# Patient Record
Sex: Female | Born: 1955 | Race: White | Hispanic: No | Smoking: Former smoker
Health system: Southern US, Community
[De-identification: ages and names within clinical notes are randomized; demographics above are authoritative.]

## PROBLEM LIST (undated history)

## (undated) DIAGNOSIS — D696 Thrombocytopenia, unspecified: Secondary | ICD-10-CM

## (undated) DIAGNOSIS — G4730 Sleep apnea, unspecified: Secondary | ICD-10-CM

## (undated) DIAGNOSIS — F32A Depression, unspecified: Secondary | ICD-10-CM

## (undated) DIAGNOSIS — M1990 Unspecified osteoarthritis, unspecified site: Secondary | ICD-10-CM

## (undated) DIAGNOSIS — N179 Acute kidney failure, unspecified: Secondary | ICD-10-CM

## (undated) DIAGNOSIS — D649 Anemia, unspecified: Secondary | ICD-10-CM

## (undated) DIAGNOSIS — F419 Anxiety disorder, unspecified: Secondary | ICD-10-CM

## (undated) DIAGNOSIS — Z87442 Personal history of urinary calculi: Secondary | ICD-10-CM

## (undated) DIAGNOSIS — I10 Essential (primary) hypertension: Secondary | ICD-10-CM

## (undated) DIAGNOSIS — J45909 Unspecified asthma, uncomplicated: Secondary | ICD-10-CM

## (undated) DIAGNOSIS — E871 Hypo-osmolality and hyponatremia: Secondary | ICD-10-CM

## (undated) DIAGNOSIS — E669 Obesity, unspecified: Secondary | ICD-10-CM

## (undated) DIAGNOSIS — N183 Chronic kidney disease, stage 3 unspecified: Secondary | ICD-10-CM

## (undated) DIAGNOSIS — F329 Major depressive disorder, single episode, unspecified: Secondary | ICD-10-CM

## (undated) DIAGNOSIS — J449 Chronic obstructive pulmonary disease, unspecified: Secondary | ICD-10-CM

## (undated) DIAGNOSIS — C801 Malignant (primary) neoplasm, unspecified: Secondary | ICD-10-CM

## (undated) DIAGNOSIS — R0600 Dyspnea, unspecified: Secondary | ICD-10-CM

## (undated) HISTORY — PX: ACHILLES TENDON REPAIR: SUR1153

## (undated) HISTORY — PX: JOINT REPLACEMENT: SHX530

## (undated) HISTORY — DX: Unspecified asthma, uncomplicated: J45.909

## (undated) HISTORY — PX: ABDOMINAL HYSTERECTOMY: SHX81

## (undated) HISTORY — PX: GASTRIC BYPASS: SHX52

---
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
## 1986-05-01 HISTORY — PX: ABDOMINAL HYSTERECTOMY: SHX81

## 2003-05-02 HISTORY — PX: FRACTURE SURGERY: SHX138

## 2005-05-01 HISTORY — PX: CARPAL TUNNEL RELEASE: SHX101

## 2009-10-19 ENCOUNTER — Emergency Department: Payer: Self-pay | Admitting: Emergency Medicine

## 2009-12-13 ENCOUNTER — Ambulatory Visit: Payer: Self-pay

## 2010-01-04 ENCOUNTER — Ambulatory Visit: Payer: Self-pay | Admitting: Pain Medicine

## 2010-01-17 ENCOUNTER — Ambulatory Visit: Payer: Self-pay | Admitting: Pain Medicine

## 2010-01-25 ENCOUNTER — Ambulatory Visit: Payer: Self-pay | Admitting: Pain Medicine

## 2010-02-08 ENCOUNTER — Ambulatory Visit: Payer: Self-pay | Admitting: Pain Medicine

## 2010-04-05 ENCOUNTER — Emergency Department (HOSPITAL_COMMUNITY)
Admission: EM | Admit: 2010-04-05 | Discharge: 2010-04-05 | Payer: Self-pay | Source: Home / Self Care | Admitting: Emergency Medicine

## 2010-04-19 ENCOUNTER — Ambulatory Visit: Payer: Self-pay

## 2010-06-14 ENCOUNTER — Ambulatory Visit: Payer: Self-pay | Admitting: Pain Medicine

## 2010-08-01 ENCOUNTER — Ambulatory Visit: Payer: Self-pay | Admitting: Pain Medicine

## 2010-08-05 ENCOUNTER — Inpatient Hospital Stay: Payer: Self-pay | Admitting: Psychiatry

## 2011-05-09 DIAGNOSIS — Z79899 Other long term (current) drug therapy: Secondary | ICD-10-CM | POA: Diagnosis not present

## 2011-05-09 DIAGNOSIS — Z6841 Body Mass Index (BMI) 40.0 and over, adult: Secondary | ICD-10-CM | POA: Diagnosis not present

## 2011-05-09 DIAGNOSIS — S8000XA Contusion of unspecified knee, initial encounter: Secondary | ICD-10-CM | POA: Diagnosis not present

## 2011-05-09 DIAGNOSIS — R5381 Other malaise: Secondary | ICD-10-CM | POA: Diagnosis not present

## 2011-05-24 DIAGNOSIS — R6884 Jaw pain: Secondary | ICD-10-CM | POA: Diagnosis not present

## 2011-05-24 DIAGNOSIS — K089 Disorder of teeth and supporting structures, unspecified: Secondary | ICD-10-CM | POA: Diagnosis not present

## 2011-05-25 DIAGNOSIS — M25569 Pain in unspecified knee: Secondary | ICD-10-CM | POA: Diagnosis not present

## 2011-05-25 DIAGNOSIS — M1710 Unilateral primary osteoarthritis, unspecified knee: Secondary | ICD-10-CM | POA: Diagnosis not present

## 2011-05-25 DIAGNOSIS — Z6841 Body Mass Index (BMI) 40.0 and over, adult: Secondary | ICD-10-CM | POA: Diagnosis not present

## 2011-05-30 DIAGNOSIS — L280 Lichen simplex chronicus: Secondary | ICD-10-CM | POA: Diagnosis not present

## 2011-05-30 DIAGNOSIS — L219 Seborrheic dermatitis, unspecified: Secondary | ICD-10-CM | POA: Diagnosis not present

## 2011-06-08 DIAGNOSIS — F331 Major depressive disorder, recurrent, moderate: Secondary | ICD-10-CM | POA: Diagnosis not present

## 2013-02-06 DIAGNOSIS — F312 Bipolar disorder, current episode manic severe with psychotic features: Secondary | ICD-10-CM | POA: Diagnosis not present

## 2013-02-17 DIAGNOSIS — F312 Bipolar disorder, current episode manic severe with psychotic features: Secondary | ICD-10-CM | POA: Diagnosis not present

## 2013-02-20 DIAGNOSIS — Z23 Encounter for immunization: Secondary | ICD-10-CM | POA: Diagnosis not present

## 2013-02-28 DIAGNOSIS — F319 Bipolar disorder, unspecified: Secondary | ICD-10-CM | POA: Diagnosis not present

## 2013-02-28 DIAGNOSIS — Z79899 Other long term (current) drug therapy: Secondary | ICD-10-CM | POA: Diagnosis not present

## 2013-03-17 DIAGNOSIS — F3132 Bipolar disorder, current episode depressed, moderate: Secondary | ICD-10-CM | POA: Diagnosis not present

## 2013-03-17 DIAGNOSIS — F312 Bipolar disorder, current episode manic severe with psychotic features: Secondary | ICD-10-CM | POA: Diagnosis not present

## 2013-05-12 DIAGNOSIS — F3132 Bipolar disorder, current episode depressed, moderate: Secondary | ICD-10-CM | POA: Diagnosis not present

## 2013-05-12 DIAGNOSIS — F312 Bipolar disorder, current episode manic severe with psychotic features: Secondary | ICD-10-CM | POA: Diagnosis not present

## 2013-05-14 DIAGNOSIS — H40019 Open angle with borderline findings, low risk, unspecified eye: Secondary | ICD-10-CM | POA: Diagnosis not present

## 2013-05-20 DIAGNOSIS — J111 Influenza due to unidentified influenza virus with other respiratory manifestations: Secondary | ICD-10-CM | POA: Diagnosis not present

## 2013-05-20 DIAGNOSIS — M13869 Other specified arthritis, unspecified knee: Secondary | ICD-10-CM | POA: Diagnosis not present

## 2013-06-05 DIAGNOSIS — E119 Type 2 diabetes mellitus without complications: Secondary | ICD-10-CM | POA: Diagnosis not present

## 2013-06-05 DIAGNOSIS — IMO0002 Reserved for concepts with insufficient information to code with codable children: Secondary | ICD-10-CM | POA: Diagnosis not present

## 2013-06-05 DIAGNOSIS — F3130 Bipolar disorder, current episode depressed, mild or moderate severity, unspecified: Secondary | ICD-10-CM | POA: Diagnosis not present

## 2013-06-05 DIAGNOSIS — J45909 Unspecified asthma, uncomplicated: Secondary | ICD-10-CM | POA: Diagnosis not present

## 2013-06-05 DIAGNOSIS — E559 Vitamin D deficiency, unspecified: Secondary | ICD-10-CM | POA: Diagnosis not present

## 2013-06-30 DIAGNOSIS — F3132 Bipolar disorder, current episode depressed, moderate: Secondary | ICD-10-CM | POA: Diagnosis not present

## 2013-06-30 DIAGNOSIS — F312 Bipolar disorder, current episode manic severe with psychotic features: Secondary | ICD-10-CM | POA: Diagnosis not present

## 2013-07-03 DIAGNOSIS — F3130 Bipolar disorder, current episode depressed, mild or moderate severity, unspecified: Secondary | ICD-10-CM | POA: Diagnosis not present

## 2013-07-03 DIAGNOSIS — M549 Dorsalgia, unspecified: Secondary | ICD-10-CM | POA: Diagnosis not present

## 2013-07-03 DIAGNOSIS — E119 Type 2 diabetes mellitus without complications: Secondary | ICD-10-CM | POA: Diagnosis not present

## 2013-07-03 DIAGNOSIS — J45909 Unspecified asthma, uncomplicated: Secondary | ICD-10-CM | POA: Diagnosis not present

## 2013-07-15 DIAGNOSIS — F3132 Bipolar disorder, current episode depressed, moderate: Secondary | ICD-10-CM | POA: Diagnosis not present

## 2013-07-15 DIAGNOSIS — F312 Bipolar disorder, current episode manic severe with psychotic features: Secondary | ICD-10-CM | POA: Diagnosis not present

## 2013-07-22 DIAGNOSIS — F3132 Bipolar disorder, current episode depressed, moderate: Secondary | ICD-10-CM | POA: Diagnosis not present

## 2013-07-22 DIAGNOSIS — F312 Bipolar disorder, current episode manic severe with psychotic features: Secondary | ICD-10-CM | POA: Diagnosis not present

## 2013-08-06 DIAGNOSIS — H40019 Open angle with borderline findings, low risk, unspecified eye: Secondary | ICD-10-CM | POA: Diagnosis not present

## 2013-08-15 DIAGNOSIS — F312 Bipolar disorder, current episode manic severe with psychotic features: Secondary | ICD-10-CM | POA: Diagnosis not present

## 2013-09-01 DIAGNOSIS — IMO0002 Reserved for concepts with insufficient information to code with codable children: Secondary | ICD-10-CM | POA: Diagnosis not present

## 2013-09-01 DIAGNOSIS — F3130 Bipolar disorder, current episode depressed, mild or moderate severity, unspecified: Secondary | ICD-10-CM | POA: Diagnosis not present

## 2013-09-01 DIAGNOSIS — M549 Dorsalgia, unspecified: Secondary | ICD-10-CM | POA: Diagnosis not present

## 2013-09-01 DIAGNOSIS — Z79899 Other long term (current) drug therapy: Secondary | ICD-10-CM | POA: Diagnosis not present

## 2013-09-02 DIAGNOSIS — F312 Bipolar disorder, current episode manic severe with psychotic features: Secondary | ICD-10-CM | POA: Diagnosis not present

## 2013-09-03 DIAGNOSIS — F319 Bipolar disorder, unspecified: Secondary | ICD-10-CM | POA: Diagnosis not present

## 2013-09-17 DIAGNOSIS — F312 Bipolar disorder, current episode manic severe with psychotic features: Secondary | ICD-10-CM | POA: Diagnosis not present

## 2013-10-22 DIAGNOSIS — F3130 Bipolar disorder, current episode depressed, mild or moderate severity, unspecified: Secondary | ICD-10-CM | POA: Diagnosis not present

## 2013-10-29 DIAGNOSIS — F3130 Bipolar disorder, current episode depressed, mild or moderate severity, unspecified: Secondary | ICD-10-CM | POA: Diagnosis not present

## 2013-11-04 DIAGNOSIS — M129 Arthropathy, unspecified: Secondary | ICD-10-CM | POA: Diagnosis not present

## 2013-11-04 DIAGNOSIS — F319 Bipolar disorder, unspecified: Secondary | ICD-10-CM | POA: Diagnosis not present

## 2013-11-04 DIAGNOSIS — R5381 Other malaise: Secondary | ICD-10-CM | POA: Diagnosis not present

## 2013-11-04 DIAGNOSIS — J45909 Unspecified asthma, uncomplicated: Secondary | ICD-10-CM | POA: Diagnosis not present

## 2013-11-04 DIAGNOSIS — G8929 Other chronic pain: Secondary | ICD-10-CM | POA: Diagnosis not present

## 2013-11-04 DIAGNOSIS — R5383 Other fatigue: Secondary | ICD-10-CM | POA: Diagnosis not present

## 2013-11-05 DIAGNOSIS — F3130 Bipolar disorder, current episode depressed, mild or moderate severity, unspecified: Secondary | ICD-10-CM | POA: Diagnosis not present

## 2013-11-07 DIAGNOSIS — R609 Edema, unspecified: Secondary | ICD-10-CM | POA: Diagnosis not present

## 2013-11-07 DIAGNOSIS — M7989 Other specified soft tissue disorders: Secondary | ICD-10-CM | POA: Diagnosis not present

## 2013-11-12 DIAGNOSIS — F3130 Bipolar disorder, current episode depressed, mild or moderate severity, unspecified: Secondary | ICD-10-CM | POA: Diagnosis not present

## 2013-11-19 DIAGNOSIS — F3130 Bipolar disorder, current episode depressed, mild or moderate severity, unspecified: Secondary | ICD-10-CM | POA: Diagnosis not present

## 2013-11-25 DIAGNOSIS — R0602 Shortness of breath: Secondary | ICD-10-CM | POA: Diagnosis not present

## 2013-11-25 DIAGNOSIS — R5383 Other fatigue: Secondary | ICD-10-CM | POA: Diagnosis not present

## 2013-11-25 DIAGNOSIS — Z79899 Other long term (current) drug therapy: Secondary | ICD-10-CM | POA: Diagnosis not present

## 2013-11-25 DIAGNOSIS — R5381 Other malaise: Secondary | ICD-10-CM | POA: Diagnosis not present

## 2013-11-25 DIAGNOSIS — G8929 Other chronic pain: Secondary | ICD-10-CM | POA: Diagnosis not present

## 2013-11-26 DIAGNOSIS — F3130 Bipolar disorder, current episode depressed, mild or moderate severity, unspecified: Secondary | ICD-10-CM | POA: Diagnosis not present

## 2013-12-05 DIAGNOSIS — M259 Joint disorder, unspecified: Secondary | ICD-10-CM | POA: Diagnosis not present

## 2013-12-12 DIAGNOSIS — S79929A Unspecified injury of unspecified thigh, initial encounter: Secondary | ICD-10-CM | POA: Diagnosis not present

## 2013-12-12 DIAGNOSIS — M7989 Other specified soft tissue disorders: Secondary | ICD-10-CM | POA: Diagnosis not present

## 2013-12-12 DIAGNOSIS — W010XXA Fall on same level from slipping, tripping and stumbling without subsequent striking against object, initial encounter: Secondary | ICD-10-CM | POA: Diagnosis not present

## 2013-12-12 DIAGNOSIS — M259 Joint disorder, unspecified: Secondary | ICD-10-CM | POA: Diagnosis not present

## 2013-12-12 DIAGNOSIS — S8000XA Contusion of unspecified knee, initial encounter: Secondary | ICD-10-CM | POA: Diagnosis not present

## 2013-12-12 DIAGNOSIS — Z96659 Presence of unspecified artificial knee joint: Secondary | ICD-10-CM | POA: Diagnosis not present

## 2013-12-12 DIAGNOSIS — M25559 Pain in unspecified hip: Secondary | ICD-10-CM | POA: Diagnosis not present

## 2013-12-12 DIAGNOSIS — S79919A Unspecified injury of unspecified hip, initial encounter: Secondary | ICD-10-CM | POA: Diagnosis not present

## 2013-12-12 DIAGNOSIS — M25579 Pain in unspecified ankle and joints of unspecified foot: Secondary | ICD-10-CM | POA: Diagnosis not present

## 2013-12-12 DIAGNOSIS — M25569 Pain in unspecified knee: Secondary | ICD-10-CM | POA: Diagnosis not present

## 2013-12-12 DIAGNOSIS — S8990XA Unspecified injury of unspecified lower leg, initial encounter: Secondary | ICD-10-CM | POA: Diagnosis not present

## 2013-12-12 DIAGNOSIS — S99919A Unspecified injury of unspecified ankle, initial encounter: Secondary | ICD-10-CM | POA: Diagnosis not present

## 2013-12-15 DIAGNOSIS — M7660 Achilles tendinitis, unspecified leg: Secondary | ICD-10-CM | POA: Diagnosis not present

## 2013-12-15 DIAGNOSIS — M259 Joint disorder, unspecified: Secondary | ICD-10-CM | POA: Diagnosis not present

## 2013-12-18 DIAGNOSIS — F3130 Bipolar disorder, current episode depressed, mild or moderate severity, unspecified: Secondary | ICD-10-CM | POA: Diagnosis not present

## 2013-12-23 DIAGNOSIS — G4710 Hypersomnia, unspecified: Secondary | ICD-10-CM | POA: Diagnosis not present

## 2013-12-23 DIAGNOSIS — M7660 Achilles tendinitis, unspecified leg: Secondary | ICD-10-CM | POA: Diagnosis not present

## 2013-12-23 DIAGNOSIS — G8929 Other chronic pain: Secondary | ICD-10-CM | POA: Diagnosis not present

## 2013-12-23 DIAGNOSIS — R5383 Other fatigue: Secondary | ICD-10-CM | POA: Diagnosis not present

## 2013-12-23 DIAGNOSIS — R5381 Other malaise: Secondary | ICD-10-CM | POA: Diagnosis not present

## 2013-12-23 DIAGNOSIS — N289 Disorder of kidney and ureter, unspecified: Secondary | ICD-10-CM | POA: Diagnosis not present

## 2013-12-26 DIAGNOSIS — M949 Disorder of cartilage, unspecified: Secondary | ICD-10-CM | POA: Diagnosis not present

## 2013-12-26 DIAGNOSIS — Z1231 Encounter for screening mammogram for malignant neoplasm of breast: Secondary | ICD-10-CM | POA: Diagnosis not present

## 2013-12-26 DIAGNOSIS — N951 Menopausal and female climacteric states: Secondary | ICD-10-CM | POA: Diagnosis not present

## 2013-12-26 DIAGNOSIS — M899 Disorder of bone, unspecified: Secondary | ICD-10-CM | POA: Diagnosis not present

## 2013-12-27 DIAGNOSIS — G4730 Sleep apnea, unspecified: Secondary | ICD-10-CM | POA: Diagnosis not present

## 2013-12-27 DIAGNOSIS — G4710 Hypersomnia, unspecified: Secondary | ICD-10-CM | POA: Diagnosis not present

## 2013-12-30 DIAGNOSIS — M7660 Achilles tendinitis, unspecified leg: Secondary | ICD-10-CM | POA: Diagnosis not present

## 2014-01-01 DIAGNOSIS — M7660 Achilles tendinitis, unspecified leg: Secondary | ICD-10-CM | POA: Diagnosis not present

## 2014-01-02 DIAGNOSIS — J069 Acute upper respiratory infection, unspecified: Secondary | ICD-10-CM | POA: Diagnosis not present

## 2014-01-02 DIAGNOSIS — R05 Cough: Secondary | ICD-10-CM | POA: Diagnosis not present

## 2014-01-02 DIAGNOSIS — G4730 Sleep apnea, unspecified: Secondary | ICD-10-CM | POA: Diagnosis not present

## 2014-01-02 DIAGNOSIS — R059 Cough, unspecified: Secondary | ICD-10-CM | POA: Diagnosis not present

## 2014-01-08 DIAGNOSIS — M7660 Achilles tendinitis, unspecified leg: Secondary | ICD-10-CM | POA: Diagnosis not present

## 2014-01-13 DIAGNOSIS — M7660 Achilles tendinitis, unspecified leg: Secondary | ICD-10-CM | POA: Diagnosis not present

## 2014-01-15 DIAGNOSIS — M7660 Achilles tendinitis, unspecified leg: Secondary | ICD-10-CM | POA: Diagnosis not present

## 2014-01-20 DIAGNOSIS — M7660 Achilles tendinitis, unspecified leg: Secondary | ICD-10-CM | POA: Diagnosis not present

## 2014-01-22 DIAGNOSIS — M7660 Achilles tendinitis, unspecified leg: Secondary | ICD-10-CM | POA: Diagnosis not present

## 2014-01-22 DIAGNOSIS — M25579 Pain in unspecified ankle and joints of unspecified foot: Secondary | ICD-10-CM | POA: Diagnosis not present

## 2014-01-23 DIAGNOSIS — Z79899 Other long term (current) drug therapy: Secondary | ICD-10-CM | POA: Diagnosis not present

## 2014-01-23 DIAGNOSIS — G8929 Other chronic pain: Secondary | ICD-10-CM | POA: Diagnosis not present

## 2014-01-23 DIAGNOSIS — Z6841 Body Mass Index (BMI) 40.0 and over, adult: Secondary | ICD-10-CM | POA: Diagnosis not present

## 2014-01-23 DIAGNOSIS — G4730 Sleep apnea, unspecified: Secondary | ICD-10-CM | POA: Diagnosis not present

## 2014-01-27 DIAGNOSIS — M7660 Achilles tendinitis, unspecified leg: Secondary | ICD-10-CM | POA: Diagnosis not present

## 2014-01-27 DIAGNOSIS — M25579 Pain in unspecified ankle and joints of unspecified foot: Secondary | ICD-10-CM | POA: Diagnosis not present

## 2014-01-29 DIAGNOSIS — R2241 Localized swelling, mass and lump, right lower limb: Secondary | ICD-10-CM | POA: Diagnosis not present

## 2014-01-29 DIAGNOSIS — M7661 Achilles tendinitis, right leg: Secondary | ICD-10-CM | POA: Diagnosis not present

## 2014-01-29 DIAGNOSIS — M25571 Pain in right ankle and joints of right foot: Secondary | ICD-10-CM | POA: Diagnosis not present

## 2014-02-03 DIAGNOSIS — M25571 Pain in right ankle and joints of right foot: Secondary | ICD-10-CM | POA: Diagnosis not present

## 2014-02-03 DIAGNOSIS — M7661 Achilles tendinitis, right leg: Secondary | ICD-10-CM | POA: Diagnosis not present

## 2014-02-05 DIAGNOSIS — M7661 Achilles tendinitis, right leg: Secondary | ICD-10-CM | POA: Diagnosis not present

## 2014-02-05 DIAGNOSIS — M25571 Pain in right ankle and joints of right foot: Secondary | ICD-10-CM | POA: Diagnosis not present

## 2014-02-10 DIAGNOSIS — H2513 Age-related nuclear cataract, bilateral: Secondary | ICD-10-CM | POA: Diagnosis not present

## 2014-02-11 DIAGNOSIS — S8011XA Contusion of right lower leg, initial encounter: Secondary | ICD-10-CM | POA: Diagnosis not present

## 2014-02-11 DIAGNOSIS — M79604 Pain in right leg: Secondary | ICD-10-CM | POA: Diagnosis not present

## 2014-02-11 DIAGNOSIS — W010XXA Fall on same level from slipping, tripping and stumbling without subsequent striking against object, initial encounter: Secondary | ICD-10-CM | POA: Diagnosis not present

## 2014-02-11 DIAGNOSIS — S8991XA Unspecified injury of right lower leg, initial encounter: Secondary | ICD-10-CM | POA: Diagnosis not present

## 2014-02-11 DIAGNOSIS — R262 Difficulty in walking, not elsewhere classified: Secondary | ICD-10-CM | POA: Diagnosis not present

## 2014-02-11 DIAGNOSIS — M7660 Achilles tendinitis, unspecified leg: Secondary | ICD-10-CM | POA: Diagnosis not present

## 2014-02-12 DIAGNOSIS — M7661 Achilles tendinitis, right leg: Secondary | ICD-10-CM | POA: Diagnosis not present

## 2014-02-12 DIAGNOSIS — M25571 Pain in right ankle and joints of right foot: Secondary | ICD-10-CM | POA: Diagnosis not present

## 2014-02-16 DIAGNOSIS — M25571 Pain in right ankle and joints of right foot: Secondary | ICD-10-CM | POA: Diagnosis not present

## 2014-02-16 DIAGNOSIS — M7661 Achilles tendinitis, right leg: Secondary | ICD-10-CM | POA: Diagnosis not present

## 2014-02-18 DIAGNOSIS — M7661 Achilles tendinitis, right leg: Secondary | ICD-10-CM | POA: Diagnosis not present

## 2014-02-18 DIAGNOSIS — M25571 Pain in right ankle and joints of right foot: Secondary | ICD-10-CM | POA: Diagnosis not present

## 2014-02-23 DIAGNOSIS — F319 Bipolar disorder, unspecified: Secondary | ICD-10-CM | POA: Diagnosis not present

## 2014-02-24 DIAGNOSIS — M25571 Pain in right ankle and joints of right foot: Secondary | ICD-10-CM | POA: Diagnosis not present

## 2014-02-24 DIAGNOSIS — M7661 Achilles tendinitis, right leg: Secondary | ICD-10-CM | POA: Diagnosis not present

## 2014-02-25 DIAGNOSIS — Z6841 Body Mass Index (BMI) 40.0 and over, adult: Secondary | ICD-10-CM | POA: Diagnosis not present

## 2014-02-25 DIAGNOSIS — Z23 Encounter for immunization: Secondary | ICD-10-CM | POA: Diagnosis not present

## 2014-02-25 DIAGNOSIS — Z79899 Other long term (current) drug therapy: Secondary | ICD-10-CM | POA: Diagnosis not present

## 2014-02-25 DIAGNOSIS — G4730 Sleep apnea, unspecified: Secondary | ICD-10-CM | POA: Diagnosis not present

## 2014-02-25 DIAGNOSIS — I10 Essential (primary) hypertension: Secondary | ICD-10-CM | POA: Diagnosis not present

## 2014-02-25 DIAGNOSIS — M1990 Unspecified osteoarthritis, unspecified site: Secondary | ICD-10-CM | POA: Diagnosis not present

## 2014-02-26 DIAGNOSIS — M25571 Pain in right ankle and joints of right foot: Secondary | ICD-10-CM | POA: Diagnosis not present

## 2014-02-26 DIAGNOSIS — M7661 Achilles tendinitis, right leg: Secondary | ICD-10-CM | POA: Diagnosis not present

## 2014-03-02 DIAGNOSIS — M7661 Achilles tendinitis, right leg: Secondary | ICD-10-CM | POA: Diagnosis not present

## 2014-03-02 DIAGNOSIS — M25571 Pain in right ankle and joints of right foot: Secondary | ICD-10-CM | POA: Diagnosis not present

## 2014-03-05 DIAGNOSIS — M25571 Pain in right ankle and joints of right foot: Secondary | ICD-10-CM | POA: Diagnosis not present

## 2014-03-05 DIAGNOSIS — M7661 Achilles tendinitis, right leg: Secondary | ICD-10-CM | POA: Diagnosis not present

## 2014-03-25 DIAGNOSIS — M1990 Unspecified osteoarthritis, unspecified site: Secondary | ICD-10-CM | POA: Diagnosis not present

## 2014-03-25 DIAGNOSIS — Z6841 Body Mass Index (BMI) 40.0 and over, adult: Secondary | ICD-10-CM | POA: Diagnosis not present

## 2014-03-25 DIAGNOSIS — Z79899 Other long term (current) drug therapy: Secondary | ICD-10-CM | POA: Diagnosis not present

## 2014-03-25 DIAGNOSIS — L989 Disorder of the skin and subcutaneous tissue, unspecified: Secondary | ICD-10-CM | POA: Diagnosis not present

## 2014-03-28 DIAGNOSIS — J449 Chronic obstructive pulmonary disease, unspecified: Secondary | ICD-10-CM | POA: Diagnosis not present

## 2014-03-28 DIAGNOSIS — L723 Sebaceous cyst: Secondary | ICD-10-CM | POA: Diagnosis not present

## 2014-03-30 DIAGNOSIS — F319 Bipolar disorder, unspecified: Secondary | ICD-10-CM | POA: Diagnosis not present

## 2014-03-30 DIAGNOSIS — L723 Sebaceous cyst: Secondary | ICD-10-CM | POA: Diagnosis not present

## 2014-03-30 DIAGNOSIS — Z6841 Body Mass Index (BMI) 40.0 and over, adult: Secondary | ICD-10-CM | POA: Diagnosis not present

## 2014-03-30 DIAGNOSIS — B373 Candidiasis of vulva and vagina: Secondary | ICD-10-CM | POA: Diagnosis not present

## 2014-03-30 DIAGNOSIS — R300 Dysuria: Secondary | ICD-10-CM | POA: Diagnosis not present

## 2014-04-01 DIAGNOSIS — L723 Sebaceous cyst: Secondary | ICD-10-CM | POA: Diagnosis not present

## 2014-04-01 DIAGNOSIS — G4730 Sleep apnea, unspecified: Secondary | ICD-10-CM | POA: Diagnosis not present

## 2014-04-06 DIAGNOSIS — J209 Acute bronchitis, unspecified: Secondary | ICD-10-CM | POA: Diagnosis not present

## 2014-04-14 DIAGNOSIS — J4520 Mild intermittent asthma, uncomplicated: Secondary | ICD-10-CM | POA: Diagnosis not present

## 2014-04-14 DIAGNOSIS — M1990 Unspecified osteoarthritis, unspecified site: Secondary | ICD-10-CM | POA: Diagnosis not present

## 2014-04-14 DIAGNOSIS — Z6841 Body Mass Index (BMI) 40.0 and over, adult: Secondary | ICD-10-CM | POA: Diagnosis not present

## 2014-04-14 DIAGNOSIS — Z79899 Other long term (current) drug therapy: Secondary | ICD-10-CM | POA: Diagnosis not present

## 2014-04-14 DIAGNOSIS — F319 Bipolar disorder, unspecified: Secondary | ICD-10-CM | POA: Diagnosis not present

## 2014-04-14 DIAGNOSIS — J3089 Other allergic rhinitis: Secondary | ICD-10-CM | POA: Diagnosis not present

## 2014-05-18 DIAGNOSIS — M1990 Unspecified osteoarthritis, unspecified site: Secondary | ICD-10-CM | POA: Diagnosis not present

## 2014-05-18 DIAGNOSIS — J3089 Other allergic rhinitis: Secondary | ICD-10-CM | POA: Diagnosis not present

## 2014-05-18 DIAGNOSIS — Z6841 Body Mass Index (BMI) 40.0 and over, adult: Secondary | ICD-10-CM | POA: Diagnosis not present

## 2014-05-18 DIAGNOSIS — Z79899 Other long term (current) drug therapy: Secondary | ICD-10-CM | POA: Diagnosis not present

## 2014-05-27 DIAGNOSIS — R05 Cough: Secondary | ICD-10-CM | POA: Diagnosis not present

## 2014-05-27 DIAGNOSIS — Z6841 Body Mass Index (BMI) 40.0 and over, adult: Secondary | ICD-10-CM | POA: Diagnosis not present

## 2014-05-27 DIAGNOSIS — J069 Acute upper respiratory infection, unspecified: Secondary | ICD-10-CM | POA: Diagnosis not present

## 2014-06-02 DIAGNOSIS — J309 Allergic rhinitis, unspecified: Secondary | ICD-10-CM | POA: Diagnosis not present

## 2014-06-02 DIAGNOSIS — J342 Deviated nasal septum: Secondary | ICD-10-CM | POA: Diagnosis not present

## 2014-06-02 DIAGNOSIS — Z8709 Personal history of other diseases of the respiratory system: Secondary | ICD-10-CM | POA: Diagnosis not present

## 2014-06-04 DIAGNOSIS — F319 Bipolar disorder, unspecified: Secondary | ICD-10-CM | POA: Diagnosis not present

## 2014-06-08 DIAGNOSIS — Z79899 Other long term (current) drug therapy: Secondary | ICD-10-CM | POA: Diagnosis not present

## 2014-06-26 DIAGNOSIS — M8580 Other specified disorders of bone density and structure, unspecified site: Secondary | ICD-10-CM | POA: Diagnosis not present

## 2014-06-26 DIAGNOSIS — M1990 Unspecified osteoarthritis, unspecified site: Secondary | ICD-10-CM | POA: Diagnosis not present

## 2014-06-26 DIAGNOSIS — N289 Disorder of kidney and ureter, unspecified: Secondary | ICD-10-CM | POA: Diagnosis not present

## 2014-06-26 DIAGNOSIS — J4520 Mild intermittent asthma, uncomplicated: Secondary | ICD-10-CM | POA: Diagnosis not present

## 2014-06-26 DIAGNOSIS — Z6839 Body mass index (BMI) 39.0-39.9, adult: Secondary | ICD-10-CM | POA: Diagnosis not present

## 2014-06-26 DIAGNOSIS — J3089 Other allergic rhinitis: Secondary | ICD-10-CM | POA: Diagnosis not present

## 2014-06-26 DIAGNOSIS — Z79899 Other long term (current) drug therapy: Secondary | ICD-10-CM | POA: Diagnosis not present

## 2014-07-11 DIAGNOSIS — A088 Other specified intestinal infections: Secondary | ICD-10-CM | POA: Diagnosis not present

## 2014-07-27 DIAGNOSIS — M1990 Unspecified osteoarthritis, unspecified site: Secondary | ICD-10-CM | POA: Diagnosis not present

## 2014-07-27 DIAGNOSIS — M8580 Other specified disorders of bone density and structure, unspecified site: Secondary | ICD-10-CM | POA: Diagnosis not present

## 2014-07-27 DIAGNOSIS — Z6839 Body mass index (BMI) 39.0-39.9, adult: Secondary | ICD-10-CM | POA: Diagnosis not present

## 2014-07-27 DIAGNOSIS — Z79899 Other long term (current) drug therapy: Secondary | ICD-10-CM | POA: Diagnosis not present

## 2014-07-27 DIAGNOSIS — J4520 Mild intermittent asthma, uncomplicated: Secondary | ICD-10-CM | POA: Diagnosis not present

## 2014-08-03 DIAGNOSIS — Z79899 Other long term (current) drug therapy: Secondary | ICD-10-CM | POA: Diagnosis not present

## 2014-08-03 DIAGNOSIS — J449 Chronic obstructive pulmonary disease, unspecified: Secondary | ICD-10-CM | POA: Diagnosis not present

## 2014-08-03 DIAGNOSIS — N390 Urinary tract infection, site not specified: Secondary | ICD-10-CM | POA: Diagnosis not present

## 2014-08-03 DIAGNOSIS — R45851 Suicidal ideations: Secondary | ICD-10-CM | POA: Diagnosis not present

## 2014-08-03 DIAGNOSIS — F329 Major depressive disorder, single episode, unspecified: Secondary | ICD-10-CM | POA: Diagnosis not present

## 2014-08-03 DIAGNOSIS — F3110 Bipolar disorder, current episode manic without psychotic features, unspecified: Secondary | ICD-10-CM | POA: Diagnosis not present

## 2014-08-03 DIAGNOSIS — F321 Major depressive disorder, single episode, moderate: Secondary | ICD-10-CM | POA: Diagnosis not present

## 2014-08-05 DIAGNOSIS — F329 Major depressive disorder, single episode, unspecified: Secondary | ICD-10-CM | POA: Diagnosis not present

## 2014-08-06 DIAGNOSIS — F329 Major depressive disorder, single episode, unspecified: Secondary | ICD-10-CM | POA: Diagnosis not present

## 2014-08-07 DIAGNOSIS — F329 Major depressive disorder, single episode, unspecified: Secondary | ICD-10-CM | POA: Diagnosis not present

## 2014-08-08 DIAGNOSIS — F329 Major depressive disorder, single episode, unspecified: Secondary | ICD-10-CM | POA: Diagnosis not present

## 2014-08-09 DIAGNOSIS — F329 Major depressive disorder, single episode, unspecified: Secondary | ICD-10-CM | POA: Diagnosis not present

## 2014-08-10 DIAGNOSIS — F329 Major depressive disorder, single episode, unspecified: Secondary | ICD-10-CM | POA: Diagnosis not present

## 2014-08-10 DIAGNOSIS — F1490 Cocaine use, unspecified, uncomplicated: Secondary | ICD-10-CM | POA: Insufficient documentation

## 2014-08-10 DIAGNOSIS — F1411 Cocaine abuse, in remission: Secondary | ICD-10-CM

## 2014-08-10 DIAGNOSIS — F3130 Bipolar disorder, current episode depressed, mild or moderate severity, unspecified: Secondary | ICD-10-CM

## 2014-08-10 HISTORY — DX: Bipolar disorder, current episode depressed, mild or moderate severity, unspecified: F31.30

## 2014-08-10 HISTORY — DX: Cocaine abuse, in remission: F14.11

## 2014-08-18 DIAGNOSIS — F3181 Bipolar II disorder: Secondary | ICD-10-CM | POA: Diagnosis not present

## 2014-08-25 DIAGNOSIS — H40013 Open angle with borderline findings, low risk, bilateral: Secondary | ICD-10-CM | POA: Diagnosis not present

## 2014-08-25 DIAGNOSIS — H2513 Age-related nuclear cataract, bilateral: Secondary | ICD-10-CM | POA: Diagnosis not present

## 2014-08-31 DIAGNOSIS — G894 Chronic pain syndrome: Secondary | ICD-10-CM | POA: Diagnosis not present

## 2014-08-31 DIAGNOSIS — F3130 Bipolar disorder, current episode depressed, mild or moderate severity, unspecified: Secondary | ICD-10-CM | POA: Diagnosis not present

## 2014-08-31 DIAGNOSIS — F4310 Post-traumatic stress disorder, unspecified: Secondary | ICD-10-CM | POA: Diagnosis not present

## 2014-08-31 DIAGNOSIS — F209 Schizophrenia, unspecified: Secondary | ICD-10-CM | POA: Diagnosis not present

## 2014-09-18 DIAGNOSIS — F312 Bipolar disorder, current episode manic severe with psychotic features: Secondary | ICD-10-CM | POA: Diagnosis not present

## 2014-09-18 DIAGNOSIS — F3181 Bipolar II disorder: Secondary | ICD-10-CM | POA: Diagnosis not present

## 2014-10-05 DIAGNOSIS — M169 Osteoarthritis of hip, unspecified: Secondary | ICD-10-CM | POA: Diagnosis not present

## 2014-10-05 DIAGNOSIS — F209 Schizophrenia, unspecified: Secondary | ICD-10-CM | POA: Diagnosis not present

## 2014-10-05 DIAGNOSIS — D509 Iron deficiency anemia, unspecified: Secondary | ICD-10-CM | POA: Diagnosis not present

## 2014-10-05 DIAGNOSIS — M549 Dorsalgia, unspecified: Secondary | ICD-10-CM | POA: Diagnosis not present

## 2014-10-16 DIAGNOSIS — F3181 Bipolar II disorder: Secondary | ICD-10-CM | POA: Diagnosis not present

## 2014-10-16 DIAGNOSIS — F312 Bipolar disorder, current episode manic severe with psychotic features: Secondary | ICD-10-CM | POA: Diagnosis not present

## 2014-10-29 DIAGNOSIS — M810 Age-related osteoporosis without current pathological fracture: Secondary | ICD-10-CM | POA: Diagnosis not present

## 2014-10-29 DIAGNOSIS — M5489 Other dorsalgia: Secondary | ICD-10-CM | POA: Diagnosis not present

## 2014-10-29 DIAGNOSIS — J309 Allergic rhinitis, unspecified: Secondary | ICD-10-CM | POA: Diagnosis not present

## 2014-11-06 DIAGNOSIS — F312 Bipolar disorder, current episode manic severe with psychotic features: Secondary | ICD-10-CM | POA: Diagnosis not present

## 2014-11-06 DIAGNOSIS — F3181 Bipolar II disorder: Secondary | ICD-10-CM | POA: Diagnosis not present

## 2014-12-14 DIAGNOSIS — Z79899 Other long term (current) drug therapy: Secondary | ICD-10-CM | POA: Diagnosis not present

## 2014-12-14 DIAGNOSIS — J4520 Mild intermittent asthma, uncomplicated: Secondary | ICD-10-CM | POA: Diagnosis not present

## 2014-12-14 DIAGNOSIS — Z6837 Body mass index (BMI) 37.0-37.9, adult: Secondary | ICD-10-CM | POA: Diagnosis not present

## 2014-12-14 DIAGNOSIS — M1990 Unspecified osteoarthritis, unspecified site: Secondary | ICD-10-CM | POA: Diagnosis not present

## 2015-01-29 DIAGNOSIS — Z23 Encounter for immunization: Secondary | ICD-10-CM | POA: Diagnosis not present

## 2015-01-29 DIAGNOSIS — M8580 Other specified disorders of bone density and structure, unspecified site: Secondary | ICD-10-CM | POA: Diagnosis not present

## 2015-01-29 DIAGNOSIS — J4520 Mild intermittent asthma, uncomplicated: Secondary | ICD-10-CM | POA: Diagnosis not present

## 2015-01-29 DIAGNOSIS — J3089 Other allergic rhinitis: Secondary | ICD-10-CM | POA: Diagnosis not present

## 2015-01-29 DIAGNOSIS — Z79899 Other long term (current) drug therapy: Secondary | ICD-10-CM | POA: Diagnosis not present

## 2015-01-29 DIAGNOSIS — Z1231 Encounter for screening mammogram for malignant neoplasm of breast: Secondary | ICD-10-CM | POA: Diagnosis not present

## 2015-01-29 DIAGNOSIS — Z6837 Body mass index (BMI) 37.0-37.9, adult: Secondary | ICD-10-CM | POA: Diagnosis not present

## 2015-01-29 DIAGNOSIS — M1990 Unspecified osteoarthritis, unspecified site: Secondary | ICD-10-CM | POA: Diagnosis not present

## 2015-02-12 DIAGNOSIS — Z1231 Encounter for screening mammogram for malignant neoplasm of breast: Secondary | ICD-10-CM | POA: Diagnosis not present

## 2015-02-24 DIAGNOSIS — F319 Bipolar disorder, unspecified: Secondary | ICD-10-CM | POA: Diagnosis not present

## 2015-03-09 DIAGNOSIS — R0602 Shortness of breath: Secondary | ICD-10-CM | POA: Diagnosis not present

## 2015-03-09 DIAGNOSIS — F319 Bipolar disorder, unspecified: Secondary | ICD-10-CM | POA: Diagnosis not present

## 2015-03-09 DIAGNOSIS — Z79899 Other long term (current) drug therapy: Secondary | ICD-10-CM | POA: Diagnosis not present

## 2015-03-09 DIAGNOSIS — M1990 Unspecified osteoarthritis, unspecified site: Secondary | ICD-10-CM | POA: Diagnosis not present

## 2015-03-09 DIAGNOSIS — E663 Overweight: Secondary | ICD-10-CM | POA: Diagnosis not present

## 2015-03-09 DIAGNOSIS — Z6839 Body mass index (BMI) 39.0-39.9, adult: Secondary | ICD-10-CM | POA: Diagnosis not present

## 2015-03-09 DIAGNOSIS — J4520 Mild intermittent asthma, uncomplicated: Secondary | ICD-10-CM | POA: Diagnosis not present

## 2015-03-24 DIAGNOSIS — Z96651 Presence of right artificial knee joint: Secondary | ICD-10-CM | POA: Diagnosis not present

## 2015-03-24 DIAGNOSIS — J449 Chronic obstructive pulmonary disease, unspecified: Secondary | ICD-10-CM | POA: Diagnosis not present

## 2015-03-24 DIAGNOSIS — F17200 Nicotine dependence, unspecified, uncomplicated: Secondary | ICD-10-CM | POA: Diagnosis not present

## 2015-03-24 DIAGNOSIS — M25561 Pain in right knee: Secondary | ICD-10-CM | POA: Diagnosis not present

## 2015-04-12 DIAGNOSIS — J4520 Mild intermittent asthma, uncomplicated: Secondary | ICD-10-CM | POA: Diagnosis not present

## 2015-04-12 DIAGNOSIS — Z79899 Other long term (current) drug therapy: Secondary | ICD-10-CM | POA: Diagnosis not present

## 2015-04-12 DIAGNOSIS — M1990 Unspecified osteoarthritis, unspecified site: Secondary | ICD-10-CM | POA: Diagnosis not present

## 2015-04-12 DIAGNOSIS — J069 Acute upper respiratory infection, unspecified: Secondary | ICD-10-CM | POA: Diagnosis not present

## 2015-04-12 DIAGNOSIS — Z6841 Body Mass Index (BMI) 40.0 and over, adult: Secondary | ICD-10-CM | POA: Diagnosis not present

## 2015-05-05 DIAGNOSIS — M4306 Spondylolysis, lumbar region: Secondary | ICD-10-CM | POA: Diagnosis not present

## 2015-05-05 DIAGNOSIS — G894 Chronic pain syndrome: Secondary | ICD-10-CM | POA: Diagnosis not present

## 2015-05-05 DIAGNOSIS — M159 Polyosteoarthritis, unspecified: Secondary | ICD-10-CM | POA: Diagnosis not present

## 2015-05-05 DIAGNOSIS — F419 Anxiety disorder, unspecified: Secondary | ICD-10-CM | POA: Diagnosis not present

## 2015-05-05 DIAGNOSIS — Z720 Tobacco use: Secondary | ICD-10-CM | POA: Diagnosis not present

## 2015-05-05 DIAGNOSIS — M47816 Spondylosis without myelopathy or radiculopathy, lumbar region: Secondary | ICD-10-CM | POA: Diagnosis not present

## 2015-05-05 DIAGNOSIS — M47812 Spondylosis without myelopathy or radiculopathy, cervical region: Secondary | ICD-10-CM | POA: Diagnosis not present

## 2015-05-05 DIAGNOSIS — M545 Low back pain: Secondary | ICD-10-CM | POA: Diagnosis not present

## 2015-05-16 DIAGNOSIS — J019 Acute sinusitis, unspecified: Secondary | ICD-10-CM | POA: Diagnosis not present

## 2015-05-16 DIAGNOSIS — J209 Acute bronchitis, unspecified: Secondary | ICD-10-CM | POA: Diagnosis not present

## 2015-05-25 DIAGNOSIS — F319 Bipolar disorder, unspecified: Secondary | ICD-10-CM | POA: Diagnosis not present

## 2015-06-04 DIAGNOSIS — Z720 Tobacco use: Secondary | ICD-10-CM | POA: Diagnosis not present

## 2015-06-04 DIAGNOSIS — M545 Low back pain: Secondary | ICD-10-CM | POA: Diagnosis not present

## 2015-06-04 DIAGNOSIS — M47816 Spondylosis without myelopathy or radiculopathy, lumbar region: Secondary | ICD-10-CM | POA: Diagnosis not present

## 2015-06-04 DIAGNOSIS — M47812 Spondylosis without myelopathy or radiculopathy, cervical region: Secondary | ICD-10-CM | POA: Diagnosis not present

## 2015-06-04 DIAGNOSIS — M159 Polyosteoarthritis, unspecified: Secondary | ICD-10-CM | POA: Diagnosis not present

## 2015-06-04 DIAGNOSIS — G894 Chronic pain syndrome: Secondary | ICD-10-CM | POA: Diagnosis not present

## 2015-06-04 DIAGNOSIS — F419 Anxiety disorder, unspecified: Secondary | ICD-10-CM | POA: Diagnosis not present

## 2015-06-14 DIAGNOSIS — M159 Polyosteoarthritis, unspecified: Secondary | ICD-10-CM | POA: Diagnosis not present

## 2015-06-14 DIAGNOSIS — M545 Low back pain: Secondary | ICD-10-CM | POA: Diagnosis not present

## 2015-06-14 DIAGNOSIS — M47816 Spondylosis without myelopathy or radiculopathy, lumbar region: Secondary | ICD-10-CM | POA: Diagnosis not present

## 2015-06-14 DIAGNOSIS — Z720 Tobacco use: Secondary | ICD-10-CM | POA: Diagnosis not present

## 2015-06-14 DIAGNOSIS — M47812 Spondylosis without myelopathy or radiculopathy, cervical region: Secondary | ICD-10-CM | POA: Diagnosis not present

## 2015-06-14 DIAGNOSIS — G894 Chronic pain syndrome: Secondary | ICD-10-CM | POA: Diagnosis not present

## 2015-06-14 DIAGNOSIS — F419 Anxiety disorder, unspecified: Secondary | ICD-10-CM | POA: Diagnosis not present

## 2015-06-17 DIAGNOSIS — M25561 Pain in right knee: Secondary | ICD-10-CM | POA: Diagnosis not present

## 2015-06-17 DIAGNOSIS — Z6841 Body Mass Index (BMI) 40.0 and over, adult: Secondary | ICD-10-CM | POA: Diagnosis not present

## 2015-06-17 DIAGNOSIS — R030 Elevated blood-pressure reading, without diagnosis of hypertension: Secondary | ICD-10-CM | POA: Diagnosis not present

## 2015-06-17 DIAGNOSIS — N289 Disorder of kidney and ureter, unspecified: Secondary | ICD-10-CM | POA: Diagnosis not present

## 2015-07-02 DIAGNOSIS — F419 Anxiety disorder, unspecified: Secondary | ICD-10-CM | POA: Diagnosis not present

## 2015-07-02 DIAGNOSIS — M159 Polyosteoarthritis, unspecified: Secondary | ICD-10-CM | POA: Diagnosis not present

## 2015-07-02 DIAGNOSIS — Z720 Tobacco use: Secondary | ICD-10-CM | POA: Diagnosis not present

## 2015-07-02 DIAGNOSIS — M47812 Spondylosis without myelopathy or radiculopathy, cervical region: Secondary | ICD-10-CM | POA: Diagnosis not present

## 2015-07-02 DIAGNOSIS — M545 Low back pain: Secondary | ICD-10-CM | POA: Diagnosis not present

## 2015-07-02 DIAGNOSIS — M47816 Spondylosis without myelopathy or radiculopathy, lumbar region: Secondary | ICD-10-CM | POA: Diagnosis not present

## 2015-07-02 DIAGNOSIS — G894 Chronic pain syndrome: Secondary | ICD-10-CM | POA: Diagnosis not present

## 2015-07-08 DIAGNOSIS — M1712 Unilateral primary osteoarthritis, left knee: Secondary | ICD-10-CM | POA: Diagnosis not present

## 2015-07-09 DIAGNOSIS — E785 Hyperlipidemia, unspecified: Secondary | ICD-10-CM | POA: Diagnosis not present

## 2015-07-09 DIAGNOSIS — J3089 Other allergic rhinitis: Secondary | ICD-10-CM | POA: Diagnosis not present

## 2015-07-09 DIAGNOSIS — M8580 Other specified disorders of bone density and structure, unspecified site: Secondary | ICD-10-CM | POA: Diagnosis not present

## 2015-07-09 DIAGNOSIS — Z1211 Encounter for screening for malignant neoplasm of colon: Secondary | ICD-10-CM | POA: Diagnosis not present

## 2015-07-09 DIAGNOSIS — Z6841 Body Mass Index (BMI) 40.0 and over, adult: Secondary | ICD-10-CM | POA: Diagnosis not present

## 2015-07-09 DIAGNOSIS — Z79899 Other long term (current) drug therapy: Secondary | ICD-10-CM | POA: Diagnosis not present

## 2015-07-09 DIAGNOSIS — R5383 Other fatigue: Secondary | ICD-10-CM | POA: Diagnosis not present

## 2015-07-09 DIAGNOSIS — C4490 Unspecified malignant neoplasm of skin, unspecified: Secondary | ICD-10-CM | POA: Diagnosis not present

## 2015-07-14 DIAGNOSIS — M47812 Spondylosis without myelopathy or radiculopathy, cervical region: Secondary | ICD-10-CM | POA: Diagnosis not present

## 2015-07-14 DIAGNOSIS — M159 Polyosteoarthritis, unspecified: Secondary | ICD-10-CM | POA: Diagnosis not present

## 2015-07-14 DIAGNOSIS — G894 Chronic pain syndrome: Secondary | ICD-10-CM | POA: Diagnosis not present

## 2015-07-14 DIAGNOSIS — G4730 Sleep apnea, unspecified: Secondary | ICD-10-CM | POA: Diagnosis not present

## 2015-07-14 DIAGNOSIS — F419 Anxiety disorder, unspecified: Secondary | ICD-10-CM | POA: Diagnosis not present

## 2015-07-14 DIAGNOSIS — M47816 Spondylosis without myelopathy or radiculopathy, lumbar region: Secondary | ICD-10-CM | POA: Diagnosis not present

## 2015-07-14 DIAGNOSIS — Z720 Tobacco use: Secondary | ICD-10-CM | POA: Diagnosis not present

## 2015-07-18 DIAGNOSIS — J019 Acute sinusitis, unspecified: Secondary | ICD-10-CM | POA: Diagnosis not present

## 2015-07-18 DIAGNOSIS — J45909 Unspecified asthma, uncomplicated: Secondary | ICD-10-CM | POA: Diagnosis not present

## 2015-07-18 DIAGNOSIS — J209 Acute bronchitis, unspecified: Secondary | ICD-10-CM | POA: Diagnosis not present

## 2015-07-26 DIAGNOSIS — F319 Bipolar disorder, unspecified: Secondary | ICD-10-CM | POA: Diagnosis not present

## 2015-07-28 DIAGNOSIS — Z720 Tobacco use: Secondary | ICD-10-CM | POA: Diagnosis not present

## 2015-07-28 DIAGNOSIS — M47812 Spondylosis without myelopathy or radiculopathy, cervical region: Secondary | ICD-10-CM | POA: Diagnosis not present

## 2015-07-28 DIAGNOSIS — M545 Low back pain: Secondary | ICD-10-CM | POA: Diagnosis not present

## 2015-07-28 DIAGNOSIS — G894 Chronic pain syndrome: Secondary | ICD-10-CM | POA: Diagnosis not present

## 2015-07-28 DIAGNOSIS — M47816 Spondylosis without myelopathy or radiculopathy, lumbar region: Secondary | ICD-10-CM | POA: Diagnosis not present

## 2015-07-29 DIAGNOSIS — L578 Other skin changes due to chronic exposure to nonionizing radiation: Secondary | ICD-10-CM | POA: Diagnosis not present

## 2015-07-29 DIAGNOSIS — L820 Inflamed seborrheic keratosis: Secondary | ICD-10-CM | POA: Diagnosis not present

## 2015-07-29 DIAGNOSIS — L219 Seborrheic dermatitis, unspecified: Secondary | ICD-10-CM | POA: Diagnosis not present

## 2015-07-29 DIAGNOSIS — D2371 Other benign neoplasm of skin of right lower limb, including hip: Secondary | ICD-10-CM | POA: Diagnosis not present

## 2015-07-29 DIAGNOSIS — L821 Other seborrheic keratosis: Secondary | ICD-10-CM | POA: Diagnosis not present

## 2015-08-06 DIAGNOSIS — Z79899 Other long term (current) drug therapy: Secondary | ICD-10-CM | POA: Diagnosis not present

## 2015-08-18 DIAGNOSIS — D1723 Benign lipomatous neoplasm of skin and subcutaneous tissue of right leg: Secondary | ICD-10-CM | POA: Diagnosis not present

## 2015-08-18 DIAGNOSIS — C44519 Basal cell carcinoma of skin of other part of trunk: Secondary | ICD-10-CM | POA: Diagnosis not present

## 2015-08-26 DIAGNOSIS — M4806 Spinal stenosis, lumbar region: Secondary | ICD-10-CM | POA: Diagnosis not present

## 2015-08-26 DIAGNOSIS — M5186 Other intervertebral disc disorders, lumbar region: Secondary | ICD-10-CM | POA: Diagnosis not present

## 2015-08-26 DIAGNOSIS — M5126 Other intervertebral disc displacement, lumbar region: Secondary | ICD-10-CM | POA: Diagnosis not present

## 2015-09-01 DIAGNOSIS — F319 Bipolar disorder, unspecified: Secondary | ICD-10-CM | POA: Diagnosis not present

## 2015-09-05 DIAGNOSIS — M25562 Pain in left knee: Secondary | ICD-10-CM | POA: Diagnosis not present

## 2015-09-06 DIAGNOSIS — H2513 Age-related nuclear cataract, bilateral: Secondary | ICD-10-CM | POA: Diagnosis not present

## 2015-10-05 DIAGNOSIS — M1712 Unilateral primary osteoarthritis, left knee: Secondary | ICD-10-CM | POA: Diagnosis not present

## 2015-10-20 DIAGNOSIS — M5136 Other intervertebral disc degeneration, lumbar region: Secondary | ICD-10-CM | POA: Diagnosis not present

## 2015-11-09 DIAGNOSIS — F319 Bipolar disorder, unspecified: Secondary | ICD-10-CM | POA: Diagnosis not present

## 2015-11-12 DIAGNOSIS — M545 Low back pain: Secondary | ICD-10-CM | POA: Diagnosis not present

## 2015-11-12 DIAGNOSIS — G894 Chronic pain syndrome: Secondary | ICD-10-CM | POA: Diagnosis not present

## 2015-11-12 DIAGNOSIS — Z720 Tobacco use: Secondary | ICD-10-CM | POA: Diagnosis not present

## 2015-11-12 DIAGNOSIS — Z1389 Encounter for screening for other disorder: Secondary | ICD-10-CM | POA: Diagnosis not present

## 2015-11-12 DIAGNOSIS — M47816 Spondylosis without myelopathy or radiculopathy, lumbar region: Secondary | ICD-10-CM | POA: Diagnosis not present

## 2015-11-12 DIAGNOSIS — M47812 Spondylosis without myelopathy or radiculopathy, cervical region: Secondary | ICD-10-CM | POA: Diagnosis not present

## 2015-11-23 DIAGNOSIS — G894 Chronic pain syndrome: Secondary | ICD-10-CM | POA: Diagnosis not present

## 2015-11-23 DIAGNOSIS — G239 Degenerative disease of basal ganglia, unspecified: Secondary | ICD-10-CM | POA: Diagnosis not present

## 2015-11-23 DIAGNOSIS — M1380 Other specified arthritis, unspecified site: Secondary | ICD-10-CM | POA: Diagnosis not present

## 2015-11-23 DIAGNOSIS — F419 Anxiety disorder, unspecified: Secondary | ICD-10-CM | POA: Diagnosis not present

## 2015-11-23 DIAGNOSIS — E669 Obesity, unspecified: Secondary | ICD-10-CM | POA: Diagnosis not present

## 2015-11-25 DIAGNOSIS — N393 Stress incontinence (female) (male): Secondary | ICD-10-CM | POA: Diagnosis not present

## 2015-11-25 DIAGNOSIS — N318 Other neuromuscular dysfunction of bladder: Secondary | ICD-10-CM | POA: Diagnosis not present

## 2015-11-25 DIAGNOSIS — N3000 Acute cystitis without hematuria: Secondary | ICD-10-CM | POA: Diagnosis not present

## 2015-11-25 DIAGNOSIS — N3941 Urge incontinence: Secondary | ICD-10-CM | POA: Diagnosis not present

## 2015-12-14 DIAGNOSIS — F319 Bipolar disorder, unspecified: Secondary | ICD-10-CM | POA: Diagnosis not present

## 2015-12-15 DIAGNOSIS — Z720 Tobacco use: Secondary | ICD-10-CM | POA: Diagnosis not present

## 2015-12-15 DIAGNOSIS — M47812 Spondylosis without myelopathy or radiculopathy, cervical region: Secondary | ICD-10-CM | POA: Diagnosis not present

## 2015-12-15 DIAGNOSIS — M545 Low back pain: Secondary | ICD-10-CM | POA: Diagnosis not present

## 2015-12-15 DIAGNOSIS — G894 Chronic pain syndrome: Secondary | ICD-10-CM | POA: Diagnosis not present

## 2015-12-15 DIAGNOSIS — M47816 Spondylosis without myelopathy or radiculopathy, lumbar region: Secondary | ICD-10-CM | POA: Diagnosis not present

## 2015-12-15 DIAGNOSIS — Z1389 Encounter for screening for other disorder: Secondary | ICD-10-CM | POA: Diagnosis not present

## 2015-12-16 DIAGNOSIS — F319 Bipolar disorder, unspecified: Secondary | ICD-10-CM | POA: Diagnosis not present

## 2015-12-16 DIAGNOSIS — Z9889 Other specified postprocedural states: Secondary | ICD-10-CM | POA: Diagnosis not present

## 2015-12-16 DIAGNOSIS — M79609 Pain in unspecified limb: Secondary | ICD-10-CM | POA: Diagnosis not present

## 2015-12-16 DIAGNOSIS — Z01818 Encounter for other preprocedural examination: Secondary | ICD-10-CM | POA: Diagnosis not present

## 2015-12-16 DIAGNOSIS — R52 Pain, unspecified: Secondary | ICD-10-CM | POA: Diagnosis not present

## 2015-12-16 DIAGNOSIS — Z79899 Other long term (current) drug therapy: Secondary | ICD-10-CM | POA: Diagnosis not present

## 2015-12-16 DIAGNOSIS — Z01810 Encounter for preprocedural cardiovascular examination: Secondary | ICD-10-CM | POA: Diagnosis not present

## 2015-12-22 DIAGNOSIS — Z880 Allergy status to penicillin: Secondary | ICD-10-CM | POA: Diagnosis not present

## 2015-12-22 DIAGNOSIS — I959 Hypotension, unspecified: Secondary | ICD-10-CM | POA: Diagnosis not present

## 2015-12-22 DIAGNOSIS — J449 Chronic obstructive pulmonary disease, unspecified: Secondary | ICD-10-CM | POA: Diagnosis present

## 2015-12-22 DIAGNOSIS — G92 Toxic encephalopathy: Secondary | ICD-10-CM | POA: Diagnosis present

## 2015-12-22 DIAGNOSIS — F17210 Nicotine dependence, cigarettes, uncomplicated: Secondary | ICD-10-CM | POA: Diagnosis present

## 2015-12-22 DIAGNOSIS — R4182 Altered mental status, unspecified: Secondary | ICD-10-CM | POA: Diagnosis not present

## 2015-12-22 DIAGNOSIS — Z9884 Bariatric surgery status: Secondary | ICD-10-CM | POA: Diagnosis not present

## 2015-12-22 DIAGNOSIS — F250 Schizoaffective disorder, bipolar type: Secondary | ICD-10-CM | POA: Diagnosis present

## 2015-12-22 DIAGNOSIS — I10 Essential (primary) hypertension: Secondary | ICD-10-CM | POA: Diagnosis present

## 2015-12-22 DIAGNOSIS — R4781 Slurred speech: Secondary | ICD-10-CM | POA: Diagnosis not present

## 2015-12-22 DIAGNOSIS — R0902 Hypoxemia: Secondary | ICD-10-CM | POA: Diagnosis not present

## 2015-12-22 DIAGNOSIS — Z79899 Other long term (current) drug therapy: Secondary | ICD-10-CM | POA: Diagnosis not present

## 2015-12-22 DIAGNOSIS — Z888 Allergy status to other drugs, medicaments and biological substances status: Secondary | ICD-10-CM | POA: Diagnosis not present

## 2015-12-22 DIAGNOSIS — N179 Acute kidney failure, unspecified: Secondary | ICD-10-CM | POA: Diagnosis not present

## 2015-12-22 DIAGNOSIS — T40601A Poisoning by unspecified narcotics, accidental (unintentional), initial encounter: Secondary | ICD-10-CM | POA: Diagnosis present

## 2015-12-22 DIAGNOSIS — Z6841 Body Mass Index (BMI) 40.0 and over, adult: Secondary | ICD-10-CM | POA: Diagnosis not present

## 2015-12-22 DIAGNOSIS — M1990 Unspecified osteoarthritis, unspecified site: Secondary | ICD-10-CM | POA: Diagnosis present

## 2015-12-22 DIAGNOSIS — R404 Transient alteration of awareness: Secondary | ICD-10-CM | POA: Diagnosis not present

## 2015-12-25 DIAGNOSIS — H66009 Acute suppurative otitis media without spontaneous rupture of ear drum, unspecified ear: Secondary | ICD-10-CM | POA: Diagnosis not present

## 2016-01-02 DIAGNOSIS — J0100 Acute maxillary sinusitis, unspecified: Secondary | ICD-10-CM | POA: Diagnosis not present

## 2016-01-07 DIAGNOSIS — H6982 Other specified disorders of Eustachian tube, left ear: Secondary | ICD-10-CM | POA: Diagnosis not present

## 2016-01-07 DIAGNOSIS — H9192 Unspecified hearing loss, left ear: Secondary | ICD-10-CM | POA: Diagnosis not present

## 2016-01-12 DIAGNOSIS — M47812 Spondylosis without myelopathy or radiculopathy, cervical region: Secondary | ICD-10-CM | POA: Diagnosis not present

## 2016-01-12 DIAGNOSIS — Z720 Tobacco use: Secondary | ICD-10-CM | POA: Diagnosis not present

## 2016-01-12 DIAGNOSIS — M47816 Spondylosis without myelopathy or radiculopathy, lumbar region: Secondary | ICD-10-CM | POA: Diagnosis not present

## 2016-01-12 DIAGNOSIS — M545 Low back pain: Secondary | ICD-10-CM | POA: Diagnosis not present

## 2016-01-12 DIAGNOSIS — G894 Chronic pain syndrome: Secondary | ICD-10-CM | POA: Diagnosis not present

## 2016-01-17 DIAGNOSIS — H9192 Unspecified hearing loss, left ear: Secondary | ICD-10-CM | POA: Diagnosis not present

## 2016-01-17 DIAGNOSIS — H903 Sensorineural hearing loss, bilateral: Secondary | ICD-10-CM | POA: Diagnosis not present

## 2016-01-17 DIAGNOSIS — H938X2 Other specified disorders of left ear: Secondary | ICD-10-CM | POA: Diagnosis not present

## 2016-02-05 DIAGNOSIS — M79605 Pain in left leg: Secondary | ICD-10-CM | POA: Diagnosis not present

## 2016-02-05 DIAGNOSIS — M25562 Pain in left knee: Secondary | ICD-10-CM | POA: Diagnosis not present

## 2016-02-05 DIAGNOSIS — S8392XA Sprain of unspecified site of left knee, initial encounter: Secondary | ICD-10-CM | POA: Diagnosis not present

## 2016-02-05 DIAGNOSIS — Z23 Encounter for immunization: Secondary | ICD-10-CM | POA: Diagnosis not present

## 2016-02-16 DIAGNOSIS — Z720 Tobacco use: Secondary | ICD-10-CM | POA: Diagnosis not present

## 2016-02-16 DIAGNOSIS — G894 Chronic pain syndrome: Secondary | ICD-10-CM | POA: Diagnosis not present

## 2016-02-16 DIAGNOSIS — M47816 Spondylosis without myelopathy or radiculopathy, lumbar region: Secondary | ICD-10-CM | POA: Diagnosis not present

## 2016-02-16 DIAGNOSIS — M545 Low back pain: Secondary | ICD-10-CM | POA: Diagnosis not present

## 2016-02-16 DIAGNOSIS — M47812 Spondylosis without myelopathy or radiculopathy, cervical region: Secondary | ICD-10-CM | POA: Diagnosis not present

## 2016-02-22 DIAGNOSIS — M1712 Unilateral primary osteoarthritis, left knee: Secondary | ICD-10-CM | POA: Diagnosis not present

## 2016-02-22 DIAGNOSIS — M25562 Pain in left knee: Secondary | ICD-10-CM | POA: Diagnosis not present

## 2016-02-28 DIAGNOSIS — G894 Chronic pain syndrome: Secondary | ICD-10-CM | POA: Diagnosis not present

## 2016-02-28 DIAGNOSIS — I10 Essential (primary) hypertension: Secondary | ICD-10-CM | POA: Diagnosis not present

## 2016-02-28 DIAGNOSIS — F419 Anxiety disorder, unspecified: Secondary | ICD-10-CM | POA: Diagnosis not present

## 2016-02-28 DIAGNOSIS — M1380 Other specified arthritis, unspecified site: Secondary | ICD-10-CM | POA: Diagnosis not present

## 2016-03-09 DIAGNOSIS — I10 Essential (primary) hypertension: Secondary | ICD-10-CM | POA: Diagnosis not present

## 2016-03-09 DIAGNOSIS — R701 Abnormal plasma viscosity: Secondary | ICD-10-CM | POA: Diagnosis not present

## 2016-03-09 DIAGNOSIS — E162 Hypoglycemia, unspecified: Secondary | ICD-10-CM | POA: Diagnosis not present

## 2016-03-09 DIAGNOSIS — R5382 Chronic fatigue, unspecified: Secondary | ICD-10-CM | POA: Diagnosis not present

## 2016-03-20 DIAGNOSIS — F319 Bipolar disorder, unspecified: Secondary | ICD-10-CM | POA: Diagnosis not present

## 2016-03-24 DIAGNOSIS — J0100 Acute maxillary sinusitis, unspecified: Secondary | ICD-10-CM | POA: Diagnosis not present

## 2016-03-24 DIAGNOSIS — J209 Acute bronchitis, unspecified: Secondary | ICD-10-CM | POA: Diagnosis not present

## 2016-04-04 DIAGNOSIS — M25562 Pain in left knee: Secondary | ICD-10-CM | POA: Diagnosis not present

## 2016-04-04 DIAGNOSIS — M1712 Unilateral primary osteoarthritis, left knee: Secondary | ICD-10-CM | POA: Diagnosis not present

## 2016-04-04 DIAGNOSIS — G8929 Other chronic pain: Secondary | ICD-10-CM | POA: Diagnosis not present

## 2016-04-23 DIAGNOSIS — S8012XA Contusion of left lower leg, initial encounter: Secondary | ICD-10-CM | POA: Diagnosis not present

## 2016-04-23 DIAGNOSIS — M25561 Pain in right knee: Secondary | ICD-10-CM | POA: Diagnosis not present

## 2016-04-23 DIAGNOSIS — M25572 Pain in left ankle and joints of left foot: Secondary | ICD-10-CM | POA: Diagnosis not present

## 2016-04-23 DIAGNOSIS — S99912A Unspecified injury of left ankle, initial encounter: Secondary | ICD-10-CM | POA: Diagnosis not present

## 2016-04-23 DIAGNOSIS — S99911A Unspecified injury of right ankle, initial encounter: Secondary | ICD-10-CM | POA: Diagnosis not present

## 2016-04-23 DIAGNOSIS — S8991XA Unspecified injury of right lower leg, initial encounter: Secondary | ICD-10-CM | POA: Diagnosis not present

## 2016-04-23 DIAGNOSIS — S8001XA Contusion of right knee, initial encounter: Secondary | ICD-10-CM | POA: Diagnosis not present

## 2016-04-23 DIAGNOSIS — M25571 Pain in right ankle and joints of right foot: Secondary | ICD-10-CM | POA: Diagnosis not present

## 2016-04-23 DIAGNOSIS — S8010XA Contusion of unspecified lower leg, initial encounter: Secondary | ICD-10-CM | POA: Diagnosis not present

## 2016-04-25 DIAGNOSIS — M1712 Unilateral primary osteoarthritis, left knee: Secondary | ICD-10-CM | POA: Diagnosis not present

## 2016-05-04 DIAGNOSIS — F419 Anxiety disorder, unspecified: Secondary | ICD-10-CM | POA: Diagnosis not present

## 2016-05-04 DIAGNOSIS — I10 Essential (primary) hypertension: Secondary | ICD-10-CM | POA: Diagnosis not present

## 2016-05-04 DIAGNOSIS — G894 Chronic pain syndrome: Secondary | ICD-10-CM | POA: Diagnosis not present

## 2016-05-04 DIAGNOSIS — F339 Major depressive disorder, recurrent, unspecified: Secondary | ICD-10-CM | POA: Diagnosis not present

## 2016-05-04 DIAGNOSIS — F319 Bipolar disorder, unspecified: Secondary | ICD-10-CM | POA: Diagnosis not present

## 2016-05-29 DIAGNOSIS — F319 Bipolar disorder, unspecified: Secondary | ICD-10-CM | POA: Diagnosis not present

## 2016-06-05 DIAGNOSIS — J45909 Unspecified asthma, uncomplicated: Secondary | ICD-10-CM | POA: Diagnosis not present

## 2016-06-05 DIAGNOSIS — M5136 Other intervertebral disc degeneration, lumbar region: Secondary | ICD-10-CM | POA: Diagnosis not present

## 2016-06-05 DIAGNOSIS — M1991 Primary osteoarthritis, unspecified site: Secondary | ICD-10-CM | POA: Diagnosis not present

## 2016-06-05 DIAGNOSIS — F17200 Nicotine dependence, unspecified, uncomplicated: Secondary | ICD-10-CM | POA: Diagnosis not present

## 2016-06-05 DIAGNOSIS — G894 Chronic pain syndrome: Secondary | ICD-10-CM | POA: Diagnosis not present

## 2016-06-06 DIAGNOSIS — M1712 Unilateral primary osteoarthritis, left knee: Secondary | ICD-10-CM | POA: Diagnosis not present

## 2016-06-11 ENCOUNTER — Ambulatory Visit: Payer: Self-pay | Admitting: Orthopedic Surgery

## 2016-06-13 DIAGNOSIS — F319 Bipolar disorder, unspecified: Secondary | ICD-10-CM | POA: Diagnosis not present

## 2016-06-13 DIAGNOSIS — Z79899 Other long term (current) drug therapy: Secondary | ICD-10-CM | POA: Diagnosis not present

## 2016-06-16 DIAGNOSIS — M1712 Unilateral primary osteoarthritis, left knee: Secondary | ICD-10-CM | POA: Diagnosis not present

## 2016-06-29 DIAGNOSIS — F419 Anxiety disorder, unspecified: Secondary | ICD-10-CM | POA: Diagnosis not present

## 2016-06-29 DIAGNOSIS — I10 Essential (primary) hypertension: Secondary | ICD-10-CM | POA: Diagnosis not present

## 2016-06-29 DIAGNOSIS — G894 Chronic pain syndrome: Secondary | ICD-10-CM | POA: Diagnosis not present

## 2016-06-29 DIAGNOSIS — M1991 Primary osteoarthritis, unspecified site: Secondary | ICD-10-CM | POA: Diagnosis not present

## 2016-06-30 ENCOUNTER — Inpatient Hospital Stay (HOSPITAL_COMMUNITY): Admission: RE | Admit: 2016-06-30 | Payer: Medicare Other | Source: Ambulatory Visit

## 2016-07-03 ENCOUNTER — Encounter (HOSPITAL_COMMUNITY): Admission: RE | Payer: Self-pay | Source: Ambulatory Visit

## 2016-07-03 ENCOUNTER — Inpatient Hospital Stay (HOSPITAL_COMMUNITY): Admission: RE | Admit: 2016-07-03 | Payer: Medicare Other | Source: Ambulatory Visit | Admitting: Orthopedic Surgery

## 2016-07-03 SURGERY — ARTHROPLASTY, KNEE, TOTAL, USING IMAGELESS COMPUTER-ASSISTED NAVIGATION
Anesthesia: Spinal | Laterality: Left

## 2016-08-03 DIAGNOSIS — J069 Acute upper respiratory infection, unspecified: Secondary | ICD-10-CM | POA: Diagnosis not present

## 2016-08-03 DIAGNOSIS — E669 Obesity, unspecified: Secondary | ICD-10-CM | POA: Diagnosis not present

## 2016-08-03 DIAGNOSIS — F419 Anxiety disorder, unspecified: Secondary | ICD-10-CM | POA: Diagnosis not present

## 2016-08-03 DIAGNOSIS — I10 Essential (primary) hypertension: Secondary | ICD-10-CM | POA: Diagnosis not present

## 2016-08-03 DIAGNOSIS — M1991 Primary osteoarthritis, unspecified site: Secondary | ICD-10-CM | POA: Diagnosis not present

## 2016-08-04 DIAGNOSIS — H66009 Acute suppurative otitis media without spontaneous rupture of ear drum, unspecified ear: Secondary | ICD-10-CM | POA: Diagnosis not present

## 2016-08-11 DIAGNOSIS — H6691 Otitis media, unspecified, right ear: Secondary | ICD-10-CM | POA: Diagnosis not present

## 2016-08-11 DIAGNOSIS — H6692 Otitis media, unspecified, left ear: Secondary | ICD-10-CM | POA: Diagnosis not present

## 2016-08-11 DIAGNOSIS — J209 Acute bronchitis, unspecified: Secondary | ICD-10-CM | POA: Diagnosis not present

## 2016-08-17 DIAGNOSIS — F319 Bipolar disorder, unspecified: Secondary | ICD-10-CM | POA: Diagnosis not present

## 2016-08-31 DIAGNOSIS — G894 Chronic pain syndrome: Secondary | ICD-10-CM | POA: Diagnosis not present

## 2016-08-31 DIAGNOSIS — I10 Essential (primary) hypertension: Secondary | ICD-10-CM | POA: Diagnosis not present

## 2016-08-31 DIAGNOSIS — M1991 Primary osteoarthritis, unspecified site: Secondary | ICD-10-CM | POA: Diagnosis not present

## 2016-09-07 DIAGNOSIS — H11153 Pinguecula, bilateral: Secondary | ICD-10-CM | POA: Diagnosis not present

## 2016-09-07 DIAGNOSIS — H25813 Combined forms of age-related cataract, bilateral: Secondary | ICD-10-CM | POA: Diagnosis not present

## 2016-09-07 DIAGNOSIS — H3589 Other specified retinal disorders: Secondary | ICD-10-CM | POA: Diagnosis not present

## 2016-09-11 ENCOUNTER — Ambulatory Visit: Payer: Self-pay | Admitting: Orthopedic Surgery

## 2016-09-26 ENCOUNTER — Ambulatory Visit: Payer: Self-pay | Admitting: Orthopedic Surgery

## 2016-09-26 NOTE — H&P (Signed)
TOTAL KNEE ADMISSION H&P  Patient is being admitted for left total knee arthroplasty.  Subjective:  Chief Complaint:left knee pain.  HPI: Diana Oneill, 61 y.o. female, has a history of pain and functional disability in the left knee due to arthritis and has failed non-surgical conservative treatments for greater than 12 weeks to includeNSAID's and/or analgesics, corticosteriod injections, flexibility and strengthening excercises, use of assistive devices, weight reduction as appropriate and activity modification.  Onset of symptoms was gradual, starting 5 years ago with rapidlly worsening course since that time. The patient noted no past surgery on the left knee(s).  Patient currently rates pain in the left knee(s) at 10 out of 10 with activity. Patient has night pain, worsening of pain with activity and weight bearing, pain that interferes with activities of daily living, pain with passive range of motion and joint swelling.  Patient has evidence of subchondral cysts, subchondral sclerosis, periarticular osteophytes and joint space narrowing by imaging studies. There is no active infection.  There are no active problems to display for this patient.  No past medical history on file.  No past surgical history on file.   (Not in a hospital admission) Allergies  Allergen Reactions  . Penicillins Anaphylaxis    Has patient had a PCN reaction causing immediate rash, facial/tongue/throat swelling, SOB or lightheadedness with hypotension: Yes Has patient had a PCN reaction causing severe rash involving mucus membranes or skin necrosis: No Has patient had a PCN reaction that required hospitalization No Has patient had a PCN reaction occurring within the last 10 years: No If all of the above answers are "NO", then may proceed with Cephalosporin use.     Social History  Substance Use Topics  . Smoking status: Not on file  . Smokeless tobacco: Not on file  . Alcohol use Not on file     No family history on file.   Review of Systems  Constitutional: Negative.   HENT: Negative.   Eyes: Negative.   Respiratory: Positive for shortness of breath.   Cardiovascular: Negative.   Gastrointestinal: Negative.   Genitourinary: Negative.   Musculoskeletal: Positive for back pain and joint pain.  Skin: Negative.   Neurological: Negative.   Endo/Heme/Allergies: Positive for environmental allergies.  Psychiatric/Behavioral: Negative.     Objective:  Physical Exam  Vital signs in last 24 hours: @VSRANGES @  Labs:   There is no height or weight on file to calculate BMI.   Imaging Review Plain radiographs demonstrate severe degenerative joint disease of the left knee(s). The overall alignment issignificant valgus. The bone quality appears to be adequate for age and reported activity level.  Assessment/Plan:  End stage arthritis, left knee   The patient history, physical examination, clinical judgment of the provider and imaging studies are consistent with end stage degenerative joint disease of the left knee(s) and total knee arthroplasty is deemed medically necessary. The treatment options including medical management, injection therapy arthroscopy and arthroplasty were discussed at length. The risks and benefits of total knee arthroplasty were presented and reviewed. The risks due to aseptic loosening, infection, stiffness, patella tracking problems, thromboembolic complications and other imponderables were discussed. The patient acknowledged the explanation, agreed to proceed with the plan and consent was signed. Patient is being admitted for inpatient treatment for surgery, pain control, PT, OT, prophylactic antibiotics, VTE prophylaxis, progressive ambulation and ADL's and discharge planning. The patient is planning to be discharged home with outpatient PT (ProPT). Already has DME.

## 2016-09-29 DIAGNOSIS — I712 Thoracic aortic aneurysm, without rupture, unspecified: Secondary | ICD-10-CM

## 2016-09-29 DIAGNOSIS — D696 Thrombocytopenia, unspecified: Secondary | ICD-10-CM

## 2016-09-29 HISTORY — DX: Thoracic aortic aneurysm, without rupture, unspecified: I71.20

## 2016-09-29 HISTORY — DX: Thoracic aortic aneurysm, without rupture: I71.2

## 2016-09-29 HISTORY — DX: Thrombocytopenia, unspecified: D69.6

## 2016-10-13 ENCOUNTER — Encounter (HOSPITAL_COMMUNITY): Payer: Self-pay

## 2016-10-13 ENCOUNTER — Encounter (HOSPITAL_COMMUNITY)
Admission: RE | Admit: 2016-10-13 | Discharge: 2016-10-13 | Disposition: A | Discharge status: Home / Self Care | Payer: Medicare Other | Source: Ambulatory Visit | Attending: Orthopedic Surgery | Admitting: Orthopedic Surgery

## 2016-10-13 DIAGNOSIS — Z01810 Encounter for preprocedural cardiovascular examination: Secondary | ICD-10-CM | POA: Diagnosis not present

## 2016-10-13 DIAGNOSIS — M1712 Unilateral primary osteoarthritis, left knee: Secondary | ICD-10-CM | POA: Insufficient documentation

## 2016-10-13 DIAGNOSIS — Z01818 Encounter for other preprocedural examination: Secondary | ICD-10-CM | POA: Insufficient documentation

## 2016-10-13 HISTORY — DX: Major depressive disorder, single episode, unspecified: F32.9

## 2016-10-13 HISTORY — DX: Depression, unspecified: F32.A

## 2016-10-13 HISTORY — DX: Malignant (primary) neoplasm, unspecified: C80.1

## 2016-10-13 HISTORY — DX: Sleep apnea, unspecified: G47.30

## 2016-10-13 HISTORY — DX: Essential (primary) hypertension: I10

## 2016-10-13 HISTORY — DX: Dyspnea, unspecified: R06.00

## 2016-10-13 HISTORY — DX: Personal history of urinary calculi: Z87.442

## 2016-10-13 HISTORY — DX: Anxiety disorder, unspecified: F41.9

## 2016-10-13 HISTORY — DX: Unspecified osteoarthritis, unspecified site: M19.90

## 2016-10-13 HISTORY — DX: Chronic obstructive pulmonary disease, unspecified: J44.9

## 2016-10-13 LAB — BASIC METABOLIC PANEL
ANION GAP: 8 (ref 5–15)
BUN: 20 mg/dL (ref 6–20)
CALCIUM: 8.6 mg/dL — AB (ref 8.9–10.3)
CO2: 24 mmol/L (ref 22–32)
CREATININE: 1.18 mg/dL — AB (ref 0.44–1.00)
Chloride: 99 mmol/L — ABNORMAL LOW (ref 101–111)
GFR calc Af Amer: 57 mL/min — ABNORMAL LOW (ref >60)
GFR, EST NON AFRICAN AMERICAN: 49 mL/min — AB (ref >60)
GLUCOSE: 76 mg/dL (ref 65–99)
Potassium: 5.1 mmol/L (ref 3.5–5.1)
Sodium: 131 mmol/L — ABNORMAL LOW (ref 135–145)

## 2016-10-13 LAB — TYPE AND SCREEN
ABO/RH(D): B POS
Antibody Screen: NEGATIVE

## 2016-10-13 LAB — CBC
HCT: 38.2 % (ref 36.0–46.0)
HEMOGLOBIN: 12.4 g/dL (ref 12.0–15.0)
MCH: 27.7 pg (ref 26.0–34.0)
MCHC: 32.5 g/dL (ref 30.0–36.0)
MCV: 85.5 fL (ref 78.0–100.0)
PLATELETS: 139 10*3/uL — AB (ref 150–400)
RBC: 4.47 MIL/uL (ref 3.87–5.11)
RDW: 15 % (ref 11.5–15.5)
WBC: 6 10*3/uL (ref 4.0–10.5)

## 2016-10-13 LAB — SURGICAL PCR SCREEN
MRSA, PCR: NEGATIVE
STAPHYLOCOCCUS AUREUS: NEGATIVE

## 2016-10-13 LAB — ABO/RH: ABO/RH(D): B POS

## 2016-10-13 NOTE — Pre-Procedure Instructions (Addendum)
Diana Oneill [facility]  10/13/2016      [place], [place] [address] Phone: [phone] Fax: [phone]    Your procedure is scheduled on 10-23-2016 Monday .  Report to [facility] Admitting at 5:30 A.M.   Call this number if you have problems the morning of surgery:  [phone]   Remember:  Do not eat food or drink liquids after midnight.   Take these medicines the morning of surgery with A SIP OF WATER Albuterol nebulizer if needed,clonazepam(Klonopin),Depakote,Percocet if needed,    STOP ASPIRIN,ANTIINFLAMATORIES (IBUPROFEN,ALEVE,MOTRIN,ADVIL,GOODY'S POWDERS),HERBAL SUPPLEMENTS,FISH OIL,AND VITAMINS 5-7 DAYS PRIOR TO SURGERY     Do not wear jewelry, make-up or nail polish.  Do not wear lotions, powders, or perfumes, or deoderant.  Do not shave 48 hours prior to surgery.  Men may shave face and neck.  Do not bring valuables to the hospital.  [facility] is not responsible for any belongings or valuables.  Contacts, dentures or bridgework may not be worn into surgery.  Leave your suitcase in the car.  After surgery it may be brought to your room.  For patients admitted to the hospital, discharge time will be determined by your treatment team.  Patients discharged the day of surgery will not be allowed to drive home.    Special Instructions: Diana Oneill - Preparing for Surgery  Before surgery, you can play an important role.  Because skin is not sterile, your skin needs to be as free of germs as possible.  You can reduce the number of germs on you skin by washing with CHG (chlorahexidine gluconate) soap before surgery.  CHG is an antiseptic cleaner which kills germs and bonds with the skin to continue killing germs even after washing.  Please DO NOT use if you have an allergy to CHG or antibacterial soaps.  If your skin becomes reddened/irritated stop using the CHG and inform your nurse when you  arrive at Short Stay.  Do not shave (including legs and underarms) for at least 48 hours prior to the first CHG shower.  You may shave your face.  Please follow these instructions carefully:   1.  Shower with CHG Soap the night before surgery and the   morning of Surgery.  2.  If you choose to wash your hair, wash your hair first as usual with your normal shampoo.  3.  After you shampoo, rinse your hair and body thoroughly to remove the  Shampoo.  4.  Use CHG as you would any other liquid soap.  You can apply chg directly  to the skin and wash gently with scrungie or a clean washcloth.  5.  Apply the CHG Soap to your body ONLY FROM THE NECK DOWN.   Do not use on open wounds or open sores.  Avoid contact with your eyes,  ears, mouth and genitals (private parts).  Wash genitals (private parts) with your normal soap.  6.  Wash thoroughly, paying special attention to the area where your surgery will be performed.  7.  Thoroughly rinse your body with warm water from the neck down.  8.  DO NOT shower/wash with your normal soap after using and rinsing o  the CHG Soap.  9.  Pat yourself dry with a clean towel.            10.  Wear clean pajamas.            11.  Place clean sheets on your bed the night of your first shower and do not sleep with pets.  Day of Surgery  Do not apply any lotions/deodorants the morning of surgery.  Please wear clean clothes to the hospital/surgery center.   Please read over the following fact sheets that you were given. MRSA Information and Surgical Site Infection Prevention,Incentive Spirometry

## 2016-10-13 NOTE — Progress Notes (Signed)
PCP  Dr. York Ram   [facility] in [place]   2012  Has cardiac test done at [facility] told all test were normal,has never seen a cardiologist since and no further problems,was told it was stress

## 2016-10-13 NOTE — Progress Notes (Signed)
Left message for Diana Oneill in Dr. Sid Falcon office that we do not have TED hose that will fit. Diana Oneill due to the size of he legs.

## 2016-10-20 MED ORDER — SODIUM CHLORIDE 0.9 % IV SOLN: INTRAVENOUS | Status: DC

## 2016-10-20 MED ORDER — SODIUM CHLORIDE 0.9 % IV SOLN
1000.0000 mg | INTRAVENOUS | Status: AC
  Administered 2016-10-23: 1000 mg via INTRAVENOUS
  Filled 2016-10-20: qty 10

## 2016-10-20 MED ORDER — ACETAMINOPHEN 10 MG/ML IV SOLN
1000.0000 mg | INTRAVENOUS | Status: AC
  Administered 2016-10-23: 1000 mg via INTRAVENOUS
  Filled 2016-10-20: qty 100

## 2016-10-20 MED ORDER — VANCOMYCIN HCL 10 G IV SOLR
1500.0000 mg | INTRAVENOUS | Status: AC
  Administered 2016-10-23: 1500 mg via INTRAVENOUS
  Filled 2016-10-20: qty 1500

## 2016-10-22 NOTE — Anesthesia Preprocedure Evaluation (Addendum)
Anesthesia Evaluation  Patient identified by MRN, date of birth, ID band Patient awake    Reviewed: Allergy & Precautions, NPO status , Patient's Chart, lab work & pertinent test results  Airway Mallampati: IV  TM Distance: >3 FB Neck ROM: Full    Dental no notable dental hx. (+) Edentulous Upper, Dental Advisory Given, Missing   Pulmonary shortness of breath and with exertion, COPD (mild, controlled),  COPD inhaler, former smoker,    Pulmonary exam normal breath sounds clear to auscultation       Cardiovascular hypertension, Pt. on medications Normal cardiovascular exam Rhythm:Regular Rate:Normal  ECG: NSR, rate 60   Neuro/Psych Anxiety Depression negative neurological ROS     GI/Hepatic negative GI ROS, Neg liver ROS,   Endo/Other  Morbid obesity  Renal/GU negative Renal ROS  negative genitourinary   Musculoskeletal  (+) Arthritis , Osteoarthritis,    Abdominal (+) + obese,   Peds negative pediatric ROS (+)  Hematology negative hematology ROS (+)   Anesthesia Other Findings BMI 41  Reproductive/Obstetrics negative OB ROS                           Anesthesia Physical Anesthesia Plan  ASA: III  Anesthesia Plan: Spinal and Regional   Post-op Pain Management:  Regional for Post-op pain   Induction: Intravenous  PONV Risk Score and Plan: 2 and Ondansetron, Dexamethasone, Propofol and Midazolam  Airway Management Planned:   Additional Equipment:   Intra-op Plan:   Post-operative Plan:   Informed Consent: I have reviewed the patients History and Physical, chart, labs and discussed the procedure including the risks, benefits and alternatives for the proposed anesthesia with the patient or authorized representative who has indicated his/her understanding and acceptance.   Dental advisory given  Plan Discussed with: CRNA  Anesthesia Plan Comments:         Anesthesia Quick  Evaluation

## 2016-10-23 ENCOUNTER — Inpatient Hospital Stay (HOSPITAL_COMMUNITY): Payer: Medicare Other | Admitting: Anesthesiology

## 2016-10-23 ENCOUNTER — Encounter (HOSPITAL_COMMUNITY): Payer: Self-pay | Admitting: Anesthesiology

## 2016-10-23 ENCOUNTER — Inpatient Hospital Stay (HOSPITAL_COMMUNITY): Payer: Medicare Other | Admitting: Emergency Medicine

## 2016-10-23 ENCOUNTER — Encounter (HOSPITAL_COMMUNITY)
Admission: RE | Disposition: A | Discharge status: Home / Self Care | Payer: Self-pay | Source: Ambulatory Visit | Attending: Orthopedic Surgery

## 2016-10-23 ENCOUNTER — Inpatient Hospital Stay (HOSPITAL_COMMUNITY): Payer: Medicare Other

## 2016-10-23 ENCOUNTER — Inpatient Hospital Stay (HOSPITAL_COMMUNITY)
Admission: RE | Admit: 2016-10-23 | Discharge: 2016-10-31 | DRG: 469 | Disposition: A | Discharge status: Home / Self Care | Payer: Medicare Other | Source: Ambulatory Visit | Attending: Orthopedic Surgery | Admitting: Orthopedic Surgery

## 2016-10-23 DIAGNOSIS — Z09 Encounter for follow-up examination after completed treatment for conditions other than malignant neoplasm: Secondary | ICD-10-CM

## 2016-10-23 DIAGNOSIS — F419 Anxiety disorder, unspecified: Secondary | ICD-10-CM | POA: Diagnosis present

## 2016-10-23 DIAGNOSIS — D649 Anemia, unspecified: Secondary | ICD-10-CM | POA: Diagnosis present

## 2016-10-23 DIAGNOSIS — Z85828 Personal history of other malignant neoplasm of skin: Secondary | ICD-10-CM

## 2016-10-23 DIAGNOSIS — G92 Toxic encephalopathy: Secondary | ICD-10-CM | POA: Diagnosis not present

## 2016-10-23 DIAGNOSIS — E669 Obesity, unspecified: Secondary | ICD-10-CM | POA: Diagnosis present

## 2016-10-23 DIAGNOSIS — D62 Acute posthemorrhagic anemia: Secondary | ICD-10-CM | POA: Diagnosis not present

## 2016-10-23 DIAGNOSIS — Z79899 Other long term (current) drug therapy: Secondary | ICD-10-CM | POA: Diagnosis not present

## 2016-10-23 DIAGNOSIS — I10 Essential (primary) hypertension: Secondary | ICD-10-CM | POA: Diagnosis present

## 2016-10-23 DIAGNOSIS — G4733 Obstructive sleep apnea (adult) (pediatric): Secondary | ICD-10-CM | POA: Diagnosis not present

## 2016-10-23 DIAGNOSIS — R000 Tachycardia, unspecified: Secondary | ICD-10-CM | POA: Diagnosis not present

## 2016-10-23 DIAGNOSIS — R059 Cough, unspecified: Secondary | ICD-10-CM

## 2016-10-23 DIAGNOSIS — E870 Hyperosmolality and hypernatremia: Secondary | ICD-10-CM | POA: Diagnosis not present

## 2016-10-23 DIAGNOSIS — E872 Acidosis, unspecified: Secondary | ICD-10-CM | POA: Diagnosis not present

## 2016-10-23 DIAGNOSIS — Z79891 Long term (current) use of opiate analgesic: Secondary | ICD-10-CM | POA: Diagnosis not present

## 2016-10-23 DIAGNOSIS — G8918 Other acute postprocedural pain: Secondary | ICD-10-CM | POA: Diagnosis not present

## 2016-10-23 DIAGNOSIS — R05 Cough: Secondary | ICD-10-CM | POA: Diagnosis not present

## 2016-10-23 DIAGNOSIS — G9340 Encephalopathy, unspecified: Secondary | ICD-10-CM | POA: Diagnosis not present

## 2016-10-23 DIAGNOSIS — J431 Panlobular emphysema: Secondary | ICD-10-CM | POA: Diagnosis not present

## 2016-10-23 DIAGNOSIS — E860 Dehydration: Secondary | ICD-10-CM | POA: Diagnosis not present

## 2016-10-23 DIAGNOSIS — J449 Chronic obstructive pulmonary disease, unspecified: Secondary | ICD-10-CM | POA: Diagnosis present

## 2016-10-23 DIAGNOSIS — M1712 Unilateral primary osteoarthritis, left knee: Principal | ICD-10-CM | POA: Diagnosis present

## 2016-10-23 DIAGNOSIS — Z87891 Personal history of nicotine dependence: Secondary | ICD-10-CM | POA: Diagnosis not present

## 2016-10-23 DIAGNOSIS — F329 Major depressive disorder, single episode, unspecified: Secondary | ICD-10-CM | POA: Diagnosis present

## 2016-10-23 DIAGNOSIS — Z888 Allergy status to other drugs, medicaments and biological substances status: Secondary | ICD-10-CM | POA: Diagnosis not present

## 2016-10-23 DIAGNOSIS — Z6841 Body Mass Index (BMI) 40.0 and over, adult: Secondary | ICD-10-CM | POA: Diagnosis not present

## 2016-10-23 DIAGNOSIS — D696 Thrombocytopenia, unspecified: Secondary | ICD-10-CM | POA: Diagnosis not present

## 2016-10-23 DIAGNOSIS — Z471 Aftercare following joint replacement surgery: Secondary | ICD-10-CM | POA: Diagnosis not present

## 2016-10-23 DIAGNOSIS — Z8542 Personal history of malignant neoplasm of other parts of uterus: Secondary | ICD-10-CM

## 2016-10-23 DIAGNOSIS — R110 Nausea: Secondary | ICD-10-CM | POA: Diagnosis not present

## 2016-10-23 DIAGNOSIS — T402X5A Adverse effect of other opioids, initial encounter: Secondary | ICD-10-CM | POA: Diagnosis not present

## 2016-10-23 DIAGNOSIS — E871 Hypo-osmolality and hyponatremia: Secondary | ICD-10-CM | POA: Diagnosis present

## 2016-10-23 DIAGNOSIS — M25562 Pain in left knee: Secondary | ICD-10-CM | POA: Diagnosis not present

## 2016-10-23 DIAGNOSIS — R0902 Hypoxemia: Secondary | ICD-10-CM

## 2016-10-23 DIAGNOSIS — Z9981 Dependence on supplemental oxygen: Secondary | ICD-10-CM

## 2016-10-23 DIAGNOSIS — J9601 Acute respiratory failure with hypoxia: Secondary | ICD-10-CM | POA: Diagnosis not present

## 2016-10-23 DIAGNOSIS — M170 Bilateral primary osteoarthritis of knee: Secondary | ICD-10-CM | POA: Diagnosis not present

## 2016-10-23 DIAGNOSIS — E662 Morbid (severe) obesity with alveolar hypoventilation: Secondary | ICD-10-CM | POA: Diagnosis not present

## 2016-10-23 DIAGNOSIS — N179 Acute kidney failure, unspecified: Secondary | ICD-10-CM | POA: Diagnosis not present

## 2016-10-23 DIAGNOSIS — Z880 Allergy status to penicillin: Secondary | ICD-10-CM

## 2016-10-23 DIAGNOSIS — J9600 Acute respiratory failure, unspecified whether with hypoxia or hypercapnia: Secondary | ICD-10-CM | POA: Diagnosis not present

## 2016-10-23 DIAGNOSIS — F1110 Opioid abuse, uncomplicated: Secondary | ICD-10-CM | POA: Diagnosis not present

## 2016-10-23 DIAGNOSIS — I517 Cardiomegaly: Secondary | ICD-10-CM | POA: Diagnosis not present

## 2016-10-23 DIAGNOSIS — Z96652 Presence of left artificial knee joint: Secondary | ICD-10-CM | POA: Diagnosis not present

## 2016-10-23 HISTORY — DX: Acute kidney failure, unspecified: N17.9

## 2016-10-23 HISTORY — DX: Hypo-osmolality and hyponatremia: E87.1

## 2016-10-23 HISTORY — DX: Anemia, unspecified: D64.9

## 2016-10-23 HISTORY — DX: Thrombocytopenia, unspecified: D69.6

## 2016-10-23 HISTORY — DX: Unilateral primary osteoarthritis, left knee: M17.12

## 2016-10-23 HISTORY — DX: Unspecified osteoarthritis, unspecified site: M19.90

## 2016-10-23 HISTORY — PX: KNEE ARTHROPLASTY: SHX992

## 2016-10-23 SURGERY — ARTHROPLASTY, KNEE, TOTAL, USING IMAGELESS COMPUTER-ASSISTED NAVIGATION
Anesthesia: Regional | Laterality: Left

## 2016-10-23 MED ORDER — PHENOL 1.4 % MT LIQD: 1.0000 | OROMUCOSAL | Status: DC | PRN

## 2016-10-23 MED ORDER — FENTANYL CITRATE (PF) 100 MCG/2ML IJ SOLN
INTRAMUSCULAR | Status: DC | PRN
  Administered 2016-10-23 (×3): 50 ug via INTRAVENOUS

## 2016-10-23 MED ORDER — ONDANSETRON HCL 4 MG/2ML IJ SOLN: 4.0000 mg | Freq: Four times a day (QID) | INTRAMUSCULAR | Status: DC | PRN

## 2016-10-23 MED ORDER — DEXAMETHASONE SODIUM PHOSPHATE 10 MG/ML IJ SOLN
INTRAMUSCULAR | Status: AC
  Filled 2016-10-23: qty 1

## 2016-10-23 MED ORDER — ALUM & MAG HYDROXIDE-SIMETH 200-200-20 MG/5ML PO SUSP: 30.0000 mL | ORAL | Status: DC | PRN

## 2016-10-23 MED ORDER — ALBUTEROL SULFATE 0.63 MG/3ML IN NEBU: 1.0000 | INHALATION_SOLUTION | Freq: Four times a day (QID) | RESPIRATORY_TRACT | Status: DC | PRN

## 2016-10-23 MED ORDER — ASPIRIN 81 MG PO CHEW
81.0000 mg | CHEWABLE_TABLET | Freq: Two times a day (BID) | ORAL | Status: DC
Start: 2016-10-23 — End: 2016-11-01
  Administered 2016-10-23 – 2016-10-31 (×17): 81 mg via ORAL
  Filled 2016-10-23 (×17): qty 1

## 2016-10-23 MED ORDER — PHENYLEPHRINE HCL 10 MG/ML IJ SOLN
INTRAVENOUS | Status: DC | PRN
  Administered 2016-10-23: 25 ug/min via INTRAVENOUS

## 2016-10-23 MED ORDER — METOCLOPRAMIDE HCL 5 MG/ML IJ SOLN: 5.0000 mg | Freq: Three times a day (TID) | INTRAMUSCULAR | Status: DC | PRN

## 2016-10-23 MED ORDER — KETOROLAC TROMETHAMINE 30 MG/ML IJ SOLN
INTRAMUSCULAR | Status: AC
  Filled 2016-10-23: qty 1

## 2016-10-23 MED ORDER — METHOCARBAMOL 500 MG PO TABS
ORAL_TABLET | ORAL | Status: AC
  Filled 2016-10-23: qty 1

## 2016-10-23 MED ORDER — VANCOMYCIN HCL IN DEXTROSE 1-5 GM/200ML-% IV SOLN
1000.0000 mg | Freq: Two times a day (BID) | INTRAVENOUS | Status: AC
  Administered 2016-10-23: 1000 mg via INTRAVENOUS
  Filled 2016-10-23: qty 200

## 2016-10-23 MED ORDER — BUPIVACAINE-EPINEPHRINE (PF) 0.5% -1:200000 IJ SOLN
INTRAMUSCULAR | Status: AC
Start: 2016-10-23 — End: 2016-10-23
  Filled 2016-10-23: qty 30

## 2016-10-23 MED ORDER — ALBUTEROL SULFATE (2.5 MG/3ML) 0.083% IN NEBU: 2.5000 mg | INHALATION_SOLUTION | Freq: Four times a day (QID) | RESPIRATORY_TRACT | Status: DC | PRN

## 2016-10-23 MED ORDER — OXYCODONE HCL 5 MG/5ML PO SOLN: 5.0000 mg | Freq: Once | ORAL | Status: DC | PRN

## 2016-10-23 MED ORDER — GABAPENTIN 300 MG PO CAPS
300.0000 mg | ORAL_CAPSULE | Freq: Three times a day (TID) | ORAL | Status: DC
  Administered 2016-10-23 – 2016-10-25 (×5): 300 mg via ORAL
  Filled 2016-10-23 (×5): qty 1

## 2016-10-23 MED ORDER — LIDOCAINE 2% (20 MG/ML) 5 ML SYRINGE
INTRAMUSCULAR | Status: AC
  Filled 2016-10-23: qty 5

## 2016-10-23 MED ORDER — EPHEDRINE SULFATE 50 MG/ML IJ SOLN
INTRAMUSCULAR | Status: DC | PRN
  Administered 2016-10-23 (×4): 5 mg via INTRAVENOUS
  Administered 2016-10-23: 10 mg via INTRAVENOUS
  Administered 2016-10-23: 5 mg via INTRAVENOUS

## 2016-10-23 MED ORDER — SODIUM CHLORIDE 0.9 % IV SOLN
INTRAVENOUS | Status: DC
  Administered 2016-10-23 – 2016-10-24 (×3): via INTRAVENOUS
  Administered 2016-10-25: 500 mL via INTRAVENOUS

## 2016-10-23 MED ORDER — DIVALPROEX SODIUM 500 MG PO DR TAB
500.0000 mg | DELAYED_RELEASE_TABLET | Freq: Two times a day (BID) | ORAL | Status: DC
  Administered 2016-10-23 – 2016-10-31 (×17): 500 mg via ORAL
  Filled 2016-10-23 (×18): qty 1

## 2016-10-23 MED ORDER — STERILE WATER FOR IRRIGATION IR SOLN
Status: DC | PRN
  Administered 2016-10-23 (×2): 1000 mL

## 2016-10-23 MED ORDER — BUPIVACAINE IN DEXTROSE 0.75-8.25 % IT SOLN
INTRATHECAL | Status: DC | PRN
  Administered 2016-10-23: 1.8 mL via INTRATHECAL

## 2016-10-23 MED ORDER — PHENYLEPHRINE HCL 10 MG/ML IJ SOLN
INTRAMUSCULAR | Status: DC | PRN
  Administered 2016-10-23: 80 ug via INTRAVENOUS

## 2016-10-23 MED ORDER — LISINOPRIL 10 MG PO TABS: 10.0000 mg | ORAL_TABLET | Freq: Every day | ORAL | Status: DC

## 2016-10-23 MED ORDER — HYDROMORPHONE HCL 2 MG PO TABS
2.0000 mg | ORAL_TABLET | ORAL | Status: DC | PRN
  Administered 2016-10-23 (×2): 4 mg via ORAL
  Filled 2016-10-23 (×2): qty 2

## 2016-10-23 MED ORDER — FENTANYL CITRATE (PF) 250 MCG/5ML IJ SOLN
INTRAMUSCULAR | Status: AC
  Filled 2016-10-23: qty 5

## 2016-10-23 MED ORDER — PHENYLEPHRINE 40 MCG/ML (10ML) SYRINGE FOR IV PUSH (FOR BLOOD PRESSURE SUPPORT)
PREFILLED_SYRINGE | INTRAVENOUS | Status: AC
  Filled 2016-10-23: qty 10

## 2016-10-23 MED ORDER — DIPHENHYDRAMINE HCL 12.5 MG/5ML PO ELIX: 12.5000 mg | ORAL_SOLUTION | ORAL | Status: DC | PRN

## 2016-10-23 MED ORDER — PROMETHAZINE HCL 25 MG/ML IJ SOLN: 6.2500 mg | INTRAMUSCULAR | Status: DC | PRN

## 2016-10-23 MED ORDER — HYDROMORPHONE HCL 1 MG/ML IJ SOLN
INTRAMUSCULAR | Status: AC
  Filled 2016-10-23: qty 0.5

## 2016-10-23 MED ORDER — HYDROMORPHONE HCL 1 MG/ML IJ SOLN
0.5000 mg | INTRAMUSCULAR | Status: DC | PRN
  Administered 2016-10-23 – 2016-10-24 (×2): 1 mg via INTRAVENOUS
  Filled 2016-10-23 (×2): qty 1

## 2016-10-23 MED ORDER — DOCUSATE SODIUM 100 MG PO CAPS
100.0000 mg | ORAL_CAPSULE | Freq: Two times a day (BID) | ORAL | Status: DC
  Administered 2016-10-23 – 2016-10-31 (×17): 100 mg via ORAL
  Filled 2016-10-23 (×18): qty 1

## 2016-10-23 MED ORDER — BUPIVACAINE-EPINEPHRINE (PF) 0.5% -1:200000 IJ SOLN
INTRAMUSCULAR | Status: DC | PRN
  Administered 2016-10-23: 30 mL via PERINEURAL

## 2016-10-23 MED ORDER — EPHEDRINE 5 MG/ML INJ
INTRAVENOUS | Status: AC
  Filled 2016-10-23: qty 10

## 2016-10-23 MED ORDER — POVIDONE-IODINE 10 % EX SWAB: 2.0000 "application " | Freq: Once | CUTANEOUS | Status: DC

## 2016-10-23 MED ORDER — ROPIVACAINE HCL 5 MG/ML IJ SOLN
INTRAMUSCULAR | Status: DC | PRN
  Administered 2016-10-23: 30 mL via PERINEURAL

## 2016-10-23 MED ORDER — ONDANSETRON HCL 4 MG/2ML IJ SOLN
INTRAMUSCULAR | Status: AC
  Filled 2016-10-23: qty 2

## 2016-10-23 MED ORDER — CLONAZEPAM 0.5 MG PO TABS
0.5000 mg | ORAL_TABLET | Freq: Three times a day (TID) | ORAL | Status: DC
  Administered 2016-10-23 – 2016-10-25 (×5): 0.5 mg via ORAL
  Filled 2016-10-23 (×5): qty 1

## 2016-10-23 MED ORDER — CHLORHEXIDINE GLUCONATE 4 % EX LIQD: 60.0000 mL | Freq: Once | CUTANEOUS | Status: DC

## 2016-10-23 MED ORDER — LACTATED RINGERS IV SOLN
INTRAVENOUS | Status: DC | PRN
  Administered 2016-10-23 (×3): via INTRAVENOUS

## 2016-10-23 MED ORDER — HYDROMORPHONE HCL 2 MG PO TABS
ORAL_TABLET | ORAL | Status: AC
  Filled 2016-10-23: qty 2

## 2016-10-23 MED ORDER — ACETAMINOPHEN 325 MG PO TABS
650.0000 mg | ORAL_TABLET | Freq: Four times a day (QID) | ORAL | Status: DC | PRN
  Administered 2016-10-25 – 2016-10-30 (×7): 650 mg via ORAL
  Filled 2016-10-23 (×8): qty 2

## 2016-10-23 MED ORDER — LURASIDONE HCL 40 MG PO TABS
120.0000 mg | ORAL_TABLET | Freq: Every day | ORAL | Status: DC
  Filled 2016-10-23: qty 3

## 2016-10-23 MED ORDER — POLYETHYLENE GLYCOL 3350 17 G PO PACK
17.0000 g | PACK | Freq: Every day | ORAL | Status: DC | PRN
  Administered 2016-10-24 – 2016-10-31 (×3): 17 g via ORAL
  Filled 2016-10-23 (×3): qty 1

## 2016-10-23 MED ORDER — SENNA 8.6 MG PO TABS
2.0000 | ORAL_TABLET | Freq: Every day | ORAL | Status: DC
  Administered 2016-10-23 – 2016-10-31 (×9): 17.2 mg via ORAL
  Filled 2016-10-23 (×9): qty 2

## 2016-10-23 MED ORDER — LURASIDONE HCL 40 MG PO TABS
120.0000 mg | ORAL_TABLET | Freq: Every day | ORAL | Status: DC
  Administered 2016-10-23 – 2016-10-31 (×8): 120 mg via ORAL
  Filled 2016-10-23 (×10): qty 3

## 2016-10-23 MED ORDER — METHOCARBAMOL 1000 MG/10ML IJ SOLN: 500.0000 mg | Freq: Four times a day (QID) | INTRAVENOUS | Status: DC | PRN

## 2016-10-23 MED ORDER — CITALOPRAM HYDROBROMIDE 40 MG PO TABS
40.0000 mg | ORAL_TABLET | Freq: Every day | ORAL | Status: DC
  Administered 2016-10-23 – 2016-10-31 (×9): 40 mg via ORAL
  Filled 2016-10-23 (×4): qty 1
  Filled 2016-10-23: qty 2
  Filled 2016-10-23: qty 1
  Filled 2016-10-23 (×3): qty 2
  Filled 2016-10-23: qty 1

## 2016-10-23 MED ORDER — ALBUMIN HUMAN 5 % IV SOLN
INTRAVENOUS | Status: DC | PRN
  Administered 2016-10-23: 10:00:00 via INTRAVENOUS

## 2016-10-23 MED ORDER — SODIUM CHLORIDE 0.9 % IJ SOLN
INTRAMUSCULAR | Status: DC | PRN
  Administered 2016-10-23: 10 mL

## 2016-10-23 MED ORDER — MIDAZOLAM HCL 5 MG/5ML IJ SOLN
INTRAMUSCULAR | Status: DC | PRN
  Administered 2016-10-23 (×2): 1 mg via INTRAVENOUS

## 2016-10-23 MED ORDER — ACETAMINOPHEN 650 MG RE SUPP: 650.0000 mg | Freq: Four times a day (QID) | RECTAL | Status: DC | PRN

## 2016-10-23 MED ORDER — PROPOFOL 10 MG/ML IV BOLUS
INTRAVENOUS | Status: AC
  Filled 2016-10-23: qty 20

## 2016-10-23 MED ORDER — PROPOFOL 10 MG/ML IV BOLUS
INTRAVENOUS | Status: DC | PRN
  Administered 2016-10-23 (×2): 20 mg via INTRAVENOUS

## 2016-10-23 MED ORDER — GLYCOPYRROLATE 0.2 MG/ML IJ SOLN
INTRAMUSCULAR | Status: DC | PRN
  Administered 2016-10-23: 0.2 mg via INTRAVENOUS

## 2016-10-23 MED ORDER — ONDANSETRON HCL 4 MG/2ML IJ SOLN
INTRAMUSCULAR | Status: DC | PRN
  Administered 2016-10-23: 4 mg via INTRAVENOUS

## 2016-10-23 MED ORDER — 0.9 % SODIUM CHLORIDE (POUR BTL) OPTIME
TOPICAL | Status: DC | PRN
  Administered 2016-10-23: 1000 mL

## 2016-10-23 MED ORDER — MIDAZOLAM HCL 2 MG/2ML IJ SOLN
INTRAMUSCULAR | Status: AC
  Filled 2016-10-23: qty 2

## 2016-10-23 MED ORDER — KETOROLAC TROMETHAMINE 15 MG/ML IJ SOLN
15.0000 mg | Freq: Four times a day (QID) | INTRAMUSCULAR | Status: AC
  Administered 2016-10-23 – 2016-10-24 (×4): 15 mg via INTRAVENOUS
  Filled 2016-10-23 (×4): qty 1

## 2016-10-23 MED ORDER — METOCLOPRAMIDE HCL 5 MG PO TABS: 5.0000 mg | ORAL_TABLET | Freq: Three times a day (TID) | ORAL | Status: DC | PRN

## 2016-10-23 MED ORDER — ONDANSETRON HCL 4 MG PO TABS
4.0000 mg | ORAL_TABLET | Freq: Four times a day (QID) | ORAL | Status: DC | PRN
Start: 2016-10-23 — End: 2016-11-01

## 2016-10-23 MED ORDER — METHOCARBAMOL 500 MG PO TABS
500.0000 mg | ORAL_TABLET | Freq: Four times a day (QID) | ORAL | Status: DC | PRN
  Administered 2016-10-23 – 2016-10-26 (×3): 500 mg via ORAL
  Filled 2016-10-23 (×2): qty 1

## 2016-10-23 MED ORDER — HYDROMORPHONE HCL 1 MG/ML IJ SOLN
0.2500 mg | INTRAMUSCULAR | Status: DC | PRN
  Administered 2016-10-23: 0.5 mg via INTRAVENOUS

## 2016-10-23 MED ORDER — KETOROLAC TROMETHAMINE 30 MG/ML IJ SOLN
INTRAMUSCULAR | Status: DC | PRN
  Administered 2016-10-23: 30 mg

## 2016-10-23 MED ORDER — MENTHOL 3 MG MT LOZG: 1.0000 | LOZENGE | OROMUCOSAL | Status: DC | PRN

## 2016-10-23 MED ORDER — DEXAMETHASONE SODIUM PHOSPHATE 10 MG/ML IJ SOLN
10.0000 mg | Freq: Once | INTRAMUSCULAR | Status: AC
  Administered 2016-10-24: 10 mg via INTRAVENOUS
  Filled 2016-10-23: qty 1

## 2016-10-23 MED ORDER — DEXAMETHASONE SODIUM PHOSPHATE 10 MG/ML IJ SOLN
INTRAMUSCULAR | Status: DC | PRN
  Administered 2016-10-23: 10 mg via INTRAVENOUS

## 2016-10-23 MED ORDER — TRANEXAMIC ACID 1000 MG/10ML IV SOLN
1000.0000 mg | Freq: Once | INTRAVENOUS | Status: AC
  Administered 2016-10-23: 1000 mg via INTRAVENOUS
  Filled 2016-10-23: qty 10

## 2016-10-23 MED ORDER — PROPOFOL 500 MG/50ML IV EMUL
INTRAVENOUS | Status: DC | PRN
  Administered 2016-10-23: 50 ug/kg/min via INTRAVENOUS

## 2016-10-23 MED ORDER — OXYCODONE HCL 5 MG PO TABS: 5.0000 mg | ORAL_TABLET | Freq: Once | ORAL | Status: DC | PRN

## 2016-10-23 SURGICAL SUPPLY — 51 items
ALCOHOL ISOPROPYL (RUBBING) (MISCELLANEOUS) ×3 IMPLANT
BANDAGE ACE 6X5 VEL STRL LF (GAUZE/BANDAGES/DRESSINGS) ×3 IMPLANT
BASEPLATE TIBIAL UNV SZ5 (Plate) ×3 IMPLANT
BLADE SAW RECIP 87.9 MT (BLADE) ×3 IMPLANT
BONE CEMENT SIMPLEX TOBRAMYCIN (Cement) ×4 IMPLANT
CAPT KNEE TRIATH TK-4 ×3 IMPLANT
CEMENT BONE SIMPLEX TOBRAMYCIN (Cement) ×2 IMPLANT
CHLORAPREP W/TINT 26ML (MISCELLANEOUS) ×3 IMPLANT
CUFF TOURNIQUET SINGLE 34IN LL (TOURNIQUET CUFF) ×3 IMPLANT
DERMABOND ADVANCED (GAUZE/BANDAGES/DRESSINGS) ×2
DERMABOND ADVANCED .7 DNX12 (GAUZE/BANDAGES/DRESSINGS) ×1 IMPLANT
DRAIN HEMOVAC 7FR (DRAIN) IMPLANT
DRAPE SHEET LG 3/4 BI-LAMINATE (DRAPES) ×6 IMPLANT
DRAPE U-SHAPE 47X51 STRL (DRAPES) ×3 IMPLANT
DRAPE UNIVERSAL PACK (DRAPES) ×3 IMPLANT
DRSG AQUACEL AG ADV 3.5X 6 (GAUZE/BANDAGES/DRESSINGS) ×3 IMPLANT
DRSG AQUACEL AG ADV 3.5X14 (GAUZE/BANDAGES/DRESSINGS) ×3 IMPLANT
DRSG TEGADERM 2-3/8X2-3/4 SM (GAUZE/BANDAGES/DRESSINGS) ×3 IMPLANT
ELECT REM PT RETURN 9FT ADLT (ELECTROSURGICAL) ×3
ELECTRODE REM PT RTRN 9FT ADLT (ELECTROSURGICAL) ×1 IMPLANT
EVACUATOR 1/8 PVC DRAIN (DRAIN) IMPLANT
GLOVE BIO SURGEON STRL SZ8.5 (GLOVE) ×12 IMPLANT
GLOVE BIOGEL PI IND STRL 8.5 (GLOVE) ×1 IMPLANT
GLOVE BIOGEL PI INDICATOR 8.5 (GLOVE) ×2
GOWN STRL REUS W/TWL 2XL LVL3 (GOWN DISPOSABLE) ×6 IMPLANT
HANDPIECE INTERPULSE COAX TIP (DISPOSABLE) ×2
KIT BASIN OR (CUSTOM PROCEDURE TRAY) ×3 IMPLANT
MANIFOLD NEPTUNE II (INSTRUMENTS) ×3 IMPLANT
NEEDLE SPNL 18GX3.5 QUINCKE PK (NEEDLE) ×6 IMPLANT
NS IRRIG 1000ML POUR BTL (IV SOLUTION) ×6 IMPLANT
PACK TOTAL JOINT (CUSTOM PROCEDURE TRAY) ×3 IMPLANT
PACK TOTAL KNEE CUSTOM (KITS) ×3 IMPLANT
PADDING CAST ABS 6INX4YD NS (CAST SUPPLIES) ×2
PADDING CAST ABS COTTON 6X4 NS (CAST SUPPLIES) ×1 IMPLANT
PADDING CAST COTTON 6X4 STRL (CAST SUPPLIES) ×3 IMPLANT
SAW OSC TIP CART 19.5X105X1.3 (SAW) ×3 IMPLANT
SEALER BIPOLAR AQUA 6.0 (INSTRUMENTS) ×3 IMPLANT
SET HNDPC FAN SPRY TIP SCT (DISPOSABLE) ×1 IMPLANT
SET PAD KNEE POSITIONER (MISCELLANEOUS) ×3 IMPLANT
SUT MNCRL AB 3-0 PS2 27 (SUTURE) ×3 IMPLANT
SUT MON AB 2-0 CT1 36 (SUTURE) ×6 IMPLANT
SUT VIC AB 1 CTX 27 (SUTURE) ×3 IMPLANT
SUT VIC AB 2-0 CT1 27 (SUTURE) ×2
SUT VIC AB 2-0 CT1 TAPERPNT 27 (SUTURE) ×1 IMPLANT
SUT VLOC 180 0 24IN GS25 (SUTURE) ×3 IMPLANT
SYR 50ML LL SCALE MARK (SYRINGE) ×3 IMPLANT
TOWER CARTRIDGE SMART MIX (DISPOSABLE) ×3 IMPLANT
TRAY CATH 16FR W/PLASTIC CATH (SET/KITS/TRAYS/PACK) ×3 IMPLANT
WATER STERILE IRR 1000ML POUR (IV SOLUTION) ×3 IMPLANT
WRAP KNEE MAXI GEL POST OP (GAUZE/BANDAGES/DRESSINGS) ×3 IMPLANT
tibial component IMPLANT

## 2016-10-23 NOTE — Transfer of Care (Signed)
Immediate Anesthesia Transfer of Care Note  Patient: Diana Oneill  Procedure(s) Performed: Procedure(s): COMPUTER ASSISTED TOTAL KNEE ARTHROPLASTY (Left)  Patient Location: PACU  Anesthesia Type:MAC and Spinal  Level of Consciousness: awake, oriented and patient cooperative  Airway & Oxygen Therapy: Patient Spontanous Breathing and Patient connected to nasal cannula oxygen  Post-op Assessment: Report given to RN and Post -op Vital signs reviewed and stable  Post vital signs: Reviewed  Last Vitals:  Vitals:   10/23/16 0626  BP: 139/73  Pulse: (!) 53  Resp: 18  Temp: 36.3 C    Last Pain:  Vitals:   10/23/16 0626  TempSrc: Oral         Complications: No apparent anesthesia complications

## 2016-10-23 NOTE — Op Note (Signed)
OPERATIVE REPORT  SURGEON: Rod Can, MD   ASSISTANT: Ky Barban, RNFA.  PREOPERATIVE DIAGNOSIS: Left knee arthritis.   POSTOPERATIVE DIAGNOSIS: Left knee arthritis.   PROCEDURE: Left total knee arthroplasty.   IMPLANTS: Stryker Triathlon CR femur, size 4. Stryker Universal tibia, size 5. X3 polyethelyene insert, size 9 mm, CS. 3 button asymmetric patella, size 32 mm. Simplex P bone cement.  ANESTHESIA:  Spinal  TOURNIQUET TIME: Not utilized.   ESTIMATED BLOOD LOSS: 400 mL.   ANTIBIOTICS: 1.5 g vancomycin.  DRAINS: None.  COMPLICATIONS: None   CONDITION: PACU - hemodynamically stable.   BRIEF CLINICAL NOTE: Diana Oneill is a 61 y.o. female with a long-standing history of Left knee arthritis. After failing conservative management, the patient was indicated for total knee arthroplasty. The risks, benefits, and alternatives to the procedure were explained, and the patient elected to proceed.  PROCEDURE IN DETAIL: Adductor canal block was obtained in the pre-op holding area. Once inside the operative room, spinal anesthesia was obtained, and a foley catheter was inserted. The patient was then positioned, a nonsterile tourniquet was placed, and the lower extremity was prepped and draped in the normal sterile surgical fashion. A time-out was called verifying side and site of surgery. The patient received IV antibiotics within 60 minutes of beginning the procedure. The tourniquet was not utilized.  An anterior approach to the knee was performed utilizing a midvastus arthrotomy. A medial release was performed and the patellar fat pad was excised. Stryker navigation was used to cut the distal femur perpendicular to the mechanical axis. A freehand patellar resection was performed, and the patella was sized and prepared with 3 lug holes.  Nagivation was used to make a  neutral proximal tibia resection, taking 6 mm of bone from the less affected medial side with 3 degrees of slope. The menisci were excised. A spacer block was placed, and the alignment and balance in extension were confirmed.   The distal femur was sized using the 3-degree external rotation guide referencing the posterior femoral cortex. The appropriate 4-in-1 cutting block was pinned into place. Rotation was checked using Whiteside's line, the epicondylar axis, and then confirmed with a spacer block in flexion. The remaining femoral cuts were performed, taking care to protect the MCL.  The tibia was sized and the trial tray was pinned into place. The remaining trail components were inserted. The knee was stable to varus and valgus stress through a full range of motion. The patella tracked centrally, and the PCL was well balanced. The trial components were removed, and the proximal tibial surface was prepared. The real tibial component was impacted into place. There was mild subsidence, so I removed the implant. I prepared the tibia for a cemented implant. The tourniquet was inflated. I cemented the tibia into place. Excess cement was cleared.  The real poly liner was impacted into place. The femur and patella were press fit. The knee was tested for a final time and found to be well balanced.  The wound was copiously irrigated with normal saline solution followed by normal saline with pulse lavage. Marcaine solution was injected into the periarticular soft tissue. The wound was closed in layers using #1 Vicryl and Stratafix for the fascia, 2-0 Vicryl for the subcutaneous fat, 2-0 Monocryl for the deep dermal layer, 3-0 running Monocryl subcuticular Stitch, and Dermabond for the skin. Once the glue was fully dried, an Aquacell Ag and compressive dressing were applied. Tthe patient was transported to the recovery room in stable  condition. Sponge, needle, and instrument counts were correct at the end of the  case x2. The patient tolerated the procedure well and there were no known complications.

## 2016-10-23 NOTE — Anesthesia Procedure Notes (Signed)
Anesthesia Regional Block: Adductor canal block   Pre-Anesthetic Checklist: ,, timeout performed, Correct Patient, Correct Site, Correct Laterality, Correct Procedure,, site marked, risks and benefits discussed, Surgical consent,  Pre-op evaluation,  At surgeon's request and post-op pain management  Laterality: Left  Prep: chloraprep       Needles:  Injection technique: Single-shot  Needle Type: Echogenic Stimulator Needle     Needle Length: 9cm  Needle Gauge: 21     Additional Needles:   Procedures: ultrasound guided,,,,,,,,  Narrative:  Start time: 10/23/2016 7:05 AM End time: 10/23/2016 7:15 AM Injection made incrementally with aspirations every 5 mL.  Performed by: Personally  Anesthesiologist: Adele Barthel P  Additional Notes: Functioning IV was confirmed and monitors were applied.  A 37mm 21ga Arrow echogenic stimulator needle was used. Sterile prep and drape,hand hygiene and sterile gloves were used.  Negative aspiration and negative test dose prior to incremental administration of local anesthetic. The patient tolerated the procedure well.

## 2016-10-23 NOTE — Anesthesia Procedure Notes (Signed)
Spinal  Patient location during procedure: OR Start time: 10/23/2016 7:35 AM End time: 10/23/2016 7:45 AM Staffing Anesthesiologist: Adele Barthel P Performed: anesthesiologist  Preanesthetic Checklist Completed: patient identified, surgical consent, pre-op evaluation, timeout performed, IV checked, risks and benefits discussed and monitors and equipment checked Spinal Block Patient position: sitting Prep: DuraPrep Patient monitoring: cardiac monitor, continuous pulse ox and blood pressure Approach: midline Location: L4-5 Injection technique: single-shot Needle Needle type: Pencan  Needle gauge: 24 G Needle length: 9 cm Assessment Sensory level: T10 Additional Notes Functioning IV was confirmed and monitors were applied. Sterile prep and drape, including hand hygiene and sterile gloves were used. The patient was positioned and the spine was prepped. The skin was anesthetized with lidocaine.  Free flow of clear CSF was obtained prior to injecting local anesthetic into the CSF.  The spinal needle aspirated freely following injection.  The needle was carefully withdrawn.  The patient tolerated the procedure well.

## 2016-10-23 NOTE — Anesthesia Procedure Notes (Signed)
Procedure Name: MAC Date/Time: 10/23/2016 7:55 AM Performed by: Jenne Campus Pre-anesthesia Checklist: Patient identified, Emergency Drugs available, Suction available, Patient being monitored and Timeout performed Oxygen Delivery Method: Simple face mask

## 2016-10-23 NOTE — Discharge Instructions (Signed)
° °Dr. Adley Mazurowski °Total Joint Specialist °[facility] °[address] °[place], [place] [address] °[phone] ° °TOTAL KNEE REPLACEMENT POSTOPERATIVE DIRECTIONS ° ° ° °Knee Rehabilitation, Guidelines Following Surgery  °Results after knee surgery are often greatly improved when you follow the exercise, range of motion and muscle strengthening exercises prescribed by your doctor. Safety measures are also important to protect the knee from further injury. Any time any of these exercises cause you to have increased pain or swelling in your knee joint, decrease the amount until you are comfortable again and slowly increase them. If you have problems or questions, call your caregiver or physical therapist for advice.  ° °WEIGHT BEARING °Weight bearing as tolerated with assist device (walker, cane, etc) as directed, use it as long as suggested by your surgeon or therapist, typically at least 4-6 weeks. ° °HOME CARE INSTRUCTIONS  °Remove items at home which could result in a fall. This includes throw rugs or furniture in walking pathways.  °Continue medications as instructed at time of discharge. °You may have some home medications which will be placed on hold until you complete the course of blood thinner medication.  °You may start showering once you are discharged home but do not submerge the incision under water. Just pat the incision dry and apply a dry gauze dressing on daily. °Walk with walker as instructed.  °You may resume a sexual relationship in one month or when given the OK by your doctor.  °· Use walker as long as suggested by your caregivers. °· Avoid periods of inactivity such as sitting longer than an hour when not asleep. This helps prevent blood clots.  °You may put full weight on your legs and walk as much as is comfortable.  °You may return to work once you are cleared by your doctor.  °Do not drive a car for 6 weeks or until released by you surgeon.  °· Do not drive  while taking narcotics.  °Wear the elastic stockings for three weeks following surgery during the day but you may remove then at night. °Make sure you keep all of your appointments after your operation with all of your doctors and caregivers. You should call the office at the above phone number and make an appointment for approximately two weeks after the date of your surgery. °Do not remove your surgical dressing. The dressing is waterproof; you may take showers in 3 days, but do not take tub baths or submerge the dressing. °Please pick up a stool softener and laxative for home use as long as you are requiring pain medications. °· ICE to the affected knee every three hours for 30 minutes at a time and then as needed for pain and swelling.  Continue to use ice on the knee for pain and swelling from surgery. You may notice swelling that will progress down to the foot and ankle.  This is normal after surgery.  Elevate the leg when you are not up walking on it.   °It is important for you to complete the blood thinner medication as prescribed by your doctor. °· Continue to use the breathing machine which will help keep your temperature down.  It is common for your temperature to cycle up and down following surgery, especially at night when you are not up moving around and exerting yourself.  The breathing machine keeps your lungs expanded and your temperature down. ° °RANGE OF MOTION AND STRENGTHENING EXERCISES  °Rehabilitation of the knee is important following   a knee injury or an operation. After just a few days of immobilization, the muscles of the thigh which control the knee become weakened and shrink (atrophy). Knee exercises are designed to build up the tone and strength of the thigh muscles and to improve knee motion. Often times heat used for twenty to thirty minutes before working out will loosen up your tissues and help with improving the range of motion but do not use heat for the first two weeks following  surgery. These exercises can be done on a training (exercise) mat, on the floor, on a table or on a bed. Use what ever works the best and is most comfortable for you Knee exercises include:  °Leg Lifts - While your knee is still immobilized in a splint or cast, you can do straight leg raises. Lift the leg to 60 degrees, hold for 3 sec, and slowly lower the leg. Repeat 10-20 times 2-3 times daily. Perform this exercise against resistance later as your knee gets better.  °Quad and Hamstring Sets - Tighten up the muscle on the front of the thigh (Quad) and hold for 5-10 sec. Repeat this 10-20 times hourly. Hamstring sets are done by pushing the foot backward against an object and holding for 5-10 sec. Repeat as with quad sets.  °A rehabilitation program following serious knee injuries can speed recovery and prevent re-injury in the future due to weakened muscles. Contact your doctor or a physical therapist for more information on knee rehabilitation.  ° °SKILLED REHAB INSTRUCTIONS: °If the patient is transferred to a skilled rehab facility following release from the hospital, a list of the current medications will be sent to the facility for the patient to continue.  When discharged from the skilled rehab facility, please have the facility set up the patient's Home Health Physical Therapy prior to being released. Also, the skilled facility will be responsible for providing the patient with their medications at time of release from the facility to include their pain medication, the muscle relaxants, and their blood thinner medication. If the patient is still at the rehab facility at time of the two week follow up appointment, the skilled rehab facility will also need to assist the patient in arranging follow up appointment in our office and any transportation needs. ° °MAKE SURE YOU:  °Understand these instructions.  °Will watch your condition.  °Will get help right away if you are not doing well or get worse.   ° ° °Pick up stool softner and laxative for home use following surgery while on pain medications. °Do NOT remove your dressing. You may shower.  °Do not take tub baths or submerge incision under water. °May shower starting three days after surgery. °Please use a clean towel to pat the incision dry following showers. °Continue to use ice for pain and swelling after surgery. °Do not use any lotions or creams on the incision until instructed by your surgeon. ° °

## 2016-10-23 NOTE — H&P (View-Only) (Signed)
TOTAL KNEE ADMISSION H&P  Patient is being admitted for left total knee arthroplasty.  Subjective:  Chief Complaint:left knee pain.  HPI: Diana Oneill, 61 y.o. female, has a history of pain and functional disability in the left knee due to arthritis and has failed non-surgical conservative treatments for greater than 12 weeks to includeNSAID's and/or analgesics, corticosteriod injections, flexibility and strengthening excercises, use of assistive devices, weight reduction as appropriate and activity modification.  Onset of symptoms was gradual, starting 5 years ago with rapidlly worsening course since that time. The patient noted no past surgery on the left knee(s).  Patient currently rates pain in the left knee(s) at 10 out of 10 with activity. Patient has night pain, worsening of pain with activity and weight bearing, pain that interferes with activities of daily living, pain with passive range of motion and joint swelling.  Patient has evidence of subchondral cysts, subchondral sclerosis, periarticular osteophytes and joint space narrowing by imaging studies. There is no active infection.  There are no active problems to display for this patient.  No past medical history on file.  No past surgical history on file.   (Not in a hospital admission) Allergies  Allergen Reactions  . Penicillins Anaphylaxis    Has patient had a PCN reaction causing immediate rash, facial/tongue/throat swelling, SOB or lightheadedness with hypotension: Yes Has patient had a PCN reaction causing severe rash involving mucus membranes or skin necrosis: No Has patient had a PCN reaction that required hospitalization No Has patient had a PCN reaction occurring within the last 10 years: No If all of the above answers are "NO", then may proceed with Cephalosporin use.     Social History  Substance Use Topics  . Smoking status: Not on file  . Smokeless tobacco: Not on file  . Alcohol use Not on file     No family history on file.   Review of Systems  Constitutional: Negative.   HENT: Negative.   Eyes: Negative.   Respiratory: Positive for shortness of breath.   Cardiovascular: Negative.   Gastrointestinal: Negative.   Genitourinary: Negative.   Musculoskeletal: Positive for back pain and joint pain.  Skin: Negative.   Neurological: Negative.   Endo/Heme/Allergies: Positive for environmental allergies.  Psychiatric/Behavioral: Negative.     Objective:  Physical Exam  Vital signs in last 24 hours: @VSRANGES @  Labs:   There is no height or weight on file to calculate BMI.   Imaging Review Plain radiographs demonstrate severe degenerative joint disease of the left knee(s). The overall alignment issignificant valgus. The bone quality appears to be adequate for age and reported activity level.  Assessment/Plan:  End stage arthritis, left knee   The patient history, physical examination, clinical judgment of the provider and imaging studies are consistent with end stage degenerative joint disease of the left knee(s) and total knee arthroplasty is deemed medically necessary. The treatment options including medical management, injection therapy arthroscopy and arthroplasty were discussed at length. The risks and benefits of total knee arthroplasty were presented and reviewed. The risks due to aseptic loosening, infection, stiffness, patella tracking problems, thromboembolic complications and other imponderables were discussed. The patient acknowledged the explanation, agreed to proceed with the plan and consent was signed. Patient is being admitted for inpatient treatment for surgery, pain control, PT, OT, prophylactic antibiotics, VTE prophylaxis, progressive ambulation and ADL's and discharge planning. The patient is planning to be discharged home with outpatient PT ([facility]). Already has DME.

## 2016-10-23 NOTE — Interval H&P Note (Signed)
History and Physical Interval Note:  10/23/2016 7:34 AM  Diana Oneill  has presented today for surgery, with the diagnosis of Degenerative joint disease left knee  The various methods of treatment have been discussed with the patient and family. After consideration of risks, benefits and other options for treatment, the patient has consented to  Procedure(s): COMPUTER ASSISTED TOTAL KNEE ARTHROPLASTY (Left) as a surgical intervention .  The patient's history has been reviewed, patient examined, no change in status, stable for surgery.  I have reviewed the patient's chart and labs.  Questions were answered to the patient's satisfaction.     Khrystian Schauf, Horald Pollen

## 2016-10-23 NOTE — Anesthesia Postprocedure Evaluation (Signed)
Anesthesia Post Note  Patient: Diana Oneill  Procedure(s) Performed: Procedure(s) (LRB): COMPUTER ASSISTED TOTAL KNEE ARTHROPLASTY (Left)     Patient location during evaluation: PACU Anesthesia Type: Regional and Spinal Level of consciousness: oriented and awake and alert Pain management: pain level controlled Vital Signs Assessment: post-procedure vital signs reviewed and stable Respiratory status: spontaneous breathing, respiratory function stable and patient connected to nasal cannula oxygen Cardiovascular status: blood pressure returned to baseline and stable Postop Assessment: no headache and no backache Anesthetic complications: no    Last Vitals:  Vitals:   10/23/16 1224 10/23/16 1253  BP:  125/71  Pulse: 61 60  Resp: 17 18  Temp:  36.2 C    Last Pain:  Vitals:   10/23/16 1406  TempSrc:   PainSc: 8                  Maximus Hoffert P Aldean Pipe

## 2016-10-23 NOTE — Evaluation (Addendum)
Physical Therapy Evaluation Patient Details Name: Diana Oneill MRN: [id number] DOB: 08-Jul-1955 Today's Date: 10/23/2016   History of Present Illness  Pt admitted for elective L TKA. Pt with no PMH or PSH on file.  Clinical Impression  Pt is s/p TKA resulting in the deficits listed below (see PT Problem List). Pt very lethargic this date from pain medicines but was able to tolerate OOB to chair. Pt will benefit from skilled PT to increase their independence and safety with mobility to allow discharge to the venue listed below.      Follow Up Recommendations Home health PT;Supervision/Assistance - 24 hour    Equipment Recommendations  3in1 (PT)    Recommendations for Other Services       Precautions / Restrictions Precautions Precautions: Knee Precaution Booklet Issued: No Precaution Comments: pt very lethargic and unable to retain info. spouse instructed on not putting anything under the knee and quad sets Restrictions Weight Bearing Restrictions: Yes LLE Weight Bearing: Weight bearing as tolerated      Mobility  Bed Mobility Overal bed mobility: Needs Assistance Bed Mobility: Supine to Sit     Supine to sit: Min assist     General bed mobility comments: minA for LE management  Transfers Overall transfer level: Needs assistance Equipment used: Standard walker Transfers: Sit to/from Stand;Stand Pivot Transfers Sit to Stand: Min assist Stand pivot transfers: Min assist       General transfer comment: max directional v/c's due to lethargy, v/c's for hand placement and to achieve L quad set during stance phase to prevent buckling during stand pvt  Ambulation/Gait             General Gait Details: pt too lethargic  Stairs            Wheelchair Mobility    Modified Rankin (Stroke Patients Only)       Balance Overall balance assessment: Needs assistance Sitting-balance support: Feet supported;Bilateral upper extremity supported Sitting  balance-Leahy Scale: Fair     Standing balance support: During functional activity Standing balance-Leahy Scale: Fair Standing balance comment: requires RW                             Pertinent Vitals/Pain Pain Assessment: 0-10 Pain Score: 4  Pain Location: L knee Pain Descriptors / Indicators: Sore Pain Intervention(s): Monitored during session    Home Living Family/patient expects to be discharged to:: Private residence Living Arrangements: Spouse/significant other Available Help at Discharge: Family;Available PRN/intermittently (spouse works at night) Type of Home: House Home Access: Stairs to enter Entrance Stairs-Rails: Can reach both Entrance Stairs-Number of Steps: [place]: One level Home Equipment: [place] - 2 wheels;Shower seat;Cane - single point      Prior Function Level of Independence: Independent               Hand Dominance   Dominant Hand: Right    Extremity/Trunk Assessment   Upper Extremity Assessment Upper Extremity Assessment: Overall WFL for tasks assessed    Lower Extremity Assessment Lower Extremity Assessment: LLE deficits/detail LLE Deficits / Details: able to initate quad set    Cervical / Trunk Assessment Cervical / Trunk Assessment: Normal  Communication   Communication: No difficulties  Cognition Arousal/Alertness: Lethargic;Suspect due to medications Behavior During Therapy: [facility] for tasks assessed/performed Overall Cognitive Status: Within Functional Limits for tasks assessed  General Comments: pt very sleepy from pain medication but was able to answer questions appropriate. unable to maintain eye opening without verbal cues      General Comments      Exercises     Assessment/Plan    PT Assessment Patient needs continued PT services  PT Problem List Decreased strength;Decreased range of motion;Decreased activity tolerance;Decreased balance;Decreased  mobility;Decreased knowledge of use of DME;Decreased safety awareness;Pain;Obesity       PT Treatment Interventions DME instruction;Gait training;Stair training;Functional mobility training;Therapeutic activities;Therapeutic exercise    PT Goals (Current goals can be found in the Care Plan section)  Acute Rehab PT Goals Patient Stated Goal: return home PT Goal Formulation: With patient Time For Goal Achievement: 10/30/16 Potential to Achieve Goals: Good    Frequency 7X/week   Barriers to discharge Decreased caregiver support spouse works at night    Co-evaluation               AM-PAC PT "6 Clicks" Daily Activity  Outcome Measure Difficulty turning over in bed (including adjusting bedclothes, sheets and blankets)?: A Little Difficulty moving from lying on back to sitting on the side of the bed? : A Little Difficulty sitting down on and standing up from a chair with arms (e.g., wheelchair, bedside commode, etc,.)?: A Little Help needed moving to and from a bed to chair (including a wheelchair)?: A Little Help needed walking in hospital room?: A Little Help needed climbing 3-5 steps with a railing? : A Lot 6 Click Score: 17    End of Session Equipment Utilized During Treatment: Gait belt Activity Tolerance: Patient tolerated treatment well Patient left: in chair;with call bell/phone within reach;with family/visitor present Nurse Communication: Mobility status (and lethargy) PT Visit Diagnosis: Pain;Difficulty in walking, not elsewhere classified (R26.2) Pain - Right/Left: Left Pain - part of body: Knee    Time: 1454-1510 PT Time Calculation (min) (ACUTE ONLY): 16 min   Charges:   PT Evaluation $PT Eval Moderate Complexity: 1 Procedure     PT G CodesKittie Plater, PT, DPT Pager #: [phone] Office #: [phone]   [place] 10/23/2016, 3:33 PM

## 2016-10-24 ENCOUNTER — Encounter (HOSPITAL_COMMUNITY): Payer: Self-pay | Admitting: Orthopedic Surgery

## 2016-10-24 LAB — CBC
HCT: 31.3 % — ABNORMAL LOW (ref 36.0–46.0)
HEMOGLOBIN: 9.9 g/dL — AB (ref 12.0–15.0)
MCH: 27.3 pg (ref 26.0–34.0)
MCHC: 31.6 g/dL (ref 30.0–36.0)
MCV: 86.5 fL (ref 78.0–100.0)
Platelets: 155 10*3/uL (ref 150–400)
RBC: 3.62 MIL/uL — AB (ref 3.87–5.11)
RDW: 15 % (ref 11.5–15.5)
WBC: 8.2 10*3/uL (ref 4.0–10.5)

## 2016-10-24 LAB — BASIC METABOLIC PANEL
Anion gap: 10 (ref 5–15)
BUN: 30 mg/dL — AB (ref 6–20)
CHLORIDE: 104 mmol/L (ref 101–111)
CO2: 18 mmol/L — ABNORMAL LOW (ref 22–32)
Calcium: 8.3 mg/dL — ABNORMAL LOW (ref 8.9–10.3)
Creatinine, Ser: 2.4 mg/dL — ABNORMAL HIGH (ref 0.44–1.00)
GFR calc Af Amer: 24 mL/min — ABNORMAL LOW (ref >60)
GFR calc non Af Amer: 21 mL/min — ABNORMAL LOW (ref >60)
GLUCOSE: 165 mg/dL — AB (ref 65–99)
POTASSIUM: 5 mmol/L (ref 3.5–5.1)
Sodium: 132 mmol/L — ABNORMAL LOW (ref 135–145)

## 2016-10-24 MED ORDER — SODIUM CHLORIDE 0.9 % IV BOLUS (SEPSIS)
500.0000 mL | Freq: Once | INTRAVENOUS | Status: AC
  Administered 2016-10-24: 500 mL via INTRAVENOUS

## 2016-10-24 MED ORDER — ONDANSETRON HCL 4 MG PO TABS: 4.0000 mg | ORAL_TABLET | Freq: Four times a day (QID) | ORAL | 0 refills | Status: DC | PRN

## 2016-10-24 MED ORDER — HYDROMORPHONE HCL 2 MG PO TABS
2.0000 mg | ORAL_TABLET | ORAL | Status: DC | PRN
  Administered 2016-10-25 (×2): 2 mg via ORAL
  Filled 2016-10-24 (×2): qty 1

## 2016-10-24 MED ORDER — SENNA 8.6 MG PO TABS: 2.0000 | ORAL_TABLET | Freq: Every day | ORAL | 0 refills | Status: DC

## 2016-10-24 MED ORDER — NALOXONE HCL 0.4 MG/ML IJ SOLN
0.4000 mg | INTRAMUSCULAR | Status: DC | PRN
  Administered 2016-10-24: 0.4 mg via INTRAVENOUS
  Filled 2016-10-24: qty 1

## 2016-10-24 MED ORDER — ASPIRIN 81 MG PO CHEW: 81.0000 mg | CHEWABLE_TABLET | Freq: Two times a day (BID) | ORAL | 1 refills | Status: DC

## 2016-10-24 MED ORDER — HYDROMORPHONE HCL 2 MG PO TABS: 2.0000 mg | ORAL_TABLET | ORAL | 0 refills | Status: DC | PRN

## 2016-10-24 MED ORDER — NALOXONE HCL 0.4 MG/ML IJ SOLN
0.4000 mg | Freq: Once | INTRAMUSCULAR | Status: AC
  Administered 2016-10-24: 0.4 mg via INTRAVENOUS
  Filled 2016-10-24: qty 1

## 2016-10-24 MED ORDER — DOCUSATE SODIUM 100 MG PO CAPS: 100.0000 mg | ORAL_CAPSULE | Freq: Two times a day (BID) | ORAL | 1 refills | Status: DC

## 2016-10-24 NOTE — Care Management Note (Signed)
Case Management Note  Patient Details  Name: Diana Oneill MRN: [id number] Date of Birth: 1955-06-23  Subjective/Objective:    61 yr old female s/p left total knee arthroplasty.                Action/Plan: Case manager spoke with patient concerning discharge plan and DME needs. Patient will be going to outpatient therapy beginning 10/27/16. She will have family support at discharge.   Expected Discharge Date:    10/25/16              Expected Discharge Plan:  Home/Self Care  In-House Referral:  NA  Discharge planning Services  CM Consult  Post Acute Care Choice:  NA Choice offered to:  Patient  DME Arranged:   (has RW and 3in1 ) DME Agency:     HH Arranged:   Going to outpatient therapy [place]:  NA  Status of Service:  Completed, signed off  If discussed at [place] of Stay Meetings, dates discussed:    Additional Comments:  Ninfa Meeker, RN 10/24/2016, 3:00 PM

## 2016-10-24 NOTE — Progress Notes (Signed)
   Subjective:  Patient reports pain as mild to moderate.  Denies N/V/CP/SOB.  Objective:   VITALS:   Vitals:   10/23/16 1253 10/23/16 2012 10/24/16 0006 10/24/16 0444  BP: 125/71 106/61 108/68 102/66  Pulse: 60 93 99 96  Resp: 18 16 18 16   Temp: 97.1 F (36.2 C) 97.8 F (36.6 C) 97.6 F (36.4 C) 97.6 F (36.4 C)  TempSrc: Oral Oral Oral Oral  SpO2: 99% 98% 93% 94%  Weight:        NAD, sleepy but arousable ABD soft Sensation intact distally Intact pulses distally Dorsiflexion/Plantar flexion intact Incision: dressing C/D/I Compartment soft   Lab Results  Component Value Date   WBC 8.2 10/24/2016   HGB 9.9 (L) 10/24/2016   HCT 31.3 (L) 10/24/2016   MCV 86.5 10/24/2016   PLT 155 10/24/2016   BMET    Component Value Date/Time   NA 132 (L) 10/24/2016 0618   K 5.0 10/24/2016 0618   CL 104 10/24/2016 0618   CO2 18 (L) 10/24/2016 0618   GLUCOSE 165 (H) 10/24/2016 0618   BUN 30 (H) 10/24/2016 0618   CREATININE 2.40 (H) 10/24/2016 0618   CALCIUM 8.3 (L) 10/24/2016 0618   GFRNONAA 21 (L) 10/24/2016 0618   GFRAA 24 (L) 10/24/2016 0618     Assessment/Plan: 1 Day Post-Op   Principal Problem:   Osteoarthritis of left knee   WBAT with walker DVT ppx: ASA, SCDs, TEDs PO pain control: will decrease pain meds due to sleepiness, d/c IV pain meds ARF: continue IVFs, encourage PO intake, recheck in am PT/OT Dispo: will d/c home with outpatient PT when medically ready, tomorrow vs Thursday   Elie Goody 10/24/2016, 7:38 AM   Rod Can, MD Cell [phone]

## 2016-10-24 NOTE — Progress Notes (Signed)
Physical Therapy Treatment Patient Details Name: Diana Oneill MRN: [id number] DOB: 05-09-1955 Today's Date: 10/24/2016    History of Present Illness Pt admitted for elective L TKA. Pt with no PMH or PSH on file.    PT Comments    Pt remains very lethargic requiring max v/c's to attempt to maintain eye opening. Pt however was still able to tolerate ambulation of 120' with RW. Spouse educated on HEP and precautions due to patient's lethargy and poor ability to retain info. Acute PT to con't to follow. PT will address stair negotiation once patient is more awake.   Follow Up Recommendations  Home health PT;Supervision/Assistance - 24 hour     Equipment Recommendations  3in1 (PT)    Recommendations for Other Services       Precautions / Restrictions Precautions Precautions: Knee Precaution Booklet Issued: Yes (comment) Precaution Comments: pt very lethargic and unable to retain info. spouse instructed on not putting anything under the knee and quad sets Restrictions Weight Bearing Restrictions: Yes LLE Weight Bearing: Weight bearing as tolerated    Mobility  Bed Mobility Overal bed mobility: Needs Assistance Bed Mobility: Supine to Sit     Supine to sit: Min guard     General bed mobility comments: v/c's to complete task due to lethargy  Transfers Overall transfer level: Needs assistance Equipment used: Rolling walker (2 wheeled) Transfers: Sit to/from Stand Sit to Stand: Min guard         General transfer comment: v/c's for safety and hand placement  Ambulation/Gait Ambulation/Gait assistance: Min guard;+2 safety/equipment (2nd person for chair follow (husband)) Ambulation Distance (Feet): 80 Feet (x1, 60x1) Assistive device: Rolling walker (2 wheeled) Gait Pattern/deviations: Step-through pattern;Decreased stride length Gait velocity: slow Gait velocity interpretation: Below normal speed for age/gender General Gait Details: pt amb with eyes 1/2 way  closed but demo'd no knee buckling. attempted to emphasize L heel toe gait pattern however pt unable to maintain due to lethargy, 1 seated rest break   Stairs            Wheelchair Mobility    Modified Rankin (Stroke Patients Only)       Balance Overall balance assessment: Needs assistance Sitting-balance support: Feet supported Sitting balance-Leahy Scale: Fair Sitting balance - Comments: pt with posterior lean due to lethargy   Standing balance support: During functional activity Standing balance-Leahy Scale: Fair Standing balance comment: requires RW                            Cognition Arousal/Alertness: Lethargic Behavior During Therapy: WFL for tasks assessed/performed Overall Cognitive Status: Within Functional Limits for tasks assessed                                 General Comments: pt very sleepy but able to follow commands with repeated cues      Exercises Total Joint Exercises Ankle Circles/Pumps: AROM;Both;10 reps;Supine Quad Sets: AROM;Left;10 reps;Supine Heel Slides: Left;AAROM;10 reps;Supine    General Comments        Pertinent Vitals/Pain Pain Assessment: 0-10 Pain Score: 4  Pain Location: L knee Pain Descriptors / Indicators: Sore Pain Intervention(s): Monitored during session    Home Living                      Prior Function            PT Goals (  current goals can now be found in the care plan section) Progress towards PT goals: Progressing toward goals    Frequency    7X/week      PT Plan Current plan remains appropriate    Co-evaluation              AM-PAC PT "6 Clicks" Daily Activity  Outcome Measure  Difficulty turning over in bed (including adjusting bedclothes, sheets and blankets)?: A Little Difficulty moving from lying on back to sitting on the side of the bed? : A Little Difficulty sitting down on and standing up from a chair with arms (e.g., wheelchair, bedside  commode, etc,.)?: A Little Help needed moving to and from a bed to chair (including a wheelchair)?: A Little Help needed walking in hospital room?: A Little Help needed climbing 3-5 steps with a railing? : A Lot 6 Click Score: 17    End of Session Equipment Utilized During Treatment: Gait belt Activity Tolerance: Patient tolerated treatment well Patient left: in chair;with call bell/phone within reach;with family/visitor present Nurse Communication: Mobility status PT Visit Diagnosis: Pain;Difficulty in walking, not elsewhere classified (R26.2) Pain - Right/Left: Left Pain - part of body: Knee     Time: 7209-4709 PT Time Calculation (min) (ACUTE ONLY): 18 min  Charges:  $Gait Training: 8-22 mins                    G Codes:       Kittie Plater, PT, DPT Pager #: [phone] Office #: [phone]    [place] 10/24/2016, 9:43 AM

## 2016-10-24 NOTE — Evaluation (Signed)
Occupational Therapy Evaluation Patient Details Name: Toma Erichsen MRN: [id number] DOB: May 02, 1955 Today's Date: 10/24/2016    History of Present Illness Pt admitted for elective L TKA. Pt with no PMH or PSH on file.   Clinical Impression   PTA, pt was living with her husband and was independent in ADLs and IADLs. Currently, pt requires Min Guard A with  ADLs and functional mobility using RW. Provided education on LB ADLs and toilet transfer; pt demonstrated understanding. Pt very lethargic throughout session and had difficulty keeping eyes open; pt required increased VCs. Pt would benefit from continued acute OT to increase pt safety and independence with ADLs and fucntional mobility. Recommend dc home once medically stable per physician.     Follow Up Recommendations  DC plan and follow up therapy as arranged by surgeon;Supervision/Assistance - 24 hour    Equipment Recommendations  3 in 1 bedside commode    Recommendations for Other Services PT consult     Precautions / Restrictions Precautions Precautions: Knee Precaution Booklet Issued: No Precaution Comments: pt very lethargic and unable to retain info. Restrictions Weight Bearing Restrictions: Yes LLE Weight Bearing: Weight bearing as tolerated      Mobility Bed Mobility Overal bed mobility: Needs Assistance Bed Mobility: Sit to Supine     Supine to sit: Min guard Sit to supine: Min guard   General bed mobility comments: bed set up with simualte home environment with HOB and bed rails lowered  Transfers Overall transfer level: Needs assistance Equipment used: Rolling walker (2 wheeled) Transfers: Sit to/from Stand Sit to Stand: Min guard Stand pivot transfers: Min assist       General transfer comment: v/c's for safety and hand placement    Balance Overall balance assessment: Needs assistance Sitting-balance support: Feet supported Sitting balance-Leahy Scale: Fair Sitting balance - Comments:  Able to perform LB ADLs without LOB   Standing balance support: During functional activity Standing balance-Leahy Scale: Fair Standing balance comment: Able to perform LB ADLs without UE support                           ADL either performed or assessed with clinical judgement   ADL Overall ADL's : Needs assistance/impaired Eating/Feeding: Set up;Sitting   Grooming: Wash/dry hands;Min guard;Cueing for sequencing;Cueing for safety;Standing   Upper Body Bathing: Set up;Supervision/ safety;Sitting   Lower Body Bathing: Min guard   Upper Body Dressing : Set up;Supervision/safety;Sitting Upper Body Dressing Details (indicate cue type and reason): Pt donned night gown Lower Body Dressing: Min guard;Sit to/from stand;Cueing for sequencing Lower Body Dressing Details (indicate cue type and reason): Educated pt on LB dressing. Pt donned underwear with Min guard A for safety in standing Toilet Transfer: Min guard;Ambulation;RW;BSC   Toileting- Water quality scientist and Hygiene: Min guard;Sit to/from stand;Cueing for sequencing Toileting - Clothing Manipulation Details (indicate cue type and reason): Pt performed toilet hygiene after BM with Min guard for safety in standing   Tub/Shower Transfer Details (indicate cue type and reason): need education on shower transfer with 3N1 Functional mobility during ADLs: Min guard;Rolling walker General ADL Comments: Pt limited by lethargy due to medication. Pt performing LB ADLs with Min guard A for safety. Feel pt will progress well with time     Vision         Perception     Praxis      Pertinent Vitals/Pain Pain Assessment: Faces Pain Score: 8  Faces Pain Scale: Hurts little more  Pain Location: L knee Pain Descriptors / Indicators: Sore Pain Intervention(s): Monitored during session;Limited activity within patient's tolerance;Repositioned     Hand Dominance Right   Extremity/Trunk Assessment Upper Extremity  Assessment Upper Extremity Assessment: Overall WFL for tasks assessed   Lower Extremity Assessment Lower Extremity Assessment: Defer to PT evaluation LLE Deficits / Details: able to initate quad set   Cervical / Trunk Assessment Cervical / Trunk Assessment: Normal   Communication Communication Communication: No difficulties   Cognition Arousal/Alertness: Lethargic Behavior During Therapy: WFL for tasks assessed/performed Overall Cognitive Status: Within Functional Limits for tasks assessed                                 General Comments: pt very sleepy but able to follow commands with repeated cues   General Comments       Exercises    Shoulder Instructions      Home Living Family/patient expects to be discharged to:: Private residence Living Arrangements: Spouse/significant other Available Help at Discharge: Family;Available PRN/intermittently (spouse works at night) Type of Home: House Home Access: Stairs to enter [employer] of Steps: 6 Entrance Stairs-Rails: Can reach both Home Layout: One level     Bathroom Shower/Tub: Tub/shower unit;Walk-in shower (Uses walk-in shower)   Bathroom Toilet: Standard Bathroom Accessibility: Yes   Home Equipment: Walker - 2 wheels;Cane - single point;Bedside commode          Prior Functioning/Environment Level of Independence: Independent        Comments: ADLs and IADLs        OT Problem List: Decreased strength;Decreased range of motion;Decreased activity tolerance;Impaired balance (sitting and/or standing);Decreased safety awareness;Decreased knowledge of use of DME or AE;Decreased knowledge of precautions;Pain      OT Treatment/Interventions: Self-care/ADL training;Therapeutic exercise;Energy conservation;DME and/or AE instruction;Therapeutic activities;Patient/family education    OT Goals(Current goals can be found in the care plan section) Acute Rehab OT Goals Patient Stated Goal: return  home OT Goal Formulation: With patient Time For Goal Achievement: 11/07/16 Potential to Achieve Goals: Good ADL Goals Pt Will Perform Grooming: with set-up;with supervision;standing Pt Will Perform Lower Body Dressing: with set-up;with supervision;sit to/from stand Pt Will Transfer to Toilet: with set-up;with supervision;bedside commode;ambulating Pt Will Perform Toileting - Clothing Manipulation and hygiene: with set-up;with supervision;sit to/from stand Pt Will Perform Tub/Shower Transfer: Shower transfer;3 in 1;ambulating;with min guard assist;rolling walker  OT Frequency: Min 2X/week   Barriers to D/C:            Co-evaluation              AM-PAC PT "6 Clicks" Daily Activity     Outcome Measure Help from another person eating meals?: None Help from another person taking care of personal grooming?: A Little Help from another person toileting, which includes using toliet, bedpan, or urinal?: A Little Help from another person bathing (including washing, rinsing, drying)?: A Little Help from another person to put on and taking off regular upper body clothing?: None Help from another person to put on and taking off regular lower body clothing?: A Little 6 Click Score: 20   End of Session Equipment Utilized During Treatment: Gait belt;Rolling walker Nurse Communication: Mobility status  Activity Tolerance: Patient limited by lethargy;Patient limited by pain;Patient limited by fatigue Patient left: in chair;with call bell/phone within reach (with PT)  OT Visit Diagnosis: Unsteadiness on feet (R26.81);Other abnormalities of gait and mobility (R26.89);Muscle weakness (generalized) (M62.81);Pain Pain - Right/Left:  Left Pain - part of body: Knee                Time: 7998-0012 OT Time Calculation (min): 26 min Charges:  OT General Charges $OT Visit: 1 Procedure OT Evaluation $OT Eval Low Complexity: 1 Procedure OT Treatments $Self Care/Home Management : 8-22 mins G-Codes:      Scott Vanderveer MSOT, OTR/L Acute Rehab Pager: [phone] Office: [place] 10/24/2016, 5:00 PM

## 2016-10-24 NOTE — Progress Notes (Signed)
Called by RN for patient with increased lethargy.  MD has been paged and order given for narcan.  Received second call that SBP 80's, On my arrival to patients room, patient sitting in chair with lunch tray in front of her awake and communicating.  MD was updated on BP and order given for NS bolus.  No RRT interventions.  RN to call if assistance needed

## 2016-10-24 NOTE — Progress Notes (Signed)
Pt continues to be drowsy and lethargic despite receiving continuous fluids and no PRN pain medicine since last night. Called Dr. Lyla Glassing with an update, and received orders for 0.4 IV Narcan and a one-time 500 mL NS-bolus. Orders placed and pt updated with plan. Will continue to monitor

## 2016-10-24 NOTE — Discharge Summary (Signed)
Physician Discharge Summary  Patient ID: Diana Oneill MRN: [id number] DOB/AGE: 05-18-1955 61 y.o.  Admit date: 10/23/2016 Discharge date: 10/31/2016  Admission Diagnoses:  Osteoarthritis of left knee  Discharge Diagnoses:  Principal Problem:   Osteoarthritis of left knee Active Problems:   Hyponatremia   Acute kidney injury ([place])   Anemia   Thrombocytopenia ([facility])   Acute encephalopathy   Essential hypertension   Obesity   Acidosis   Nausea without vomiting   Tachycardia   COPD (chronic obstructive pulmonary disease) ([facility])   Acute respiratory failure ([facility])   Past Medical History:  Diagnosis Date  . Acute kidney injury ([place])   . Anemia   . Anxiety   . Arthritis   . Cancer ([place])    skin cancer, uterine cancer  . COPD (chronic obstructive pulmonary disease) ([place])   . Depression   . Dyspnea    with exertion  . History of kidney stones   . Hypertension   . Hyponatremia   . Osteoarthritis   . Sleep apnea    does not use CPAP "Makes too much Noise"  . Thrombocytopenia ([place])   . Thrombocytopenia ([place]) 09/2016    Surgeries: Procedure(s): COMPUTER ASSISTED TOTAL KNEE ARTHROPLASTY on 10/23/2016   Consultants (if any): Treatment Team:  Waldemar Dickens, MD  Discharged Condition: Improved  Hospital Course: Diana Oneill is an 61 y.o. female who was admitted 10/23/2016 with a diagnosis of Osteoarthritis of left knee and went to the operating room on 10/23/2016 and underwent the above named procedures.    She was given perioperative antibiotics:  Anti-infectives    Start     Dose/Rate Route Frequency Ordered Stop   10/23/16 1930  vancomycin (VANCOCIN) IVPB 1000 mg/200 mL premix     1,000 mg 200 mL/hr over 60 Minutes Intravenous Every 12 hours 10/23/16 1237 10/23/16 2039   10/23/16 0600  vancomycin (VANCOCIN) 1,500 mg in sodium chloride 0.9 % 500 mL IVPB     1,500 mg 250 mL/hr over 120 Minutes Intravenous To [facility] 10/20/16 1314 10/23/16  0918    .  She was given sequential compression devices, early ambulation, and ASA for DVT prophylaxis.  On POD#1 she was found to have ARF, which was treated with IV fluids. The hospitalist was consulted. The normalization of her creatinine took several days. She required 2 units of packed red blood cells for anemia secondary to acute blood loss and dilution from IV fluids.  She did have oversedation from her narcotic pain medications. Her pain was difficult to manage due to preoperative narcotics. She did require 2 doses of Narcan. She medically improved, and her pain was well-controlled on oral Dilaudid.  She benefited maximally from the hospital stay.    Recent vital signs:  Vitals:   10/30/16 1419 10/31/16 0522  BP: 128/61 121/66  Pulse: 64 70  Resp: 18 18  Temp: 97.6 F (36.4 C) 98.2 F (36.8 C)    Recent laboratory studies:  Lab Results  Component Value Date   HGB 10.2 (L) 10/29/2016   HGB 8.1 (L) 10/28/2016   HGB 8.3 (L) 10/26/2016   Lab Results  Component Value Date   WBC 4.2 10/26/2016   PLT 112 (L) 10/26/2016   No results found for: INR Lab Results  Component Value Date   NA 130 (L) 10/31/2016   K 4.1 10/31/2016   CL 94 (L) 10/31/2016   CO2 27 10/31/2016   BUN 13 10/31/2016   CREATININE 0.93 10/31/2016  GLUCOSE 83 10/31/2016    Discharge Medications:   Allergies as of 10/31/2016      Reactions   Penicillins Anaphylaxis   PATIENT HAD A PCN REACTION WITH IMMEDIATE RASH, FACIAL/TONGUE/THROAT SWELLING, SOB, OR LIGHTHEADEDNESS WITH HYPOTENSION:  #  #  #  YES  #  #  #   Has patient had a PCN reaction causing severe rash involving mucus membranes or skin necrosis: No Has patient had a PCN reaction that required hospitalization No Has patient had a PCN reaction occurring within the last 10 years: No If all of the above answers are "NO", then may proceed with Cephalosporin use.   Prozac [fluoxetine Hcl] Hives, Swelling   SWELLING REACTION UNSPECIFIED        Medication List    STOP taking these medications   ibuprofen 200 MG tablet Commonly known as:  ADVIL,MOTRIN   oxyCODONE-acetaminophen 10-325 MG tablet Commonly known as:  PERCOCET     TAKE these medications   albuterol 0.63 MG/3ML nebulizer solution Commonly known as:  ACCUNEB Take 1 ampule by nebulization every 6 (six) hours as needed for wheezing.   aspirin 81 MG chewable tablet Chew 1 tablet (81 mg total) by mouth 2 (two) times daily.   citalopram 20 MG tablet Commonly known as:  CELEXA Take 40 mg by mouth daily.   clonazePAM 0.5 MG tablet Commonly known as:  KLONOPIN Take 0.5 mg by mouth every 8 (eight) hours.   cyclobenzaprine 5 MG tablet Commonly known as:  FLEXERIL Take 5 mg by mouth at bedtime.   divalproex 500 MG DR tablet Commonly known as:  DEPAKOTE Take 500 mg by mouth 2 (two) times daily.   docusate sodium 100 MG capsule Commonly known as:  COLACE Take 1 capsule (100 mg total) by mouth 2 (two) times daily.   gabapentin 300 MG capsule Commonly known as:  NEURONTIN Take 300-600 mg by mouth every 8 (eight) hours.   HYDROmorphone 2 MG tablet Commonly known as:  DILAUDID Take 1 tablet (2 mg total) by mouth every 4 (four) hours as needed for severe pain.   LATUDA 120 MG Tabs Generic drug:  Lurasidone HCl Take 120 mg by mouth daily.   lisinopril 10 MG tablet Commonly known as:  PRINIVIL,ZESTRIL Take 10 mg by mouth daily.   ondansetron 4 MG tablet Commonly known as:  ZOFRAN Take 1 tablet (4 mg total) by mouth every 6 (six) hours as needed for nausea.   senna 8.6 MG Tabs tablet Commonly known as:  SENOKOT Take 2 tablets (17.2 mg total) by mouth at bedtime.       Diagnostic Studies: Ct Angio Chest Pe W Or Wo Contrast  Result Date: 10/28/2016 CLINICAL DATA:  Hypoxemia EXAM: CT ANGIOGRAPHY CHEST WITH CONTRAST TECHNIQUE: Multidetector CT imaging of the chest was performed using the standard protocol during bolus administration of intravenous  contrast. Multiplanar CT image reconstructions and MIPs were obtained to evaluate the vascular anatomy. CONTRAST:  100 cc Isovue 370 IV COMPARISON:  10/25/2016 FINDINGS: Cardiovascular: Heart is mildly enlarged. Slight dilatation of the ascending thoracic aorta, 4.1 cm. No filling defects in the pulmonary arteries to suggest pulmonary emboli. Mediastinum/Nodes: No mediastinal, hilar, or axillary adenopathy. Lungs/Pleura: Small bilateral pleural effusions. Patchy ground-glass opacities noted anteriorly in the lingula and right upper lobe. No confluent airspace opacities. Upper Abdomen: Imaging into the upper abdomen shows no acute findings. Musculoskeletal: Chest wall soft tissues are unremarkable. No acute bony abnormality. Review of the MIP images confirms the above findings.  IMPRESSION: No evidence of pulmonary embolus. Trace bilateral pleural effusions. Scattered ground-glass opacities anteriorly in the right upper lobe and lingula could reflect areas of alveolitis/ small airways disease. 4.1 cm ascending thoracic aortic aneurysm. Recommend annual imaging followup by CTA or MRA. This recommendation follows 2010 ACCF/AHA/AATS/ACR/ASA/SCA/SCAI/SIR/STS/SVM Guidelines for the Diagnosis and Management of Patients with Thoracic Aortic Disease. Circulation. 2010; 121: U440-H474 Electronically Signed   By: Rolm Baptise M.D.   On: 10/28/2016 17:37   Dg Chest Port 1 View  Result Date: 10/25/2016 CLINICAL DATA:  61 year old female with hypoxia. Medical history includes hypertension, COPD and dyspnea EXAM: PORTABLE CHEST 1 VIEW COMPARISON:  Prior chest x-ray 10/25/2016 FINDINGS: Stable borderline cardiomegaly. Mild chronic vascular congestion without overt edema, no interval change compared to prior. No focal airspace consolidation, pleural effusion, pneumothorax or atelectasis. Osseous structures are intact and unremarkable for age. IMPRESSION: Stable chest x-ray without acute cardiopulmonary process. Stable mild  cardiomegaly and low inspiratory volumes. Electronically Signed   By: Jacqulynn Cadet M.D.   On: 10/25/2016 19:00   Dg Chest Port 1 View  Result Date: 10/25/2016 CLINICAL DATA:  Cough. EXAM: PORTABLE CHEST 1 VIEW COMPARISON:  12/22/2015 . FINDINGS: Mild cardiomegaly. No pulmonary venous congestion. Low lung volumes. No focal alveolar infiltrate. No pleural effusion or pneumothorax. IMPRESSION: 1. Cardiomegaly.  No pulmonary venous congestion. 2. Low lung volumes . Electronically Signed   By: Marcello Moores  Register   On: 10/25/2016 10:39   Dg Knee Left Port  Result Date: 10/23/2016 CLINICAL DATA:  Status post left knee replacement EXAM: PORTABLE LEFT KNEE - 1-2 VIEW COMPARISON:  None. FINDINGS: Left knee replacement is noted in satisfactory position. Air is noted in the surgical bed. No acute bony abnormality is seen. IMPRESSION: Status post left knee replacement without acute abnormality. Electronically Signed   By: Inez Catalina M.D.   On: 10/23/2016 11:42    Disposition:   Discharge Instructions    Call MD / Call 911    Complete by:  As directed    If you experience chest pain or shortness of breath, CALL 911 and be transported to the hospital emergency room.  If you develope a fever above 101 F, pus (white drainage) or increased drainage or redness at the wound, or calf pain, call your surgeon's office.   Constipation Prevention    Complete by:  As directed    Drink plenty of fluids.  Prune juice may be helpful.  You may use a stool softener, such as Colace (over the counter) 100 mg twice a day.  Use MiraLax (over the counter) for constipation as needed.   Diet - low sodium heart healthy    Complete by:  As directed    Do not put a pillow under the knee. Place it under the heel.    Complete by:  As directed    Driving restrictions    Complete by:  As directed    No driving for 6 weeks   Increase activity slowly as tolerated    Complete by:  As directed    Lifting restrictions    Complete  by:  As directed    No lifting for 6 weeks   TED hose    Complete by:  As directed    Use stockings (TED hose) for 2 weeks on both leg(s).  You may remove them at night for sleeping.      Follow-up Information    Sasan Wilkie, Aaron Edelman, MD Follow up in 2 week(s).   Specialty:  Orthopedic Surgery Why:  For wound re-check Contact information: [address] [address] [phone]            Signed: Elie Goody 10/31/2016, 7:51 AM

## 2016-10-24 NOTE — Progress Notes (Signed)
Physical Therapy Treatment Patient Details Name: Diana Oneill MRN: [id number] DOB: 22-Sep-1955 Today's Date: 10/24/2016    History of Present Illness Pt admitted for elective L TKA. Pt with no PMH or PSH on file.    PT Comments    Pt continues to be limited by lethargy. Performed seated HEP with eyes closed. Was visibly fatigued while ambulating in hall way and had difficulty maintaining upright posture. Stairs not attempted at this time. Will try steps tomorrow if pts activity tolerance levels allows.   Follow Up Recommendations  Home health PT;Supervision/Assistance - 24 hour     Equipment Recommendations  3in1 (PT)    Recommendations for Other Services       Precautions / Restrictions Precautions Precautions: Knee Precaution Booklet Issued: No Precaution Comments: pt very lethargic and unable to retain info. Restrictions Weight Bearing Restrictions: Yes LLE Weight Bearing: Weight bearing as tolerated    Mobility  Bed Mobility Overal bed mobility: Needs Assistance Bed Mobility: Sit to Supine     Supine to sit: Min guard Sit to supine: Min guard   General bed mobility comments: vc for technique, no physical assist needed.  Transfers Overall transfer level: Needs assistance Equipment used: Rolling walker (2 wheeled) Transfers: Sit to/from Stand Sit to Stand: Min guard Stand pivot transfers: Min assist       General transfer comment: v/c's for safety and hand placement  Ambulation/Gait Ambulation/Gait assistance: Min guard Ambulation Distance (Feet): 120 Feet Assistive device: Rolling walker (2 wheeled) Gait Pattern/deviations: Step-through pattern;Decreased stride length     General Gait Details: pt amb with eyes 1/2 way closed but demo'd no knee buckling. attempted to emphasize postural control however pt unable to maintain due to lethargy.   Stairs            Wheelchair Mobility    Modified Rankin (Stroke Patients Only)        Balance Overall balance assessment: Needs assistance Sitting-balance support: Feet supported Sitting balance-Leahy Scale: Fair Sitting balance - Comments: pt with posterior lean due to lethargy   Standing balance support: During functional activity Standing balance-Leahy Scale: Fair Standing balance comment: requires RW                            Cognition Arousal/Alertness: Lethargic Behavior During Therapy: WFL for tasks assessed/performed Overall Cognitive Status: Within Functional Limits for tasks assessed                                 General Comments: pt very sleepy but able to follow commands with repeated cues      Exercises Total Joint Exercises Short Arc Quad: AROM;Left;10 reps;Seated Heel Slides: AROM;Left;10 reps;Seated (towel under foot) Hip ABduction/ADduction: AROM;Left;10 reps;Seated Long Arc Quad: AROM;Left;10 reps;Seated Knee Flexion: AROM;Left;5 reps;Seated (10 sec holds)    General Comments        Pertinent Vitals/Pain Pain Assessment: 0-10 Pain Score: 8  Pain Location: L knee Pain Descriptors / Indicators: Sore Pain Intervention(s): Monitored during session;Limited activity within patient's tolerance;Repositioned    Home Living Family/patient expects to be discharged to:: Private residence Living Arrangements: Spouse/significant other Available Help at Discharge: Family;Available PRN/intermittently (spouse works at night) Type of Home: House Home Access: Stairs to enter Entrance Stairs-Rails: Can reach both Home Layout: One level Home Equipment: Environmental consultant - 2 wheels;Cane - single point;Bedside commode      Prior Function Level of Independence:  Independent      Comments: ADLs and IADLs   PT Goals (current goals can now be found in the care plan section) Acute Rehab PT Goals Patient Stated Goal: return home PT Goal Formulation: With patient Time For Goal Achievement: 10/30/16 Potential to Achieve Goals:  Good Progress towards PT goals: Progressing toward goals    Frequency    7X/week      PT Plan Current plan remains appropriate    Co-evaluation              AM-PAC PT "6 Clicks" Daily Activity  Outcome Measure  Difficulty turning over in bed (including adjusting bedclothes, sheets and blankets)?: A Little Difficulty moving from lying on back to sitting on the side of the bed? : A Little Difficulty sitting down on and standing up from a chair with arms (e.g., wheelchair, bedside commode, etc,.)?: A Little Help needed moving to and from a bed to chair (including a wheelchair)?: A Little Help needed walking in hospital room?: A Little Help needed climbing 3-5 steps with a railing? : A Lot 6 Click Score: 17    End of Session Equipment Utilized During Treatment: Gait belt Activity Tolerance: Patient limited by lethargy Patient left: with call bell/phone within reach;in bed Nurse Communication: Mobility status PT Visit Diagnosis: Pain;Difficulty in walking, not elsewhere classified (R26.2) Pain - Right/Left: Left Pain - part of body: Knee     Time: 1420-1449 PT Time Calculation (min) (ACUTE ONLY): 29 min  Charges:  $Gait Training: 8-22 mins $Therapeutic Exercise: 8-22 mins                    G Codes:       Benjiman Core, [place] Pager [phone] Acute Rehab   Allena Katz 10/24/2016, 3:04 PM

## 2016-10-25 ENCOUNTER — Inpatient Hospital Stay (HOSPITAL_COMMUNITY): Payer: Medicare Other

## 2016-10-25 ENCOUNTER — Encounter (HOSPITAL_COMMUNITY): Payer: Self-pay | Admitting: Internal Medicine

## 2016-10-25 DIAGNOSIS — I10 Essential (primary) hypertension: Secondary | ICD-10-CM | POA: Diagnosis present

## 2016-10-25 DIAGNOSIS — E669 Obesity, unspecified: Secondary | ICD-10-CM

## 2016-10-25 DIAGNOSIS — G9340 Encephalopathy, unspecified: Secondary | ICD-10-CM

## 2016-10-25 DIAGNOSIS — E872 Acidosis, unspecified: Secondary | ICD-10-CM

## 2016-10-25 DIAGNOSIS — M1712 Unilateral primary osteoarthritis, left knee: Principal | ICD-10-CM

## 2016-10-25 DIAGNOSIS — D638 Anemia in other chronic diseases classified elsewhere: Secondary | ICD-10-CM

## 2016-10-25 DIAGNOSIS — J449 Chronic obstructive pulmonary disease, unspecified: Secondary | ICD-10-CM | POA: Diagnosis present

## 2016-10-25 DIAGNOSIS — R000 Tachycardia, unspecified: Secondary | ICD-10-CM

## 2016-10-25 DIAGNOSIS — J9600 Acute respiratory failure, unspecified whether with hypoxia or hypercapnia: Secondary | ICD-10-CM | POA: Diagnosis not present

## 2016-10-25 DIAGNOSIS — J9601 Acute respiratory failure with hypoxia: Secondary | ICD-10-CM

## 2016-10-25 DIAGNOSIS — E871 Hypo-osmolality and hyponatremia: Secondary | ICD-10-CM | POA: Diagnosis present

## 2016-10-25 DIAGNOSIS — R110 Nausea: Secondary | ICD-10-CM

## 2016-10-25 DIAGNOSIS — D696 Thrombocytopenia, unspecified: Secondary | ICD-10-CM

## 2016-10-25 DIAGNOSIS — N179 Acute kidney failure, unspecified: Secondary | ICD-10-CM | POA: Diagnosis present

## 2016-10-25 DIAGNOSIS — D649 Anemia, unspecified: Secondary | ICD-10-CM | POA: Diagnosis present

## 2016-10-25 HISTORY — DX: Obesity, unspecified: E66.9

## 2016-10-25 HISTORY — DX: Nausea: R11.0

## 2016-10-25 HISTORY — DX: Tachycardia, unspecified: R00.0

## 2016-10-25 HISTORY — DX: Encephalopathy, unspecified: G93.40

## 2016-10-25 HISTORY — DX: Acidosis: E87.2

## 2016-10-25 HISTORY — DX: Acidosis, unspecified: E87.20

## 2016-10-25 HISTORY — DX: Acute respiratory failure, unspecified whether with hypoxia or hypercapnia: J96.00

## 2016-10-25 HISTORY — DX: Essential (primary) hypertension: I10

## 2016-10-25 LAB — IRON AND TIBC
IRON: 22 ug/dL — AB (ref 28–170)
SATURATION RATIOS: 6 % — AB (ref 10.4–31.8)
TIBC: 343 ug/dL (ref 250–450)
UIBC: 321 ug/dL

## 2016-10-25 LAB — BASIC METABOLIC PANEL
Anion gap: 8 (ref 5–15)
Anion gap: 7 (ref 5–15)
BUN: 35 mg/dL — AB (ref 6–20)
BUN: 36 mg/dL — AB (ref 6–20)
CALCIUM: 7.9 mg/dL — AB (ref 8.9–10.3)
CHLORIDE: 105 mmol/L (ref 101–111)
CO2: 17 mmol/L — AB (ref 22–32)
CO2: 17 mmol/L — ABNORMAL LOW (ref 22–32)
CREATININE: 2.23 mg/dL — AB (ref 0.44–1.00)
Calcium: 7.7 mg/dL — ABNORMAL LOW (ref 8.9–10.3)
Chloride: 105 mmol/L (ref 101–111)
Creatinine, Ser: 2.01 mg/dL — ABNORMAL HIGH (ref 0.44–1.00)
GFR calc Af Amer: 30 mL/min — ABNORMAL LOW (ref >60)
GFR calc Af Amer: 26 mL/min — ABNORMAL LOW (ref >60)
GFR calc non Af Amer: 26 mL/min — ABNORMAL LOW (ref >60)
GFR, EST NON AFRICAN AMERICAN: 23 mL/min — AB (ref >60)
GLUCOSE: 94 mg/dL (ref 65–99)
Glucose, Bld: 106 mg/dL — ABNORMAL HIGH (ref 65–99)
Potassium: 5 mmol/L (ref 3.5–5.1)
Potassium: 5.2 mmol/L — ABNORMAL HIGH (ref 3.5–5.1)
SODIUM: 129 mmol/L — AB (ref 135–145)
Sodium: 130 mmol/L — ABNORMAL LOW (ref 135–145)

## 2016-10-25 LAB — CBC
HEMATOCRIT: 27 % — AB (ref 36.0–46.0)
HEMOGLOBIN: 8.9 g/dL — AB (ref 12.0–15.0)
MCH: 28.4 pg (ref 26.0–34.0)
MCHC: 33 g/dL (ref 30.0–36.0)
MCV: 86.3 fL (ref 78.0–100.0)
Platelets: 114 10*3/uL — ABNORMAL LOW (ref 150–400)
RBC: 3.13 MIL/uL — ABNORMAL LOW (ref 3.87–5.11)
RDW: 15.3 % (ref 11.5–15.5)
WBC: 5.8 10*3/uL (ref 4.0–10.5)

## 2016-10-25 LAB — BLOOD GAS, ARTERIAL
ACID-BASE DEFICIT: 12.6 mmol/L — AB (ref 0.0–2.0)
ACID-BASE DEFICIT: 7.4 mmol/L — AB (ref 0.0–2.0)
BICARBONATE: 14.3 mmol/L — AB (ref 20.0–28.0)
Bicarbonate: 18.7 mmol/L — ABNORMAL LOW (ref 20.0–28.0)
DELIVERY SYSTEMS: POSITIVE
DRAWN BY: 42624
Drawn by: 39899
Expiratory PAP: 5
FIO2: 21
FIO2: 0.4
Inspiratory PAP: 10
O2 SAT: 93.8 %
O2 Saturation: 99 %
PCO2 ART: 40.8 mmHg (ref 32.0–48.0)
PCO2 ART: 45.6 mmHg (ref 32.0–48.0)
PH ART: 7.171 — AB (ref 7.350–7.450)
PO2 ART: 80.9 mmHg — AB (ref 83.0–108.0)
Patient temperature: 98.9
Patient temperature: 98.6
pH, Arterial: 7.236 — ABNORMAL LOW (ref 7.350–7.450)
pO2, Arterial: 156 mmHg — ABNORMAL HIGH (ref 83.0–108.0)

## 2016-10-25 LAB — RETICULOCYTES
RBC.: 3.41 MIL/uL — AB (ref 3.87–5.11)
RETIC COUNT ABSOLUTE: 68.2 10*3/uL (ref 19.0–186.0)
Retic Ct Pct: 2 % (ref 0.4–3.1)

## 2016-10-25 LAB — OSMOLALITY: Osmolality: 283 mOsm/kg (ref 275–295)

## 2016-10-25 LAB — TSH: TSH: 2.001 u[IU]/mL (ref 0.350–4.500)

## 2016-10-25 LAB — FOLATE: Folate: 13.4 ng/mL (ref >5.9)

## 2016-10-25 LAB — LACTIC ACID, PLASMA: LACTIC ACID, VENOUS: 2 mmol/L — AB (ref 0.5–1.9)

## 2016-10-25 LAB — VITAMIN B12: Vitamin B-12: 634 pg/mL (ref 180–914)

## 2016-10-25 LAB — CORTISOL: Cortisol, Plasma: 6.3 ug/dL

## 2016-10-25 LAB — FERRITIN: FERRITIN: 28 ng/mL (ref 11–307)

## 2016-10-25 MED ORDER — SODIUM CHLORIDE 0.9 % IV BOLUS (SEPSIS)
500.0000 mL | Freq: Once | INTRAVENOUS | Status: AC
  Administered 2016-10-25: 500 mL via INTRAVENOUS

## 2016-10-25 MED ORDER — SODIUM BICARBONATE 8.4 % IV SOLN
INTRAVENOUS | Status: AC
  Filled 2016-10-25: qty 50

## 2016-10-25 MED ORDER — OXYCODONE-ACETAMINOPHEN 7.5-325 MG PO TABS
1.0000 | ORAL_TABLET | Freq: Four times a day (QID) | ORAL | Status: DC | PRN
  Administered 2016-10-26 – 2016-10-27 (×5): 1 via ORAL
  Filled 2016-10-25 (×5): qty 1

## 2016-10-25 MED ORDER — DIPHENHYDRAMINE HCL 12.5 MG/5ML PO ELIX: 6.2500 mg | ORAL_SOLUTION | Freq: Three times a day (TID) | ORAL | Status: DC | PRN

## 2016-10-25 MED ORDER — ALBUTEROL SULFATE (2.5 MG/3ML) 0.083% IN NEBU: 2.5000 mg | INHALATION_SOLUTION | RESPIRATORY_TRACT | Status: DC

## 2016-10-25 MED ORDER — CLONAZEPAM 0.5 MG PO TABS
0.5000 mg | ORAL_TABLET | Freq: Two times a day (BID) | ORAL | Status: DC | PRN
  Administered 2016-10-27 – 2016-10-30 (×5): 0.5 mg via ORAL
  Filled 2016-10-25 (×5): qty 1

## 2016-10-25 MED ORDER — HYDROMORPHONE HCL 2 MG PO TABS: 1.0000 mg | ORAL_TABLET | Freq: Four times a day (QID) | ORAL | Status: DC | PRN

## 2016-10-25 MED ORDER — GABAPENTIN 100 MG PO CAPS
200.0000 mg | ORAL_CAPSULE | Freq: Two times a day (BID) | ORAL | Status: DC
  Administered 2016-10-25 – 2016-10-31 (×14): 200 mg via ORAL
  Filled 2016-10-25 (×16): qty 2

## 2016-10-25 MED ORDER — ONDANSETRON HCL 4 MG/2ML IJ SOLN
4.0000 mg | INTRAMUSCULAR | Status: AC
  Administered 2016-10-25 (×3): 4 mg via INTRAVENOUS
  Filled 2016-10-25 (×3): qty 2

## 2016-10-25 MED ORDER — SODIUM BICARBONATE 8.4 % IV SOLN
100.0000 meq | Freq: Once | INTRAVENOUS | Status: AC
  Administered 2016-10-25: 100 meq via INTRAVENOUS
  Filled 2016-10-25: qty 50

## 2016-10-25 MED ORDER — STERILE WATER FOR INJECTION IV SOLN
INTRAVENOUS | Status: DC
  Administered 2016-10-26 – 2016-10-29 (×3): via INTRAVENOUS
  Filled 2016-10-25 (×16): qty 850

## 2016-10-25 MED ORDER — GABAPENTIN 100 MG PO CAPS: 200.0000 mg | ORAL_CAPSULE | Freq: Three times a day (TID) | ORAL | Status: DC

## 2016-10-25 NOTE — Progress Notes (Signed)
Progress note  5:43pm  1. Acute respiratory failure with hypoxia. ABG with PH 7.1, PO2 80.9, PCO2 40 bicarb 14. Hx copd, sleep apnea, long term narcotic use and acute kidney injury this hospitalization s/p left knee replacement -transfer to stepdown -BiPap per respiratory -2 amps bicarb -repeat ABG in 2 hours -if PH is less than 7.25 recommend calling critical care for consideration of intubation -critical care has been made aware.    Dyanne Carrel NP

## 2016-10-25 NOTE — Progress Notes (Signed)
Patient's BP 105/79

## 2016-10-25 NOTE — Progress Notes (Addendum)
   Subjective:  Patient reports pain as mild to moderate.  Denies N/V/CP/SOB. Received narcan yesterday with great response. More alert today. Reports BM yesterday.  Objective:   VITALS:   Vitals:   10/24/16 1307 10/24/16 1704 10/24/16 2045 10/25/16 0408  BP: (!) 88/44 117/66 (!) 106/50 (!) 110/57  Pulse: 80 72 85 61  Resp:   18 16  Temp:   97.5 F (36.4 C) 97.8 F (36.6 C)  TempSrc:   Oral Oral  SpO2: 97% 96% 92% 100%  Weight:        NAD ABD soft Sensation intact distally Intact pulses distally Dorsiflexion/Plantar flexion intact Incision: dressing C/D/I Compartment soft   Lab Results  Component Value Date   WBC 5.8 10/25/2016   HGB 8.9 (L) 10/25/2016   HCT 27.0 (L) 10/25/2016   MCV 86.3 10/25/2016   PLT 114 (L) 10/25/2016   BMET    Component Value Date/Time   NA 130 (L) 10/25/2016 0507   K 5.0 10/25/2016 0507   CL 105 10/25/2016 0507   CO2 17 (L) 10/25/2016 0507   GLUCOSE 106 (H) 10/25/2016 0507   BUN 35 (H) 10/25/2016 0507   CREATININE 2.01 (H) 10/25/2016 0507   CALCIUM 7.7 (L) 10/25/2016 0507   GFRNONAA 26 (L) 10/25/2016 0507   GFRAA 30 (L) 10/25/2016 0507     Assessment/Plan: 2 Days Post-Op   Principal Problem:   Osteoarthritis of left knee   WBAT with walker DVT ppx: ASA, SCDs, TEDs PO pain control: PO pain control ARF: Cr improving, continue IVFs, encourage PO intake, recheck in am ABLA: asymptomatic, monitor PT/OT Dispo: Will call hospitalist consult for hyponatremia and ARF, will d/c home with outpatient PT when medically ready, Thursday vs Friday   Sreya Froio, Horald Pollen 10/25/2016, 7:51 AM   Rod Can, MD Cell [phone]

## 2016-10-25 NOTE — Progress Notes (Signed)
Physical Therapy Treatment Patient Details Name: Diana Oneill MRN: [id number] DOB: 1955/12/12 Today's Date: 10/25/2016    History of Present Illness Pt admitted for elective L TKA. Pt with no PMH or PSH on file.  Dealing now with postoperative hypotension with AKI, encephalopathy with solemance and hyponatremia.    PT Comments    Patient continued to decline this session from AM session with increased assist for transfers, increasing confusion, poor activity tolerance with low BP (97/53) and feel she can not at this point be cared for safely at home.  Recommend SNF rehab prior to d/c home.    Follow Up Recommendations  SNF;Supervision/Assistance - 24 hour     Equipment Recommendations  3in1 (PT)    Recommendations for Other Services       Precautions / Restrictions Precautions Precautions: Knee Restrictions LLE Weight Bearing: Weight bearing as tolerated    Mobility  Bed Mobility Overal bed mobility: Needs Assistance         Sit to supine: Mod assist   General bed mobility comments: assist for both legs, increased time and cues to scoot to [facility] and assist to reposition shoulders/hips  Transfers Overall transfer level: Needs assistance Equipment used: Rolling walker (2 wheeled) Transfers: Sit to/from Stand Sit to Stand: Max assist         General transfer comment: increased time, lifting assist. lowering assist as sits with uncontrolled descent  Ambulation/Gait Ambulation/Gait assistance: Mod assist Ambulation Distance (Feet): 45 Feet (&20') Assistive device: Rolling walker (2 wheeled) Gait Pattern/deviations: Shuffle;Trunk flexed;Decreased stride length;Step-to pattern     General Gait Details: raced to goal in hallway and sat in desk chair, BP 97/53.  Stood with assist and walked short distance, pt reported about to fall with knees buckling so sat in desk chair again, then brought recliner and pt wheeled to room and stand pivot to bed; RN  aware   Stairs            Wheelchair Mobility    Modified Rankin (Stroke Patients Only)       Balance Overall balance assessment: Needs assistance         Standing balance support: Bilateral upper extremity supported Standing balance-Leahy Scale: Poor                              Cognition Arousal/Alertness: Lethargic Behavior During Therapy: WFL for tasks assessed/performed Overall Cognitive Status: Impaired/Different from baseline Area of Impairment: Orientation                 Orientation Level: Disoriented to;Situation             General Comments: reported she drove to get pizza last night despite reminder she was here and could not have left and come back, continued to report she left; then removing covers to get up and stated had to "take that little boy home" educated no one in room and not to get up      Exercises Total Joint Exercises Ankle Circles/Pumps: AROM;Both;10 reps;Supine Quad Sets: AAROM;Both;5 reps;Supine (with max cues and assist due to lethargy ) Short Arc Quad: AROM;Left;10 reps;Supine Hip ABduction/ADduction: AAROM;Left;10 reps;Supine Knee Flexion: AAROM;10 reps;Supine Goniometric ROM: approx 10 extension and 60 flexion    General Comments        Pertinent Vitals/Pain Pain Score: 8  Pain Location: L knee Pain Descriptors / Indicators: Sore Pain Intervention(s): Monitored during session;Repositioned;Ice applied    Home Living  Prior Function            PT Goals (current goals can now be found in the care plan section) Progress towards PT goals: Not progressing toward goals - comment (weakness, hypotension, confusion)    Frequency    7X/week      PT Plan Discharge plan needs to be updated    Co-evaluation              AM-PAC PT "6 Clicks" Daily Activity  Outcome Measure  Difficulty turning over in bed (including adjusting bedclothes, sheets and blankets)?:  Total Difficulty moving from lying on back to sitting on the side of the bed? : Total Difficulty sitting down on and standing up from a chair with arms (e.g., wheelchair, bedside commode, etc,.)?: Total Help needed moving to and from a bed to chair (including a wheelchair)?: A Little Help needed walking in hospital room?: A Little Help needed climbing 3-5 steps with a railing? : Total 6 Click Score: 10    End of Session Equipment Utilized During Treatment: Gait belt Activity Tolerance: Treatment limited secondary to medical complications (Comment) (hypotension, confusion) Patient left: in bed;with call bell/phone within reach;with bed alarm set Nurse Communication: Mobility status;Other (comment) (AMS) PT Visit Diagnosis: Pain;Difficulty in walking, not elsewhere classified (R26.2);Other symptoms and signs involving the nervous system (R29.898) Pain - Right/Left: Left     Time: 8088-1103 PT Time Calculation (min) (ACUTE ONLY): 37 min  Charges:  $Gait Training: 8-22 mins $Therapeutic Exercise: 8-22 mins                    G CodesMagda Kiel, [place] [phone] 10/25/2016    Reginia Naas 10/25/2016, 5:32 PM

## 2016-10-25 NOTE — Progress Notes (Signed)
This note also relates to the following rows which could not be included: Pulse Rate - Cannot attach notes to unvalidated device data Resp - Cannot attach notes to unvalidated device data SpO2 - Cannot attach notes to unvalidated device data  Pt placed on BiPAP. Pt is tolerating well and resting.

## 2016-10-25 NOTE — Progress Notes (Signed)
OT Cancellation   10/25/16 1500  OT Visit Information  Last OT Received On 10/25/16  Reason Eval/Treat Not Completed Fatigue/lethargy limiting ability to participate. Pt unable to keep eyes open. Discussed with RN and she agreed pt would benefit to wait for OT session due to lethargy. Will return as schedule allows.   [place], OTR/L Acute Rehab Pager: [phone] Office: [phone]

## 2016-10-25 NOTE — Progress Notes (Signed)
Received patient from [facility], assessment completed, VS documented, oriented patient to the room.  CHG bath completed, skin assessment completed, will continue to monitor.

## 2016-10-25 NOTE — Consult Note (Signed)
[facility] Medical Consultation  Celene Pippins TDV:[id number] DOB: [birth year] DOA: 10/23/2016 PCP: Celedonio Miyamoto (Inactive)   Requesting physician: [place] Date of consultation: 10/25/16 Reason for consultation: hyponatremia, acute kidney injury  Impression/Recommendations Principal Problem:   Acute kidney injury [facility]) Active Problems:   Osteoarthritis of left knee   Hyponatremia   Anemia   Thrombocytopenia (HCC)   Acute encephalopathy   Essential hypertension   Obesity   Acidosis   Nausea without vomiting   Tachycardia   COPD (chronic obstructive pulmonary disease) ([place])  #1. Acute kidney injury. Likely related to hypotension in the setting of ACE inhibitor and I would be surprised if she had a history of some chronic kidney disease. Systolic blood pressure fell to 88 yesterday. She is provided with fluid bolus and nephrotoxins eliminated. Preoperative creatinine 1.18. Yesterday creatinine 2.40. This morning creatinine 2.01. -Continue normal saline at 100 per hour -Continue to hold nephrotoxins -Monitor urine output -If no improvement consider renal ultrasound -Minimize sedating medications -Recheck this afternoon  #2. Hyponatremia. Likely related to above. Sodium level 131. Preoperative sodium level 131. Patient appears somewhat dry. Does have some LE edema but suspect this is baseline.  -Obtain urine sodium and urine osmolality -Obtain serum osmolality -Obtain a chest x-ray -IV fluids as noted above -Recheck this afternoon  #3. Acute encephalopathy. Likely related to medications. Patient with a history of narcotic use and chronic pain preoperatively. She received Narcan 2 yesterday. Right lethargic on exam today. No focal deficits. Pain medications decreased yesterday. Home medications include Klonopin, Flexeril, Neurontin, latuda, oxycodone. Currently on Dilaudid by mouth -We will discontinue Dilaudid -We'll provide oxycodone at a lower dose than her home  dosage -Decrease Benadryl dose -Decrease Klonopin dose -Decrease Neurontin dose -obtain B-12, folate -Once she is quite alert will revisit and adjust medications as needed  #4. Hypertension. Medications include lisinopril. Blood pressures been soft postoperatively. Systolic blood pressure dipped 88 yesterday. Provided with IV fluids and has improved but remains a low end of normal. -Hold her home lisinopril -Continue IV fluids -Monitor every 4 hours 3 -Minimize sedating drugs  #5. Acidosis. Likely related to #1. -See #1 -Monitor closely  #6. Anemia. Likely related to acute blood loss and perhaps some dilution. Hemoglobin this morning 8.9. Preop hemoglobin 12.4. No signs symptoms of active bleeding -Obtain an anemia panel -Monitor  #7. Thrombocytopenia. Platelet count today 114. Preoperatively 139. Likely reactive. See #6. -Monitor  #8. Osteoarthritis of knee. Status post total knee replacement 2 days ago. -pain management per ortho  #9. Obesity. Weight 247 pounds. Patient is status post gastric bypass surgery 2009 -nutritional consult  #10. Copd. Quit smoking 2 months ago. Appears stable at baseline. Oxygen saturation level greater than 90% on room air.  -scheduled nebs x 3 doses -monitor oxygen saturation level -oxygen supplementation as needed  #11. Tachycardia. Likely related to above. Hr range 61-115 per chart. preop ekg with SR. No chest pain. Pre-op note indicates she was evaluated by cardiology in  2012 and tests normal. -fluids as noted above -tsh -monitor closely   [facility] will followup again tomorrow. Please contact if can be of assistance in the meanwhile. Thank you for this consultation.  Chief Complaint: hyponatremia/acute kidney injury  HPI:  Ms baise is a 61 year old female with a past medical history significant for obesity status post gastric bypass 2009, hypertension, COPD, anxiety, sleep apnea underwent left total knee replacement 2 days ago  secondary to osteoarthritis. Morning [facility] called to consult for hyponatremia and acute kidney  injury.  Formation is obtained from the chart and the patient noting that information from patient may be unreliable he appears to be quite lethargic. She underwent total knee replacement 2 days ago. Yesterday she was found to have acute renal failure was provided with IV fluids and nephrotoxins were eliminated. In addition she was hypotensive with a systolic blood pressure of 88. She also received Narcan 2 for oversedation. Of note patient has a history of chronic pain and preoperative long-term narcotic use. Today blood pressure improved but little improvement in creatinine and sodium level noted to be 130.  Patient reports adequate pain control of the left knee. She also reports some nausea without vomiting "only nauseated after I eat". She reports "feeling tired". She denies headache chest pain palpitation shortness of breath. He denies abdominal pain dysuria hematuria frequency or urgency. She reports a history of intermittent lower extremity edema and notes that her urine output is usually down during these episodes. She states she's gone to urgent care in the past and a "gave me some medicine for the swelling". He states she "used to weigh 400 pounds and had gastric bypass surgery 2009". She also reports history of COPD and sleep apnea but does not use cpap.  Review of Systems:  10 point review of systems complete and all systems are negative except as indicated in the history of present illness  Past Medical History:  Diagnosis Date  . Acute kidney injury ([place])   . Anemia   . Anxiety   . Arthritis   . Cancer ([place])    skin cancer, uterine cancer  . COPD (chronic obstructive pulmonary disease) ([place])   . Depression   . Dyspnea    with exertion  . History of kidney stones   . Hypertension   . Hyponatremia   . Osteoarthritis   . Sleep apnea    does not use CPAP "Makes too much  Noise"  . Thrombocytopenia ([place])    Past Surgical History:  Procedure Laterality Date  . ABDOMINAL HYSTERECTOMY    . ACHILLES TENDON REPAIR Left   . GASTRIC BYPASS    . JOINT REPLACEMENT Right    knee replace  . KNEE ARTHROPLASTY Left 10/23/2016   Procedure: COMPUTER ASSISTED TOTAL KNEE ARTHROPLASTY;  Surgeon: Rod Can, MD;  Location: [facility];  Service: Orthopedics;  Laterality: Left;   Social History:  reports that she quit smoking about 2 months ago. Her smoking use included Cigarettes. She has a 40.00 pack-year smoking history. She has never used smokeless tobacco. She reports that she uses drugs. She reports that she does not drink alcohol.  Allergies  Allergen Reactions  . Penicillins Anaphylaxis    PATIENT HAD A PCN REACTION WITH IMMEDIATE RASH, FACIAL/TONGUE/THROAT SWELLING, SOB, OR LIGHTHEADEDNESS WITH HYPOTENSION:  #  #  #  YES  #  #  #   Has patient had a PCN reaction causing severe rash involving mucus membranes or skin necrosis: No Has patient had a PCN reaction that required hospitalization No Has patient had a PCN reaction occurring within the last 10 years: No If all of the above answers are "NO", then may proceed with Cephalosporin use.   . Prozac [Fluoxetine Hcl] Hives and Swelling    SWELLING REACTION UNSPECIFIED    History reviewed. No pertinent family history.  Prior to Admission medications   Medication Sig Start Date End Date Taking? Authorizing Provider  albuterol (ACCUNEB) 0.63 MG/3ML nebulizer solution Take 1 ampule by nebulization every 6 (six) hours  as needed for wheezing.   Yes [provider]  citalopram (CELEXA) 20 MG tablet Take 40 mg by mouth daily.   Yes [provider]  clonazePAM (KLONOPIN) 0.5 MG tablet Take 0.5 mg by mouth every 8 (eight) hours.    Yes [provider]  cyclobenzaprine (FLEXERIL) 5 MG tablet Take 5 mg by mouth at bedtime.   Yes [provider]  divalproex (DEPAKOTE) 500 MG DR tablet Take  500 mg by mouth 2 (two) times daily.   Yes [provider]  gabapentin (NEURONTIN) 300 MG capsule Take 300-600 mg by mouth every 8 (eight) hours.    Yes [provider]  ibuprofen (ADVIL,MOTRIN) 200 MG tablet Take 800 mg by mouth every 6 (six) hours as needed for headache or mild pain.    Yes [provider]  lisinopril (PRINIVIL,ZESTRIL) 10 MG tablet Take 10 mg by mouth daily.   Yes [provider]  Lurasidone HCl (LATUDA) 120 MG TABS Take 120 mg by mouth daily.   Yes [provider]  oxyCODONE-acetaminophen (PERCOCET) 10-325 MG tablet Take 1 tablet by mouth every 6 (six) hours as needed for pain. for pain 09/28/16  Yes [provider]  aspirin 81 MG chewable tablet Chew 1 tablet (81 mg total) by mouth 2 (two) times daily. 10/24/16   Swinteck, Aaron Edelman, MD  docusate sodium (COLACE) 100 MG capsule Take 1 capsule (100 mg total) by mouth 2 (two) times daily. 10/24/16   Swinteck, Aaron Edelman, MD  HYDROmorphone (DILAUDID) 2 MG tablet Take 1 tablet (2 mg total) by mouth every 4 (four) hours as needed for severe pain. 10/24/16   Swinteck, Aaron Edelman, MD  ondansetron (ZOFRAN) 4 MG tablet Take 1 tablet (4 mg total) by mouth every 6 (six) hours as needed for nausea. 10/24/16   Swinteck, Aaron Edelman, MD  senna (SENOKOT) 8.6 MG TABS tablet Take 2 tablets (17.2 mg total) by mouth at bedtime. 10/24/16   Swinteck, Aaron Edelman, MD   Physical Exam: Blood pressure 105/68, pulse (!) 119, temperature 98.9 F (37.2 C), temperature source Oral, resp. rate 15, weight 112 kg (247 lb), SpO2 95 %. Vitals:   10/25/16 0408 10/25/16 0835  BP: (!) 110/57 105/68  Pulse: 61 (!) 119  Resp: 16 15  Temp: 97.8 F (36.6 C) 98.9 F (37.2 C)     General:  Obese sitting up in bed quite lethargic dozes off if not stimulated  Eyes: Pupils equal round reactive to light no scleral icterus EOMI  ENT: Ears clear nose without drainage oropharynx without erythema or exudate. Mucous membranes of her mouth are  slightly pale somewhat dry  Neck: Supple no JVD full range of motion no lymphadenopathy  Cardiovascular: Tachycardic but regular, +murmur trace to 1+ pitting lower extremity edema on right pedal pulses present and palpable.  Respiratory: Normal effort respirations somewhat shallow sounds slightly distant fine crackles left base no wheeze  Abdomen: Obese positive bowel sounds throughout no guarding or rebounding  Skin: No rashes or lesions noted. Skin is warm and dry  Musculoskeletal: Knee immobilizer to left knee. Toes on left foot warm eyes joints without swelling/erythema  Psychiatric: Cooperative appropriate  Neurologic: She is quite lethargic but arouses to verbal stimuli. Speech is slow clear follow simple commands very slowly dozes off when not stimulated. Falls asleep during conversation. Oriented 2  Labs on Admission:  Basic Metabolic Panel:  Recent Labs Lab 10/24/16 0618 10/25/16 0507  NA 132* 130*  K 5.0 5.0  CL 104 105  CO2  18* 17*  GLUCOSE 165* 106*  BUN 30* 35*  CREATININE 2.40* 2.01*  CALCIUM 8.3* 7.7*   Liver Function Tests: No results for input(s): AST, ALT, ALKPHOS, BILITOT, PROT, ALBUMIN in the last 168 hours. No results for input(s): LIPASE, AMYLASE in the last 168 hours. No results for input(s): AMMONIA in the last 168 hours. CBC:  Recent Labs Lab 10/24/16 0618 10/25/16 0507  WBC 8.2 5.8  HGB 9.9* 8.9*  HCT 31.3* 27.0*  MCV 86.5 86.3  PLT 155 114*   Cardiac Enzymes: No results for input(s): CKTOTAL, CKMB, CKMBINDEX, TROPONINI in the last 168 hours. BNP: Invalid input(s): POCBNP CBG: No results for input(s): GLUCAP in the last 168 hours.  Radiological Exams on Admission: Dg Knee Left Port  Result Date: 10/23/2016 CLINICAL DATA:  Status post left knee replacement EXAM: PORTABLE LEFT KNEE - 1-2 VIEW COMPARISON:  None. FINDINGS: Left knee replacement is noted in satisfactory position. Air is noted in the surgical bed. No acute bony  abnormality is seen. IMPRESSION: Status post left knee replacement without acute abnormality. Electronically Signed   By: Inez Catalina M.D.   On: 10/23/2016 11:42    EKG:   Time spent: 90 minutes  [facility]   If 7PM-7AM, please contact night-coverage [email or web address] Password [facility] 10/25/2016, 9:38 AM

## 2016-10-25 NOTE — Progress Notes (Signed)
NT informed RN that patient's BP was 72/44. RN rechecked patient's BP manually and it was 84/42. RN gave patient 500 mL bolus per MD verbal order.

## 2016-10-25 NOTE — Progress Notes (Signed)
Notified by primary service that patient with worsening acidosis now in BIPAP and may need intubation if doesn't improve. ABG shows a primarily metabolic acidosis related to patient's AKI. Mild lactic acidosis of 2.0. And mild hypercapnea of 45. RN says patient with low UOP. Will insert foley. Change NS @ 100cc/hr to Bicarb gtt @ 100cc/hr. Continue BIPAP as patient is comfortable on it. Repeat ABG tomorrow at 5am. Instructed RN to call us back if patient worsens.

## 2016-10-25 NOTE — Progress Notes (Signed)
Physical Therapy Treatment Patient Details Name: Diana Oneill MRN: [id number] DOB: [birth year] Today's Date: 10/25/2016    History of Present Illness Pt admitted for elective L TKA. Pt with no PMH or PSH on file.  Dealing now with postoperative hypotension with AKI, encephalopathy with solemance and hyponatremia.    PT Comments    Patient remains limited by lethargy.  Was able to keep eyes open while on her feet, but needed to focus on mobility due to too lethargic in chair/bed to participate in therex.  Able to negotiate stairs well, but increased assist for transfers this session as well as unable to walk more than 50' due to reported feeling weak and pain in tops of her legs.  Discussed needing someone to assist during PM hours when spouse not there.  States she can get a friend.  Would not send her home unless will have 24 hour capable assist.  If unable to obtain would recommend SNF level rehab.    Follow Up Recommendations  Home health PT;Supervision/Assistance - 24 hour     Equipment Recommendations  3in1 (PT)    Recommendations for Other Services       Precautions / Restrictions Precautions Precautions: Knee Restrictions Weight Bearing Restrictions: Yes LLE Weight Bearing: Weight bearing as tolerated    Mobility  Bed Mobility Overal bed mobility: Needs Assistance Bed Mobility: Supine to Sit     Supine to sit: Min assist     General bed mobility comments: increased time, use of rail, assist to scoot and for R LE  Transfers Overall transfer level: Needs assistance Equipment used: Rolling walker (2 wheeled) Transfers: Sit to/from Stand Sit to Stand: Mod assist         General transfer comment: increased time, effortful and with cues for hand placement  Ambulation/Gait Ambulation/Gait assistance: Min assist Ambulation Distance (Feet): 50 Feet (&30') Assistive device: Rolling walker (2 wheeled) Gait Pattern/deviations: Step-through pattern;Decreased  stride length;Trunk flexed     General Gait Details: heavy to move legs, very slow and reported limited due to pain above knees on both legs    Stairs Stairs: Yes   Stair Management: Two rails Number of Stairs: 4 General stair comments: cues for sequence, assist for safety  Wheelchair Mobility    Modified Rankin (Stroke Patients Only)       Balance Overall balance assessment: Needs assistance Sitting-balance support: Feet supported Sitting balance-Leahy Scale: Good     Standing balance support: Bilateral upper extremity supported Standing balance-Leahy Scale: Poor Standing balance comment: UE support for balance                            Cognition Arousal/Alertness: Lethargic Behavior During Therapy: WFL for tasks assessed/performed Overall Cognitive Status: Impaired/Different from baseline Area of Impairment: Following commands;Memory                       Following Commands: Follows one step commands with increased time       General Comments: remembered having a blue [employer] bag "down in therapy room" in drawer, thought her cell phone in bag, but was on B/S table      Exercises      General Comments General comments (skin integrity, edema, etc.): eyes open about 50% of session (during mobility, mostly, but closed and mumbling during transport to [employer] for stair negotiation)      Pertinent Vitals/Pain Faces Pain Scale: Hurts little more Pain  Location: L knee Pain Descriptors / Indicators: Sore Pain Intervention(s): Monitored during session;Limited activity within patient's tolerance;Repositioned    Home Living                      Prior Function            PT Goals (current goals can now be found in the care plan section) Progress towards PT goals: Progressing toward goals (slow)    Frequency    7X/week      PT Plan Current plan remains appropriate    Co-evaluation              AM-PAC PT "6  Clicks" Daily Activity  Outcome Measure  Difficulty turning over in bed (including adjusting bedclothes, sheets and blankets)?: A Lot Difficulty moving from lying on back to sitting on the side of the bed? : Total Difficulty sitting down on and standing up from a chair with arms (e.g., wheelchair, bedside commode, etc,.)?: Total Help needed moving to and from a bed to chair (including a wheelchair)?: A Little Help needed walking in hospital room?: A Little Help needed climbing 3-5 steps with a railing? : A Little 6 Click Score: 13    End of Session Equipment Utilized During Treatment: Gait belt Activity Tolerance: Patient limited by lethargy Patient left: in chair;with call bell/phone within reach   PT Visit Diagnosis: Pain;Difficulty in walking, not elsewhere classified (R26.2) Pain - Right/Left: Left Pain - part of body: Knee     Time: 1007-1219 PT Time Calculation (min) (ACUTE ONLY): 32 min  Charges:  $Gait Training: 23-37 mins                    G CodesMagda Oneill, [place] 10/25/2016    Diana Oneill 10/25/2016, 11:57 AM

## 2016-10-26 ENCOUNTER — Encounter (HOSPITAL_COMMUNITY): Payer: Self-pay | Admitting: General Practice

## 2016-10-26 DIAGNOSIS — N179 Acute kidney failure, unspecified: Secondary | ICD-10-CM

## 2016-10-26 DIAGNOSIS — J9600 Acute respiratory failure, unspecified whether with hypoxia or hypercapnia: Secondary | ICD-10-CM

## 2016-10-26 LAB — BLOOD GAS, ARTERIAL
Acid-base deficit: 5.1 mmol/L — ABNORMAL HIGH (ref 0.0–2.0)
BICARBONATE: 20.5 mmol/L (ref 20.0–28.0)
Delivery systems: POSITIVE
Drawn by: 39898
EXPIRATORY PAP: 5
FIO2: 40
Inspiratory PAP: 10
O2 SAT: 98.6 %
PATIENT TEMPERATURE: 98.6
PH ART: 7.279 — AB (ref 7.350–7.450)
PO2 ART: 136 mmHg — AB (ref 83.0–108.0)
pCO2 arterial: 45.2 mmHg (ref 32.0–48.0)

## 2016-10-26 LAB — CBC
HEMATOCRIT: 25.6 % — AB (ref 36.0–46.0)
Hemoglobin: 8.3 g/dL — ABNORMAL LOW (ref 12.0–15.0)
MCH: 27.7 pg (ref 26.0–34.0)
MCHC: 32.4 g/dL (ref 30.0–36.0)
MCV: 85.3 fL (ref 78.0–100.0)
PLATELETS: 112 10*3/uL — AB (ref 150–400)
RBC: 3 MIL/uL — AB (ref 3.87–5.11)
RDW: 15 % (ref 11.5–15.5)
WBC: 4.2 10*3/uL (ref 4.0–10.5)

## 2016-10-26 LAB — BASIC METABOLIC PANEL
ANION GAP: 7 (ref 5–15)
BUN: 35 mg/dL — ABNORMAL HIGH (ref 6–20)
CO2: 20 mmol/L — AB (ref 22–32)
Calcium: 8 mg/dL — ABNORMAL LOW (ref 8.9–10.3)
Chloride: 107 mmol/L (ref 101–111)
Creatinine, Ser: 1.78 mg/dL — ABNORMAL HIGH (ref 0.44–1.00)
GFR, EST AFRICAN AMERICAN: 35 mL/min — AB (ref >60)
GFR, EST NON AFRICAN AMERICAN: 30 mL/min — AB (ref >60)
Glucose, Bld: 89 mg/dL (ref 65–99)
POTASSIUM: 5 mmol/L (ref 3.5–5.1)
Sodium: 134 mmol/L — ABNORMAL LOW (ref 135–145)

## 2016-10-26 LAB — LACTIC ACID, PLASMA: Lactic Acid, Venous: 0.9 mmol/L (ref 0.5–1.9)

## 2016-10-26 MED ORDER — ALBUTEROL SULFATE (2.5 MG/3ML) 0.083% IN NEBU: 2.5000 mg | INHALATION_SOLUTION | RESPIRATORY_TRACT | Status: DC | PRN

## 2016-10-26 NOTE — Progress Notes (Signed)
Physical Therapy Treatment Patient Details Name: Diana Oneill MRN: [id number] DOB: [birth year] Today's Date: 10/26/2016    History of Present Illness Pt admitted for elective L TKA. Pt with no PMH or PSH on file.  Dealing now with postoperative hypotension with AKI, encephalopathy with solemance and hyponatremia.    PT Comments    Pt admitted with above diagnosis. Pt currently with functional limitations due to balance and endurance deficits. Pt progressing with ambulation.  Needed O2 at 3L for ambulation with sats 93-98%.  Hr 94-132 bpm.  Will follow acutely.  Pt will benefit from skilled PT to increase their independence and safety with mobility to allow discharge to the venue listed below.     Follow Up Recommendations  SNF;Supervision/Assistance - 24 hour     Equipment Recommendations  3in1 (PT)    Recommendations for Other Services       Precautions / Restrictions Precautions Precautions: Knee Precaution Booklet Issued: No Restrictions Weight Bearing Restrictions: Yes LLE Weight Bearing: Weight bearing as tolerated    Mobility  Bed Mobility Overal bed mobility: Needs Assistance Bed Mobility: Supine to Sit     Supine to sit: Min assist     General bed mobility comments: assist for both legs, increased time and cues with use of pad to scoot to eOB.   Transfers Overall transfer level: Needs assistance Equipment used: Rolling walker (2 wheeled) Transfers: Sit to/from Stand Sit to Stand: Mod assist;Min assist;+2 safety/equipment         General transfer comment: increased time, lifting assist. lowering assist as sits with uncontrolled descent.  Cues for hand placement and pt uses roccking momentum to come to stand needing several attempts at times.   Ambulation/Gait Ambulation/Gait assistance: Min assist;+2 safety/equipment Ambulation Distance (Feet): 50 Feet (20 feet, 30 feet) Assistive device: Rolling walker (2 wheeled) Gait Pattern/deviations:  Shuffle;Trunk flexed;Decreased stride length;Step-to pattern Gait velocity: slow Gait velocity interpretation: Below normal speed for age/gender General Gait Details: Ambulated to door with one seated rest break. Then ambulated 30 feet more and had to rest again.  Too fatigued to do it again. Followed with chair.    Stairs            Wheelchair Mobility    Modified Rankin (Stroke Patients Only)       Balance Overall balance assessment: Needs assistance Sitting-balance support: Feet supported;No upper extremity supported Sitting balance-Leahy Scale: Good     Standing balance support: Bilateral upper extremity supported Standing balance-Leahy Scale: Poor Standing balance comment: UE support for balance                            Cognition Arousal/Alertness: Awake/alert Behavior During Therapy: WFL for tasks assessed/performed Overall Cognitive Status: Impaired/Different from baseline Area of Impairment: Orientation                 Orientation Level: Disoriented to;Situation     Following Commands: Follows one step commands with increased time              Exercises Total Joint Exercises Ankle Circles/Pumps: AROM;Both;10 reps;Supine Quad Sets:  (with max cues and assist due to lethargy ) Long Arc Quad: AROM;Left;10 reps;Seated Knee Flexion: AAROM;10 reps;Supine Goniometric ROM: 8-90 degrees    General Comments        Pertinent Vitals/Pain Pain Assessment: Faces Faces Pain Scale: Hurts even more Pain Location: L knee Pain Descriptors / Indicators: Sore Pain Intervention(s): Limited activity within patient's tolerance;Monitored during  session;Repositioned;Patient requesting pain meds-RN notified    Home Living                      Prior Function            PT Goals (current goals can now be found in the care plan section) Progress towards PT goals: Progressing toward goals    Frequency    7X/week      PT Plan  Current plan remains appropriate    Co-evaluation              AM-PAC PT "6 Clicks" Daily Activity  Outcome Measure  Difficulty turning over in bed (including adjusting bedclothes, sheets and blankets)?: A Lot Difficulty moving from lying on back to sitting on the side of the bed? : A Lot Difficulty sitting down on and standing up from a chair with arms (e.g., wheelchair, bedside commode, etc,.)?: A Lot Help needed moving to and from a bed to chair (including a wheelchair)?: A Lot Help needed walking in hospital room?: A Little Help needed climbing 3-5 steps with a railing? : Total 6 Click Score: 12    End of Session Equipment Utilized During Treatment: Gait belt;Oxygen Activity Tolerance: Patient limited by fatigue;Patient limited by pain Patient left: with call bell/phone within reach;in chair Nurse Communication: Mobility status;Patient requests pain meds PT Visit Diagnosis: Pain;Difficulty in walking, not elsewhere classified (R26.2);Other symptoms and signs involving the nervous system (R29.898) Pain - Right/Left: Left Pain - part of body: Knee     Time: 1219-1250 PT Time Calculation (min) (ACUTE ONLY): 31 min  Charges:  $Gait Training: 8-22 mins $Therapeutic Exercise: 8-22 mins                    G Codes:       Rahil Passey,PT Acute Rehabilitation [phone] (pager)    Denice Paradise 10/26/2016, 2:15 PM

## 2016-10-26 NOTE — Progress Notes (Signed)
Report called and given to RN Mindy. Patient's belongings packed and transported to room assigned 4E03.

## 2016-10-26 NOTE — Consult Note (Signed)
Name: Diana Oneill MRN: [id number] DOB: 1956-03-03    ADMISSION DATE:  10/23/2016 CONSULTATION DATE:  10/23/16  REFERRING MD :  Marily Memos  CHIEF COMPLAINT:  SOB   HISTORY OF PRESENT ILLNESS:  Diana Oneill is a 61 y.o. female with a PMH as outlined below.  She was admitted for left ht total knee arthroplasty 6/25.  She has done well from ortho standpoint post op; however, she developed AKI with a metabolic acidosis post op.  She was placed on BiPAP 6/27 for mild increased WOB and PCCM was given a "heads up" that she may require transfer to ICU.  She was evaluated by bedside team PM of 6/27 and pt was comfortable on BiPAP.  Fluids were changed from NS to HCO3.  ABG demonstrated metabolic acidosis.  AM 6/28, ABG with persistent metabolic acidosis.  Chemistry reveals NON-anion gap acidosis with persistent hyponatremia and AKI.  Pt is no longer on BiPAP this AM, she is resting comfortably on room air.  PAST MEDICAL HISTORY :   has a past medical history of Acute kidney injury ([place]); Anemia; Anxiety; Arthritis; Cancer [facility]); COPD (chronic obstructive pulmonary disease) ([place]); Depression; Dyspnea; History of kidney stones; Hypertension; Hyponatremia; Osteoarthritis; Sleep apnea; and Thrombocytopenia ([place]).  has a past surgical history that includes Abdominal hysterectomy; Joint replacement (Right); Gastric bypass; Achilles tendon repair (Left); and Knee Arthroplasty (Left, 10/23/2016). Prior to Admission medications   Medication Sig Start Date End Date Taking? Authorizing Provider  albuterol (ACCUNEB) 0.63 MG/3ML nebulizer solution Take 1 ampule by nebulization every 6 (six) hours as needed for wheezing.   Yes [provider]  citalopram (CELEXA) 20 MG tablet Take 40 mg by mouth daily.   Yes [provider]  clonazePAM (KLONOPIN) 0.5 MG tablet Take 0.5 mg by mouth every 8 (eight) hours.    Yes [provider]  cyclobenzaprine (FLEXERIL) 5 MG tablet Take  5 mg by mouth at bedtime.   Yes [provider]  divalproex (DEPAKOTE) 500 MG DR tablet Take 500 mg by mouth 2 (two) times daily.   Yes [provider]  gabapentin (NEURONTIN) 300 MG capsule Take 300-600 mg by mouth every 8 (eight) hours.    Yes [provider]  ibuprofen (ADVIL,MOTRIN) 200 MG tablet Take 800 mg by mouth every 6 (six) hours as needed for headache or mild pain.    Yes [provider]  lisinopril (PRINIVIL,ZESTRIL) 10 MG tablet Take 10 mg by mouth daily.   Yes [provider]  Lurasidone HCl (LATUDA) 120 MG TABS Take 120 mg by mouth daily.   Yes [provider]  oxyCODONE-acetaminophen (PERCOCET) 10-325 MG tablet Take 1 tablet by mouth every 6 (six) hours as needed for pain. for pain 09/28/16  Yes [provider]  aspirin 81 MG chewable tablet Chew 1 tablet (81 mg total) by mouth 2 (two) times daily. 10/24/16   Swinteck, Aaron Edelman, MD  docusate sodium (COLACE) 100 MG capsule Take 1 capsule (100 mg total) by mouth 2 (two) times daily. 10/24/16   Swinteck, Aaron Edelman, MD  HYDROmorphone (DILAUDID) 2 MG tablet Take 1 tablet (2 mg total) by mouth every 4 (four) hours as needed for severe pain. 10/24/16   Swinteck, Aaron Edelman, MD  ondansetron (ZOFRAN) 4 MG tablet Take 1 tablet (4 mg total) by mouth every 6 (six) hours as needed for nausea. 10/24/16   Swinteck, Aaron Edelman, MD  senna (SENOKOT) 8.6 MG TABS tablet Take 2 tablets (17.2 mg total) by mouth at bedtime. 10/24/16  Rod Can, MD   Allergies  Allergen Reactions  . Penicillins Anaphylaxis    PATIENT HAD A PCN REACTION WITH IMMEDIATE RASH, FACIAL/TONGUE/THROAT SWELLING, SOB, OR LIGHTHEADEDNESS WITH HYPOTENSION:  #  #  #  YES  #  #  #   Has patient had a PCN reaction causing severe rash involving mucus membranes or skin necrosis: No Has patient had a PCN reaction that required hospitalization No Has patient had a PCN reaction occurring within the last 10 years: No If all of the above answers  are "NO", then may proceed with Cephalosporin use.   . Prozac [Fluoxetine Hcl] Hives and Swelling    SWELLING REACTION UNSPECIFIED     FAMILY HISTORY:  family history is not on file. SOCIAL HISTORY:  reports that she quit smoking about 2 months ago. Her smoking use included Cigarettes. She has a 40.00 pack-year smoking history. She has never used smokeless tobacco. She reports that she uses drugs. She reports that she does not drink alcohol.  REVIEW OF SYSTEMS:   All negative; except for those that are bolded, which indicate positives.  Constitutional: weight loss, weight gain, night sweats, fevers, chills, fatigue, weakness.  HEENT: headaches, sore throat, sneezing, nasal congestion, post nasal drip, difficulty swallowing, tooth/dental problems, visual complaints, visual changes, ear aches. Neuro: difficulty with speech, weakness, numbness, ataxia. CV:  chest pain, orthopnea, PND, swelling in lower extremities, dizziness, palpitations, syncope.  Resp: cough, hemoptysis, dyspnea, wheezing. GI: heartburn, indigestion, abdominal pain, nausea, vomiting, diarrhea, constipation, change in bowel habits, loss of appetite, hematemesis, melena, hematochezia.  GU: dysuria, change in color of urine, urgency or frequency, flank pain, hematuria. MSK: joint pain or swelling, decreased range of motion, left knee pain. Psych: change in mood or affect, depression, anxiety, suicidal ideations, homicidal ideations. Skin: rash, itching, bruising.    SUBJECTIVE:  Denies SOB.  Breathing comfortably on room air.  VITAL SIGNS: Temp:  [97.4 F (36.3 C)-98.9 F (37.2 C)] 97.5 F (36.4 C) (06/28 0700) Pulse Rate:  [68-119] 92 (06/28 0700) Resp:  [12-27] 15 (06/28 0700) BP: (84-122)/(40-94) 99/54 (06/28 0700) SpO2:  [92 %-100 %] 98 % (06/28 0700) FiO2 (%):  [40 %] 40 % (06/28 0410)  PHYSICAL EXAMINATION: General: Adult female, resting in bed, in NAD. Neuro: A&O x 3, non-focal.  HEENT: [place]/AT. PERRL,  sclerae anicteric. Cardiovascular: RRR, no M/R/G.  Lungs: Respirations even and unlabored.  CTA bilaterally, No W/R/R. Abdomen: BS x 4, soft, NT/ND.  Musculoskeletal: No gross deformities, no edema. Left knee brace in place. Skin: Intact, warm, no rashes.    Recent Labs Lab 10/25/16 0507 10/25/16 1453 10/26/16 0503  NA 130* 129* 134*  K 5.0 5.2* 5.0  CL 105 105 107  CO2 17* 17* 20*  BUN 35* 36* 35*  CREATININE 2.01* 2.23* 1.78*  GLUCOSE 106* 94 89    Recent Labs Lab 10/24/16 0618 10/25/16 0507 10/26/16 0503  HGB 9.9* 8.9* 8.3*  HCT 31.3* 27.0* 25.6*  WBC 8.2 5.8 4.2  PLT 155 114* 112*   Dg Chest Port 1 View  Result Date: 10/25/2016 CLINICAL DATA:  61 year old female with hypoxia. Medical history includes hypertension, COPD and dyspnea EXAM: PORTABLE CHEST 1 VIEW COMPARISON:  Prior chest x-ray 10/25/2016 FINDINGS: Stable borderline cardiomegaly. Mild chronic vascular congestion without overt edema, no interval change compared to prior. No focal airspace consolidation, pleural effusion, pneumothorax or atelectasis. Osseous structures are intact and unremarkable for age. IMPRESSION: Stable chest x-ray without acute cardiopulmonary process. Stable mild cardiomegaly and low  inspiratory volumes. Electronically Signed   By: Jacqulynn Cadet M.D.   On: 10/25/2016 19:00   Dg Chest Port 1 View  Result Date: 10/25/2016 CLINICAL DATA:  Cough. EXAM: PORTABLE CHEST 1 VIEW COMPARISON:  12/22/2015 . FINDINGS: Mild cardiomegaly. No pulmonary venous congestion. Low lung volumes. No focal alveolar infiltrate. No pleural effusion or pneumothorax. IMPRESSION: 1. Cardiomegaly.  No pulmonary venous congestion. 2. Low lung volumes . Electronically Signed   By: Marcello Moores  Register   On: 10/25/2016 10:39    STUDIES:  CXR 6/27 > normal.   SIGNIFICANT EVENTS  6/25 > admitted for LKA. 6/27 > placed on BiPAP, PCCM given "heads up". 6/28 > off BiPAP.  ASSESSMENT / PLAN:  Hyponatremia -  improving. AKI - improving. NON-anion gap metabolic acidosis - ? RTA. Plan: Continue bicarb gtt. Correct electrolytes as indicated.  COPD per report (no PFT's in system) - quit smoking roughly 2 months ago. Plan: Albuterol PRN. Continue smoking cessation.  Increased WOB - required BiPPA 6/27, currently off 6/28. Plan: BiPAP PRN increased WOB. No role intubation despite ABG demonstrating acidemia as this is a metabolic process (that is gradually resolving).  Left knee arthritis - s/p TKA 6/25. Plan: Post op care per ortho.   Nothing further to add.  PCCM will sign off.  Please do not hesitate to call us back if we can be of any further assistance.   Montey Hora, [facility] Pager: [phone]  or [phone] 10/26/2016, 8:27 AM

## 2016-10-26 NOTE — Progress Notes (Signed)
Pt was on 3lpm [place] tolerating well when I entered room. Pt is alert & talking on the phone. Bipap not needed at this time

## 2016-10-26 NOTE — NC FL2 (Signed)
[place] LEVEL OF CARE SCREENING TOOL     IDENTIFICATION  Patient Name: Diana Oneill Birthdate: [birth year] Sex: female Admission Date (Current Location): 10/23/2016  [facility] and [place] Number:  [employer] and Address:  [facility] [place], [place] [address], [place], [place] [address]      Provider Number: [id number]  Attending Physician Name and Address:  Rod Can, MD  Relative Name and Phone Number:       Current Level of Care: Hospital Recommended Level of Care: [place] Prior Approval Number:    Date Approved/Denied:   PASRR Number: [id number]  Discharge Plan: SNF    Current Diagnoses: Patient Active Problem List   Diagnosis Date Noted  . Acute encephalopathy 10/25/2016  . Essential hypertension 10/25/2016  . Obesity 10/25/2016  . Acidosis 10/25/2016  . Nausea without vomiting 10/25/2016  . Tachycardia 10/25/2016  . COPD (chronic obstructive pulmonary disease) ([place]) 10/25/2016  . Acute respiratory failure ([place]) 10/25/2016  . Hyponatremia   . Acute kidney injury ([place])   . Anemia   . Thrombocytopenia ([place])   . Osteoarthritis of left knee 10/23/2016    Orientation RESPIRATION BLADDER Height & Weight     Self, Time, Situation, Place  Normal, Other (Comment) (Bipap: 10/5 at 40%) Continent, Indwelling catheter Weight: 247 lb (112 kg) Height:     BEHAVIORAL SYMPTOMS/MOOD NEUROLOGICAL BOWEL NUTRITION STATUS   (None)  (None) Continent Diet (Regular)  AMBULATORY STATUS COMMUNICATION OF NEEDS Skin   Limited Assist Verbally Surgical wounds, Other (Comment) Physiological scientist)                       Personal Care Assistance Level of Assistance  Bathing, Feeding, Dressing Bathing Assistance: Limited assistance Feeding assistance: Limited assistance Dressing Assistance: Limited assistance     Functional Limitations Info  Sight, Hearing, Speech Sight Info: Adequate Hearing Info: Adequate Speech  Info: Adequate    SPECIAL CARE FACTORS FREQUENCY  PT (By licensed PT), Blood pressure, OT (By licensed OT)     PT Frequency: 5 x week OT Frequency: 5 x week            Contractures Contractures Info: Not present    Additional Factors Info  Code Status, Allergies Code Status Info: Full Allergies Info: Penicillins, Prozac (Fluoxetine Hcl)           Current Medications (10/26/2016):  This is the current hospital active medication list Current Facility-Administered Medications  Medication Dose Route Frequency Provider Last Rate Last Dose  . acetaminophen (TYLENOL) tablet 650 mg  650 mg Oral Q6H PRN Rod Can, MD   650 mg at 10/25/16 2225   Or  . acetaminophen (TYLENOL) suppository 650 mg  650 mg Rectal Q6H PRN Swinteck, Aaron Edelman, MD      . albuterol (PROVENTIL) (2.5 MG/3ML) 0.083% nebulizer solution 2.5 mg  2.5 mg Nebulization Q3H PRN Desai, Rahul P, PA-C      . alum & mag hydroxide-simeth (MAALOX/MYLANTA) 200-200-20 MG/5ML suspension 30 mL  30 mL Oral Q4H PRN Swinteck, Aaron Edelman, MD      . aspirin chewable tablet 81 mg  81 mg Oral BID Rod Can, MD   81 mg at 10/26/16 1021  . citalopram (CELEXA) tablet 40 mg  40 mg Oral Daily Rod Can, MD   40 mg at 10/26/16 1021  . clonazePAM (KLONOPIN) tablet 0.5 mg  0.5 mg Oral BID PRN Radene Gunning, NP      . diphenhydrAMINE (  BENADRYL) 12.5 MG/5ML elixir 6.25 mg  6.25 mg Oral Q8H PRN Radene Gunning, NP      . divalproex (DEPAKOTE) DR tablet 500 mg  500 mg Oral BID Rod Can, MD   500 mg at 10/26/16 1022  . docusate sodium (COLACE) capsule 100 mg  100 mg Oral BID Rod Can, MD   100 mg at 10/26/16 1021  . gabapentin (NEURONTIN) capsule 200 mg  200 mg Oral BID Dyanne Carrel M, NP   200 mg at 10/26/16 1022  . HYDROmorphone (DILAUDID) tablet 1 mg  1 mg Oral Q6H PRN Radene Gunning, NP      . lurasidone (LATUDA) tablet 120 mg  120 mg Oral QHS Rod Can, MD   120 mg at 10/24/16 2136  . menthol-cetylpyridinium (CEPACOL)  lozenge 3 mg  1 lozenge Oral PRN Swinteck, Aaron Edelman, MD       Or  . phenol (CHLORASEPTIC) mouth spray 1 spray  1 spray Mouth/Throat PRN Swinteck, Aaron Edelman, MD      . methocarbamol (ROBAXIN) tablet 500 mg  500 mg Oral Q6H PRN Rod Can, MD   500 mg at 10/23/16 1938   Or  . methocarbamol (ROBAXIN) 500 mg in dextrose 5 % 50 mL IVPB  500 mg Intravenous Q6H PRN Swinteck, Aaron Edelman, MD      . metoCLOPramide (REGLAN) tablet 5-10 mg  5-10 mg Oral Q8H PRN Swinteck, Aaron Edelman, MD       Or  . metoCLOPramide (REGLAN) injection 5-10 mg  5-10 mg Intravenous Q8H PRN Swinteck, Aaron Edelman, MD      . naloxone Karma Greaser) injection 0.4 mg  0.4 mg Intravenous PRN Rod Can, MD   0.4 mg at 10/24/16 1713  . ondansetron (ZOFRAN) tablet 4 mg  4 mg Oral Q6H PRN Swinteck, Aaron Edelman, MD       Or  . ondansetron (ZOFRAN) injection 4 mg  4 mg Intravenous Q6H PRN Swinteck, Aaron Edelman, MD      . oxyCODONE-acetaminophen (PERCOCET) 7.5-325 MG per tablet 1 tablet  1 tablet Oral Q6H PRN Radene Gunning, NP   1 tablet at 10/26/16 1259  . polyethylene glycol (MIRALAX / GLYCOLAX) packet 17 g  17 g Oral Daily PRN Rod Can, MD   17 g at 10/24/16 0858  . senna (SENOKOT) tablet 17.2 mg  2 tablet Oral QHS Rod Can, MD   17.2 mg at 10/25/16 2225  . sodium bicarbonate 150 mEq in sterile water 1,000 mL infusion   Intravenous Continuous Hammonds, Sharyn Blitz, MD 100 mL/hr at 10/26/16 1256       Discharge Medications: Please see discharge summary for a list of discharge medications.  Relevant Imaging Results:  Relevant Lab Results:   Additional Information SS#: [id number]  Candie Chroman, LCSW

## 2016-10-26 NOTE — Clinical Social Work Note (Signed)
Clinical Social Work Assessment  Patient Details  Name: Diana Oneill MRN: [id number] Date of Birth: 05/24/1955  Date of referral:  10/26/16               Reason for consult:  Facility Placement, Discharge Planning                Permission sought to share information with:  Chartered certified accountant granted to share information::  Yes, Verbal Permission Granted  Name::        Agency::  SNF's  Relationship::     Contact Information:     Housing/Transportation Living arrangements for the past 2 months:  Single Family Home Source of Information:  Patient, Medical Team Patient Interpreter Needed:  None Criminal Activity/Legal Involvement Pertinent to Current Situation/Hospitalization:  No - Comment as needed Significant Relationships:  Spouse Lives with:  Spouse Do you feel safe going back to the place where you live?  Yes Need for family participation in patient care:  Yes (Comment)  Care giving concerns:  PT recommending SNF once medically stable for discharge.   Social Worker assessment / plan:  CSW met with patient. No supports at bedside. CSW introduced role and explained that PT recommendations would be discussed. Patient had initially hoped for outpatient PT but is agreeable to SNF placement in the [place] area. No preference for facility. CSW provided SNF list for review. No further concerns. CSW encouraged patient to contact CSW as needed. CSW will continue to follow patient for support and facilitate discharge to SNF once medically stable.  Employment status:  Disabled (Comment on whether or not currently receiving Disability) Insurance information:    PT Recommendations:  [place] / Referral to community resources:  [place]  Patient/Family's Response to care:  Patient agreeable to SNF placement. Patient's husband supportive and involved in patient's care. Patient appreciated social work  intervention.  Patient/Family's Understanding of and Emotional Response to Diagnosis, Current Treatment, and Prognosis:  Patient has a good understanding of the reason for admission and her need for rehab prior to returning home. Patient appears happy with hospital care.  Emotional Assessment Appearance:  Appears stated age Attitude/Demeanor/Rapport:  Other, Lethargic (Pleasant) Affect (typically observed):  Accepting, Appropriate, Calm, Pleasant Orientation:  Oriented to Self, Oriented to Place, Oriented to  Time, Oriented to Situation Alcohol / Substance use:  Never Used Psych involvement (Current and /or in the community):  No (Comment)  Discharge Needs  Concerns to be addressed:  Care Coordination Readmission within the last 30 days:  No Current discharge risk:  Dependent with Mobility Barriers to Discharge:  Continued Medical Work up   Candie Chroman, LCSW 10/26/2016, 3:53 PM

## 2016-10-26 NOTE — Progress Notes (Signed)
   Subjective:  Patient reports pain as mild to moderate.  Denies N/V/CP/SOB. Transferred to step down for respiratory failure - on BiPap o/n.   Objective:   VITALS:   Vitals:   10/25/16 2325 10/26/16 0000 10/26/16 0410 10/26/16 0424  BP:  94/62  122/78  Pulse: 77 75  68  Resp: 15 15 19 12   Temp:    97.4 F (36.3 C)  TempSrc:    Axillary  SpO2: 100% 100%  100%  Weight:        NAD ABD soft Sensation intact distally Intact pulses distally Dorsiflexion/Plantar flexion intact Incision: dressing C/D/I Compartment soft   Lab Results  Component Value Date   WBC 4.2 10/26/2016   HGB 8.3 (L) 10/26/2016   HCT 25.6 (L) 10/26/2016   MCV 85.3 10/26/2016   PLT 112 (L) 10/26/2016   BMET    Component Value Date/Time   NA 134 (L) 10/26/2016 0503   K 5.0 10/26/2016 0503   CL 107 10/26/2016 0503   CO2 20 (L) 10/26/2016 0503   GLUCOSE 89 10/26/2016 0503   BUN 35 (H) 10/26/2016 0503   CREATININE 1.78 (H) 10/26/2016 0503   CALCIUM 8.0 (L) 10/26/2016 0503   GFRNONAA 30 (L) 10/26/2016 0503   GFRAA 35 (L) 10/26/2016 0503     Assessment/Plan: 3 Days Post-Op   Principal Problem:   Acute kidney injury ([place]) Active Problems:   Osteoarthritis of left knee   Hyponatremia   Anemia   Thrombocytopenia (H[facility])   Acute encephalopathy   Essential hypertension   Obesity   Acidosis   Nausea without vomiting   Tachycardia   COPD (chronic obstructive pulmonary disease) ([place])   Acute respiratory failure ([place])   WBAT with walker DVT ppx: ASA, SCDs, TEDs PO pain control: PO pain control ARF: per hospitalist team ABLA: asymptomatic, monitor PT/OT Dispo: medical care per hospitalist team, will d/c home with outpatient PT when medically ready   Diana Oneill, Diana Oneill 10/26/2016, 7:45 AM   Rod Can, MD Cell [phone]

## 2016-10-26 NOTE — Clinical Social Work Placement (Signed)
   CLINICAL SOCIAL WORK PLACEMENT  NOTE  Date:  10/26/2016  Patient Details  Name: Diana Oneill MRN: [id number] Date of Birth: 1955-05-16  Clinical Social Work is seeking post-discharge placement for this patient at the [place] level of care (*CSW will initial, date and re-position this form in  chart as items are completed):  Yes   Patient/family provided with [place] Work Department's list of facilities offering this level of care within the geographic area requested by the patient (or if unable, by the patient's family).  Yes   Patient/family informed of their freedom to choose among providers that offer the needed level of care, that participate in Medicare, Medicaid or managed care program needed by the patient, have an available bed and are willing to accept the patient.  Yes   Patient/family informed of [place]'s ownership interest in [facility] and [facility], as well as of the fact that they are under no obligation to receive care at these facilities.  PASRR submitted to EDS on 10/26/16     PASRR number received on 10/26/16     Existing PASRR number confirmed on       FL2 transmitted to all facilities in geographic area requested by pt/family on 10/26/16     FL2 transmitted to all facilities within larger geographic area on       Patient informed that his/her managed care company has contracts with or will negotiate with certain facilities, including the following:            Patient/family informed of bed offers received.  Patient chooses bed at       Physician recommends and patient chooses bed at      Patient to be transferred to   on  .  Patient to be transferred to facility by       Patient family notified on   of transfer.  Name of family member notified:        PHYSICIAN Please sign FL2     Additional Comment:    _______________________________________________ Candie Chroman,  LCSW 10/26/2016, 3:55 PM

## 2016-10-26 NOTE — Plan of Care (Signed)
Problem: Physical Regulation: Goal: Ability to maintain clinical measurements within normal limits will improve Outcome: Progressing Vital signs stable at this time

## 2016-10-27 DIAGNOSIS — E662 Morbid (severe) obesity with alveolar hypoventilation: Secondary | ICD-10-CM

## 2016-10-27 DIAGNOSIS — G4733 Obstructive sleep apnea (adult) (pediatric): Secondary | ICD-10-CM

## 2016-10-27 DIAGNOSIS — E870 Hyperosmolality and hypernatremia: Secondary | ICD-10-CM

## 2016-10-27 DIAGNOSIS — J431 Panlobular emphysema: Secondary | ICD-10-CM

## 2016-10-27 LAB — BASIC METABOLIC PANEL
ANION GAP: 6 (ref 5–15)
BUN: 27 mg/dL — ABNORMAL HIGH (ref 6–20)
CALCIUM: 8.3 mg/dL — AB (ref 8.9–10.3)
CO2: 27 mmol/L (ref 22–32)
CREATININE: 1.11 mg/dL — AB (ref 0.44–1.00)
Chloride: 102 mmol/L (ref 101–111)
GFR, EST NON AFRICAN AMERICAN: 53 mL/min — AB (ref >60)
GLUCOSE: 87 mg/dL (ref 65–99)
Potassium: 4.8 mmol/L (ref 3.5–5.1)
Sodium: 135 mmol/L (ref 135–145)

## 2016-10-27 LAB — COMPREHENSIVE METABOLIC PANEL
ALBUMIN: 2.4 g/dL — AB (ref 3.5–5.0)
ALT: 12 U/L — ABNORMAL LOW (ref 14–54)
ANION GAP: 6 (ref 5–15)
AST: 17 U/L (ref 15–41)
Alkaline Phosphatase: 42 U/L (ref 38–126)
BILIRUBIN TOTAL: 0.6 mg/dL (ref 0.3–1.2)
BUN: 24 mg/dL — ABNORMAL HIGH (ref 6–20)
CHLORIDE: 102 mmol/L (ref 101–111)
CO2: 28 mmol/L (ref 22–32)
Calcium: 8.7 mg/dL — ABNORMAL LOW (ref 8.9–10.3)
Creatinine, Ser: 1 mg/dL (ref 0.44–1.00)
GFR calc Af Amer: >60 mL/min (ref >60)
GFR, EST NON AFRICAN AMERICAN: 60 mL/min — AB (ref >60)
Glucose, Bld: 119 mg/dL — ABNORMAL HIGH (ref 65–99)
POTASSIUM: 4.9 mmol/L (ref 3.5–5.1)
Sodium: 136 mmol/L (ref 135–145)
TOTAL PROTEIN: 4.7 g/dL — AB (ref 6.5–8.1)

## 2016-10-27 LAB — BLOOD GAS, ARTERIAL
ACID-BASE EXCESS: 4.8 mmol/L — AB (ref 0.0–2.0)
BICARBONATE: 29.4 mmol/L — AB (ref 20.0–28.0)
DRAWN BY: 24485
O2 Content: 1 L/min
O2 SAT: 95.7 %
PATIENT TEMPERATURE: 98.6
pCO2 arterial: 48.9 mmHg — ABNORMAL HIGH (ref 32.0–48.0)
pH, Arterial: 7.396 (ref 7.350–7.450)
pO2, Arterial: 80.5 mmHg — ABNORMAL LOW (ref 83.0–108.0)

## 2016-10-27 LAB — AMMONIA: AMMONIA: 21 umol/L (ref 9–35)

## 2016-10-27 MED ORDER — OXYCODONE-ACETAMINOPHEN 7.5-325 MG PO TABS
1.0000 | ORAL_TABLET | Freq: Three times a day (TID) | ORAL | Status: DC | PRN
  Administered 2016-10-28 (×2): 1 via ORAL
  Filled 2016-10-27 (×2): qty 1

## 2016-10-27 MED ORDER — METHOCARBAMOL 500 MG PO TABS
500.0000 mg | ORAL_TABLET | Freq: Three times a day (TID) | ORAL | Status: DC | PRN
  Administered 2016-10-27 – 2016-10-30 (×5): 500 mg via ORAL
  Filled 2016-10-27 (×5): qty 1

## 2016-10-27 NOTE — Progress Notes (Signed)
Occupational Therapy Treatment Patient Details Name: Diana Oneill MRN: [id number] DOB: 1955-07-26 Today's Date: 10/27/2016    History of present illness Pt admitted for elective L TKA. Pt with no PMH or PSH on file.  Dealing now with postoperative hypotension with AKI, encephalopathy with solemance and hyponatremia.   OT comments  Pt very lethargic this session and presenting with impaired cognition. Pt able to perform functional mobility with min assist +2 for safety and complete grooming tasks x2 standing at the sink with min guard assist. HR up to 140 bpm in standing; SpO2 down to 86% on RA, high 90s on 1L. Recommending SNF for follow up. Will continue to follow acutely.   Follow Up Recommendations  SNF;Supervision/Assistance - 24 hour    Equipment Recommendations  3 in 1 bedside commode    Recommendations for Other Services      Precautions / Restrictions Precautions Precautions: Knee Precaution Booklet Issued: No Restrictions Weight Bearing Restrictions: Yes LLE Weight Bearing: Weight bearing as tolerated       Mobility Bed Mobility Overal bed mobility: Needs Assistance Bed Mobility: Supine to Sit     Supine to sit: Min guard;HOB elevated     General bed mobility comments: cues for sequencing and initiation. significant increased time required. HOB elevated  Transfers Overall transfer level: Needs assistance Equipment used: Rolling walker (2 wheeled) Transfers: Sit to/from Stand Sit to Stand: Min assist;+2 physical assistance;+2 safety/equipment;From elevated surface         General transfer comment: increased time, cues for hand placement and initiation    Balance Overall balance assessment: Needs assistance Sitting-balance support: Feet supported Sitting balance-Leahy Scale: Fair     Standing balance support: No upper extremity supported;During functional activity Standing balance-Leahy Scale: Fair                              ADL either performed or assessed with clinical judgement   ADL Overall ADL's : Needs assistance/impaired     Grooming: Min guard;Standing;Oral care;Wash/dry face Grooming Details (indicate cue type and reason): increased time required. cues for initiation         Upper Body Dressing : Minimal assistance;Sitting Upper Body Dressing Details (indicate cue type and reason): to don hospital gown     Toilet Transfer: Minimal assistance;+2 for safety/equipment;RW Toilet Transfer Details (indicate cue type and reason): simulated by sit to stand from EOB with functional mobility in room. +2 due to lethargy         Functional mobility during ADLs: Minimal assistance;+2 for safety/equipment;Rolling walker       Vision       Perception     Praxis      Cognition Arousal/Alertness: Lethargic Behavior During Therapy: Flat affect Overall Cognitive Status: Impaired/Different from baseline Area of Impairment: Memory;Problem solving;Following commands                     Memory: Decreased short-term memory Following Commands: Follows one step commands inconsistently;Follows one step commands with increased time     Problem Solving: Slow processing;Requires verbal cues General Comments: pt very lethargic. repeating herself frequently and difficulty recalling information from short term memory. following one step commands inconsistently.        Exercises     Shoulder Instructions       General Comments HR up to 140 in standing with grooming tasks. SpO2 down to 86% on RA; high 90s on 1L supplemental O2.  Pertinent Vitals/ Pain       Pain Assessment: Faces Faces Pain Scale: Hurts little more Pain Location: L knee Pain Descriptors / Indicators: Sore Pain Intervention(s): Monitored during session;Limited activity within patient's tolerance;Repositioned  Home Living                                          Prior Functioning/Environment               Frequency  Min 2X/week        Progress Toward Goals  OT Goals(current goals can now be found in the care plan section)  Progress towards OT goals: Not progressing toward goals - comment (due to lethargy)  Acute Rehab OT Goals Patient Stated Goal: brush my teeth OT Goal Formulation: With patient  Plan Discharge plan remains appropriate    Co-evaluation    PT/OT/SLP Co-Evaluation/Treatment: Yes Reason for Co-Treatment: For patient/therapist safety;To address functional/ADL transfers   OT goals addressed during session: ADL's and self-care      AM-PAC PT "6 Clicks" Daily Activity     Outcome Measure   Help from another person eating meals?: A Little Help from another person taking care of personal grooming?: A Little Help from another person toileting, which includes using toliet, bedpan, or urinal?: A Lot Help from another person bathing (including washing, rinsing, drying)?: A Lot Help from another person to put on and taking off regular upper body clothing?: A Little Help from another person to put on and taking off regular lower body clothing?: A Lot 6 Click Score: 15    End of Session Equipment Utilized During Treatment: Gait belt;Rolling walker;Oxygen  OT Visit Diagnosis: Unsteadiness on feet (R26.81);Other abnormalities of gait and mobility (R26.89);Muscle weakness (generalized) (M62.81);Pain Pain - Right/Left: Left Pain - part of body: Knee   Activity Tolerance Patient limited by lethargy   Patient Left in chair;with call bell/phone within reach (with PT)   Nurse Communication Mobility status;Other (comment) (pt lethargic, HR elevated in standing)        Time: 0934-1000 OT Time Calculation (min): 26 min  Charges: OT General Charges $OT Visit: 1 Procedure OT Treatments $Self Care/Home Management : 8-22 mins  Tylor Gambrill A. Ulice Brilliant, M.S., OTR/L Pager: [place] 10/27/2016, 10:08 AM

## 2016-10-27 NOTE — Care Management Important Message (Signed)
Important Message  Patient Details  Name: Diana Oneill MRN: [id number] Date of Birth: 1955/06/29   Medicare Important Message Given:  Yes    Nathen May 10/27/2016, 10:47 AM

## 2016-10-27 NOTE — Progress Notes (Addendum)
PT in working with pt. Pt up to chair, very lethargic and sleeping in between conversations. PT found pill with u17 in bed(identified as percocet 7.5/325mg . RN wasted with witness from [place] in sharps box.  Pt commented that son brought her a percocet from home and she took it yesterday afternoon without RN knowledge.

## 2016-10-27 NOTE — Clinical Social Work Note (Addendum)
Per documentation, patient is very lethargic today. Patient currently asleep in chair. CSW will provide bed offers when patient more alert.  Diana Oneill, CSW [phone]  2:30 pm CSW received message from MD and voicemail from [facility] at [place]. Plan is for patient to return home with outpatient PT. Unit RNCM notified.  CSW signing off.  Diana Oneill, [place]

## 2016-10-27 NOTE — Progress Notes (Signed)
   Subjective:  Patient reports pain as mild to moderate.  Denies N/V/CP/SOB. Medically improving. Slow progress with PT - mod assist with transfers, min assist with ambulation.   Objective:   VITALS:   Vitals:   10/26/16 1938 10/27/16 0025 10/27/16 0210 10/27/16 0753  BP: 97/77 100/70 (!) 99/43 100/66  Pulse: 100 90 92   Resp: 13 12 17    Temp: 97.6 F (36.4 C) 97.8 F (36.6 C) 97.6 F (36.4 C) 98.3 F (36.8 C)  TempSrc: Oral Oral Oral Oral  SpO2: 98% 93% 97%   Weight:  113 kg (249 lb 1.9 oz)    Height:  5\' 5"  (1.651 m)      NAD ABD soft Sensation intact distally Intact pulses distally Dorsiflexion/Plantar flexion intact Incision: dressing C/D/I Compartment soft   Lab Results  Component Value Date   WBC 4.2 10/26/2016   HGB 8.3 (L) 10/26/2016   HCT 25.6 (L) 10/26/2016   MCV 85.3 10/26/2016   PLT 112 (L) 10/26/2016   BMET    Component Value Date/Time   NA 135 10/27/2016 0250   K 4.8 10/27/2016 0250   CL 102 10/27/2016 0250   CO2 27 10/27/2016 0250   GLUCOSE 87 10/27/2016 0250   BUN 27 (H) 10/27/2016 0250   CREATININE 1.11 (H) 10/27/2016 0250   CALCIUM 8.3 (L) 10/27/2016 0250   GFRNONAA 53 (L) 10/27/2016 0250   GFRAA >60 10/27/2016 0250     Assessment/Plan: 4 Days Post-Op   Principal Problem:   Acute kidney injury (HCC) Active Problems:   Osteoarthritis of left knee   Hyponatremia   Anemia   Thrombocytopenia (HCC)   Acute encephalopathy   Essential hypertension   Obesity   Acidosis   Nausea without vomiting   Tachycardia   COPD (chronic obstructive pulmonary disease) (HCC)   Acute respiratory failure (HCC)   WBAT with walker DVT ppx: ASA, SCDs, TEDs PO pain control: PO pain control ARF: improving, per hospitalist team ABLA: asymptomatic, monitor PT/OT Dispo: medical care per hospitalist team, transfer to 5N ortho if ok with hospitalist, cont PT in house, will d/c home with outpatient PT when medically ready   Placido Hangartner, Horald Pollen 10/27/2016, 8:38 AM   Rod Can, MD Cell [phone]

## 2016-10-27 NOTE — Progress Notes (Addendum)
Physical Therapy Treatment Patient Details Name: Diana Oneill MRN: [id number] DOB: Jul 26, 1955 Today's Date: 10/27/2016    History of Present Illness Pt admitted for elective L TKA. Pt with no PMH or PSH on file.  Dealing now with postoperative hypotension with AKI, encephalopathy with solemance and hyponatremia.    PT Comments    Pt admitted with above diagnosis. Pt currently with functional limitations due to balance and endurance deficits with incr HR to 142 bpm with sats down in mid 80's on RA.  1LO2 placed on pt with sats 95% and >.  Nurse made aware of issues with HR and sats.  Nurse to notify MD.  Pt lethargic today as well having trouble attending to tasks.  Exercises and grooming performed but could not progress ambulation due to issues with VS.   Pt high risk for falls and needs continued therapy.  At times needs 3 person assist for safety. Will follow acutely as pt is able.  Pt will benefit from skilled PT to increase their independence and safety with mobility to allow discharge to the venue listed below.     Follow Up Recommendations  Discharge recommendations per ortho MD;Supervision/Assistance - 24 hour (Needs HHPT if pt does go home)     Equipment Recommendations  3in1 (PT)    Recommendations for Other Services       Precautions / Restrictions Precautions Precautions: Knee Precaution Booklet Issued: No Precaution Comments: pt very lethargic and unable to retain info. Restrictions Weight Bearing Restrictions: Yes LLE Weight Bearing: Weight bearing as tolerated    Mobility  Bed Mobility Overal bed mobility: Needs Assistance Bed Mobility: Supine to Sit     Supine to sit: Min guard;HOB elevated     General bed mobility comments: cues for sequencing and initiation. significant increased time required. HOB elevated  Transfers Overall transfer level: Needs assistance Equipment used: Rolling walker (2 wheeled) Transfers: Sit to/from Stand Sit to Stand:  Min assist;+2 physical assistance;+2 safety/equipment;From elevated surface         General transfer comment: increased time, cues for hand placement and initiation  Ambulation/Gait Ambulation/Gait assistance: Min assist;+2 safety/equipment Ambulation Distance (Feet): 15 Feet Assistive device: Rolling walker (2 wheeled) Gait Pattern/deviations: Shuffle;Trunk flexed;Decreased stride length;Step-to pattern Gait velocity: slow Gait velocity interpretation: Below normal speed for age/gender General Gait Details: Ambulated to sink and brushed her teeth and washed her face.  HR up to 142 bpm therefore had pt sit down.  Took 10 min for HR to decr to 113 bpm therefore decided to exercise LEs rest of session.  Nursing made aware of Incr HR.    Stairs            Wheelchair Mobility    Modified Rankin (Stroke Patients Only)       Balance Overall balance assessment: Needs assistance Sitting-balance support: Feet supported;No upper extremity supported Sitting balance-Leahy Scale: Fair     Standing balance support: During functional activity;Bilateral upper extremity supported;Single extremity supported Standing balance-Leahy Scale: Poor Standing balance comment: UE support for balance with pt leaning on sink when brushing teeth.  Could let go with one hand.                             Cognition Arousal/Alertness: Lethargic Behavior During Therapy: Flat affect Overall Cognitive Status: Impaired/Different from baseline Area of Impairment: Memory;Problem solving;Following commands                 Orientation  Level: Disoriented to;Situation   Memory: Decreased short-term memory Following Commands: Follows one step commands inconsistently;Follows one step commands with increased time     Problem Solving: Slow processing;Requires verbal cues General Comments: pt very lethargic. repeating herself frequently and difficulty recalling information from short term  memory. following one step commands inconsistently.      Exercises Total Joint Exercises Ankle Circles/Pumps: AROM;Both;10 reps;Supine Quad Sets: AROM;10 reps;Left;Supine (with max cues and assist due to lethargy ) Long Arc Quad: AROM;Left;10 reps;Seated Knee Flexion: AAROM;10 reps;Seated Goniometric ROM: 6-110 degrees    General Comments General comments (skin integrity, edema, etc.): HR up to 142 bpm sustained with grooming tasks.  SpO2 86% on RA therefore incr to 1LO2 to keep sats in high 90's.        Pertinent Vitals/Pain Pain Assessment: Faces Faces Pain Scale: Hurts little more Pain Location: L knee Pain Descriptors / Indicators: Sore Pain Intervention(s): Limited activity within patient's tolerance;Monitored during session;Repositioned    Home Living                      Prior Function            PT Goals (current goals can now be found in the care plan section) Acute Rehab PT Goals Patient Stated Goal: brush my teeth Progress towards PT goals: Progressing toward goals    Frequency    Min 7X/week      PT Plan Discharge plan needs to be updated    Co-evaluation PT/OT/SLP Co-Evaluation/Treatment: Yes Reason for Co-Treatment: For patient/therapist safety PT goals addressed during session: Mobility/safety with mobility OT goals addressed during session: ADL's and self-care      AM-PAC PT "6 Clicks" Daily Activity  Outcome Measure  Difficulty turning over in bed (including adjusting bedclothes, sheets and blankets)?: A Little Difficulty moving from lying on back to sitting on the side of the bed? : A Little Difficulty sitting down on and standing up from a chair with arms (e.g., wheelchair, bedside commode, etc,.)?: A Little Help needed moving to and from a bed to chair (including a wheelchair)?: A Lot Help needed walking in hospital room?: A Little Help needed climbing 3-5 steps with a railing? : Total 6 Click Score: 15    End of Session  Equipment Utilized During Treatment: Gait belt;Oxygen Activity Tolerance: Patient limited by fatigue;Patient limited by pain Patient left: with call bell/phone within reach;in chair Nurse Communication: Mobility status;Patient requests pain meds PT Visit Diagnosis: Pain;Difficulty in walking, not elsewhere classified (R26.2);Other symptoms and signs involving the nervous system (R29.898) Pain - Right/Left: Left Pain - part of body: Knee     Time: 8453-6468 PT Time Calculation (min) (ACUTE ONLY): 36 min  Charges:  $Gait Training: 8-22 mins                    G Codes:       [place] [phone] (pager)    Denice Paradise 10/27/2016, 11:00 AM

## 2016-10-27 NOTE — Consult Note (Addendum)
Medical Consultation   Diana Oneill  QQV:[id number]  DOB: 20-Sep-1955  DOA: 10/23/2016  PCP: Celedonio Miyamoto (Inactive)    Requesting physician: [facility] orthopedic surgery Dr. Rod Can  Reason for consultation: Can patient be transferred back to orthopedics floor  History of Present Illness: Diana Oneill is an 61 y.o.WF PMHx obesity S/P Gastric bypass 2009, HTN, COPD, Anxiety, OSA (noncompliant with CPAP), S/P Left total knee replacement on 6/25 secondary to osteoarthritis. [facility] hospitalists called to consult for hyponatremia and acute kidney injury.  Formation is obtained from the chart and the patient noting that information from patient may be unreliable he appears to be quite lethargic. She underwent total knee replacement 2 days ago. Yesterday she was found to have acute renal failure was provided with IV fluids and nephrotoxins were eliminated. In addition she was hypotensive with a systolic blood pressure of 88. She also received Narcan 2 for oversedation. Of note patient has a history of chronic pain and preoperative long-term narcotic use. Today blood pressure improved but little improvement in creatinine and sodium level noted to be 130.  Patient reports adequate pain control of the left knee. She also reports some nausea without vomiting "only nauseated after I eat". She reports "feeling tired". She denies headache chest pain palpitation shortness of breath. He denies abdominal pain dysuria hematuria frequency or urgency. She reports a history of intermittent lower extremity edema and notes that her urine output is usually down during these episodes. She states she's gone to urgent care in the past and a "gave me some medicine for the swelling". He states she "used to weigh 400 pounds and had gastric bypass surgery 2009". She also reports history of COPD and sleep apnea but does not use cpap.  6/29 sleepy but arousable A/O 4  when aroused, but immediately falls by to sleep when not engaged. Patient states has OSA but has been refusing to use her CPAP machine. NOTE patient admitted to RN that her husband had brought her additional Percocet administered/attempted to administer the medication. RN found a tablet of Percocet in the bedding this A.m.   Review of Systems:  Review of Systems  Constitutional: Positive for malaise/fatigue. Negative for chills, diaphoresis, fever and weight loss.  HENT: Negative.   Eyes: Negative.   Respiratory: Negative.   Cardiovascular: Negative.   Skin: Negative.   Neurological: Negative.  Negative for weakness.     Past Medical History: Past Medical History:  Diagnosis Date  . Acute kidney injury ([place])   . Anemia   . Anxiety   . Arthritis   . Cancer ([place])    skin cancer, uterine cancer  . COPD (chronic obstructive pulmonary disease) ([place])   . Depression   . Dyspnea    with exertion  . History of kidney stones   . Hypertension   . Hyponatremia   . Osteoarthritis   . Sleep apnea    does not use CPAP "Makes too much Noise"  . Thrombocytopenia ([place])   . Thrombocytopenia ([place]) 09/2016    Past Surgical History: Past Surgical History:  Procedure Laterality Date  . ABDOMINAL HYSTERECTOMY    . ACHILLES TENDON REPAIR Left   . GASTRIC BYPASS    . JOINT REPLACEMENT Right    knee replace  . KNEE ARTHROPLASTY Left 10/23/2016   Procedure: COMPUTER ASSISTED TOTAL KNEE ARTHROPLASTY;  Surgeon: Rod Can, MD;  Location: [place];  Service:  Orthopedics;  Laterality: Left;     Allergies:   Allergies  Allergen Reactions  . Penicillins Anaphylaxis    PATIENT HAD A PCN REACTION WITH IMMEDIATE RASH, FACIAL/TONGUE/THROAT SWELLING, SOB, OR LIGHTHEADEDNESS WITH HYPOTENSION:  #  #  #  YES  #  #  #   Has patient had a PCN reaction causing severe rash involving mucus membranes or skin necrosis: No Has patient had a PCN reaction that required hospitalization No Has patient had a  PCN reaction occurring within the last 10 years: No If all of the above answers are "NO", then may proceed with Cephalosporin use.   . Prozac [Fluoxetine Hcl] Hives and Swelling    SWELLING REACTION UNSPECIFIED      Social History:  reports that she quit smoking about 2 months ago. Her smoking use included Cigarettes. She has a 40.00 pack-year smoking history. She has never used smokeless tobacco. She reports that she uses drugs. She reports that she does not drink alcohol.   Family History: History reviewed. No pertinent family history.  Unacceptable: Noncontributory, unremarkable, or negative. Acceptable: Family history reviewed and not pertinent (If you reviewed it)   Procedures/Significant Events:  6/25 left TKA   VENTILATOR SETTINGS: CPAP   Cultures None    Antimicrobials: Anti-infectives    Start     Stop   10/23/16 1930  vancomycin (VANCOCIN) IVPB 1000 mg/200 mL premix     10/23/16 2039   10/23/16 0600  vancomycin (VANCOCIN) 1,500 mg in sodium chloride 0.9 % 500 mL IVPB     10/23/16 0918       Devices    LINES / TUBES:  None    Continuous Infusions: . methocarbamol (ROBAXIN)  IV    .  sodium bicarbonate (isotonic) infusion in sterile water 100 mL/hr at 10/27/16 0600     Physical Exam: Vitals:   10/27/16 1000 10/27/16 1131 10/27/16 1200 10/27/16 1500  BP:  (!) 106/50  (!) 109/54  Pulse: (!) 117     Resp: (!) 22 16 (!) 24   Temp:  98.8 F (37.1 C)  98.5 F (36.9 C)  TempSrc:  Oral  Oral  SpO2: 97%   96%  Weight:      Height:        General: sleepy but arousable A/O 4 when aroused, but immediately falls by to sleep when not engaged, No acute respiratory distress Eyes: negative scleral hemorrhage, negative anisocoria, negative icterus ENT: Negative Runny nose, negative gingival bleeding, Neck:  Negative scars, masses, torticollis, lymphadenopathy, JVD Lungs: Clear to auscultation bilaterally without wheezes or crackles Cardiovascular:  Regular rate and rhythm without murmur gallop or rub normal S1 and S2 Abdomen: Morbidly obese, negative abdominal pain, nondistended, positive soft, bowel sounds, no rebound, no ascites, no appreciable mass Extremities: No significant cyanosis, clubbing. Difficult to assess edema secondary to obesity. LLE Ace bandage per surgery Skin: Negative rashes, lesions, ulcers Psychiatric:  Negative depression, negative anxiety, negative fatigue, negative mania  Central nervous system:  Cranial nerves II through XII intact, tongue/uvula midline, all extremities muscle strength 5/5, sensation intact throughout, negative dysarthria, negative expressive aphasia, negative receptive aphasia.  Data reviewed:  I have personally reviewed following labs and imaging studies Labs:  CBC:  Recent Labs Lab 10/24/16 0618 10/25/16 0507 10/26/16 0503  WBC 8.2 5.8 4.2  HGB 9.9* 8.9* 8.3*  HCT 31.3* 27.0* 25.6*  MCV 86.5 86.3 85.3  PLT 155 114* 112*    Basic Metabolic Panel:  Recent Labs Lab 10/24/16  4097 10/25/16 0507 10/25/16 1453 10/26/16 0503 10/27/16 0250  NA 132* 130* 129* 134* 135  K 5.0 5.0 5.2* 5.0 4.8  CL 104 105 105 107 102  CO2 18* 17* 17* 20* 27  GLUCOSE 165* 106* 94 89 87  BUN 30* 35* 36* 35* 27*  CREATININE 2.40* 2.01* 2.23* 1.78* 1.11*  CALCIUM 8.3* 7.7* 7.9* 8.0* 8.3*   GFR Estimated Creatinine Clearance: 67.6 mL/min (A) (by C-G formula based on SCr of 1.11 mg/dL (H)). Liver Function Tests: No results for input(s): AST, ALT, ALKPHOS, BILITOT, PROT, ALBUMIN in the last 168 hours. No results for input(s): LIPASE, AMYLASE in the last 168 hours. No results for input(s): AMMONIA in the last 168 hours. Coagulation profile No results for input(s): INR, PROTIME in the last 168 hours.  Cardiac Enzymes: No results for input(s): CKTOTAL, CKMB, CKMBINDEX, TROPONINI in the last 168 hours. BNP: Invalid input(s): POCBNP CBG: No results for input(s): GLUCAP in the last 168  hours. D-Dimer No results for input(s): DDIMER in the last 72 hours. Hgb A1c No results for input(s): HGBA1C in the last 72 hours. Lipid Profile No results for input(s): CHOL, HDL, LDLCALC, TRIG, CHOLHDL, LDLDIRECT in the last 72 hours. Thyroid function studies  Recent Labs  10/25/16 0858  TSH 2.001   Anemia work up  Recent Labs  10/25/16 0858  VITAMINB12 634  FOLATE 13.4  FERRITIN 28  TIBC 343  IRON 22*  RETICCTPCT 2.0   Urinalysis No results found for: COLORURINE, APPEARANCEUR, LABSPEC, PHURINE, GLUCOSEU, HGBUR, BILIRUBINUR, KETONESUR, PROTEINUR, UROBILINOGEN, NITRITE, [place]   Microbiology No results found for this or any previous visit (from the past 240 hour(s)).     Inpatient Medications:   Scheduled Meds: . aspirin  81 mg Oral BID  . citalopram  40 mg Oral Daily  . divalproex  500 mg Oral BID  . docusate sodium  100 mg Oral BID  . gabapentin  200 mg Oral BID  . lurasidone  120 mg Oral QHS  . senna  2 tablet Oral QHS   Continuous Infusions: . methocarbamol (ROBAXIN)  IV    .  sodium bicarbonate (isotonic) infusion in sterile water 100 mL/hr at 10/27/16 0600     Radiological Exams on Admission: Dg Chest Port 1 View  Result Date: 10/25/2016 CLINICAL DATA:  61 year old female with hypoxia. Medical history includes hypertension, COPD and dyspnea EXAM: PORTABLE CHEST 1 VIEW COMPARISON:  Prior chest x-ray 10/25/2016 FINDINGS: Stable borderline cardiomegaly. Mild chronic vascular congestion without overt edema, no interval change compared to prior. No focal airspace consolidation, pleural effusion, pneumothorax or atelectasis. Osseous structures are intact and unremarkable for age. IMPRESSION: Stable chest x-ray without acute cardiopulmonary process. Stable mild cardiomegaly and low inspiratory volumes. Electronically Signed   By: Jacqulynn Cadet M.D.   On: 10/25/2016 19:00    Impression/Recommendations Principal Problem:   Acute kidney injury  [facility]) Active Problems:   Osteoarthritis of left knee   Hyponatremia   Anemia   Thrombocytopenia ([facility])   Acute encephalopathy   Essential hypertension   Obesity   Acidosis   Nausea without vomiting   Tachycardia   COPD (chronic obstructive pulmonary disease) (H[facility])   Acute respiratory failure (H[facility])   Acute kidney injury(Preoperative creatinine 1.18).  -Likely related to hypotension in the setting of ACE inhibitor.  -Hold all nephrotoxic medication Lab Results  Component Value Date   CREATININE 1.11 (H) 10/27/2016   CREATININE 1.78 (H) 10/26/2016   CREATININE 2.23 (H) 10/25/2016  -Baseline -Decreased sodium  bicarbonate to 50 ml/hr -ABG pending    Hyponatremia. -Resolved  Acute encephalopathy.  -Possibly multifactorial OSA not on CPAP, iatrogenic (husband bringing narcotics from home), liver function?  -Will decrease patient's Robaxin, Percocet in the light of husband bringing pain medication from home. -DC Benadryl -Nursing staff to closely observe husband when present as he appears to be sneaking patient narcotics. If husband is found to be bringing in narcotic medication, contact [place] police and have husband escorted off campus in band from hospital. -Once she is quite alert will revisit and adjust medications as needed -Hold on transferring patient back to surgical floor  Essential Hypertension.  -Controlled without medication monitor closely   Acidosis.  -ABG pending   Anemia.(Preop hemoglobin 12.4) - Likely related to acute blood loss and perhaps some dilution. Hemoglobin this morning 8.9. . No signs symptoms of active bleeding -Obtain an anemia panel Recent Labs Lab 10/24/16 0618 10/25/16 0507 10/26/16 0503  HGB 9.9* 8.9* 8.3*   Thrombocytopenia.  -Monitor closely -Negative sign of overt bleeding  Osteoarthritis of knee.  -S/P left TKA -Care per orthopedic surgery   Obesity.  -Strict in and out -Daily weight -Weight 247 pounds. Patient is  status post gastric bypass surgery 2009  COPD -Titrate O2 to maintain SPO2 89 and 93%  -scheduled nebs x 3 doses  OSA/OHS -CPAP -Patient will require sleep study upon discharge in order to obtain new CPAP machine   Thank you for this consultation.  Our [facility] hospitalist team will follow the patient with you.   Time Spent: 40 minutes  WOODS, Geraldo Docker M.D. [facility] 10/27/2016, 5:10 PM

## 2016-10-28 ENCOUNTER — Inpatient Hospital Stay (HOSPITAL_COMMUNITY): Payer: Medicare Other

## 2016-10-28 DIAGNOSIS — F1110 Opioid abuse, uncomplicated: Secondary | ICD-10-CM

## 2016-10-28 LAB — HEMOGLOBIN AND HEMATOCRIT, BLOOD
HCT: 24.7 % — ABNORMAL LOW (ref 36.0–46.0)
Hemoglobin: 8.1 g/dL — ABNORMAL LOW (ref 12.0–15.0)

## 2016-10-28 MED ORDER — IOPAMIDOL (ISOVUE-370) INJECTION 76%
INTRAVENOUS | Status: AC
Start: 2016-10-28 — End: 2016-10-28
  Administered 2016-10-28: 100 mL via INTRAVENOUS
  Filled 2016-10-28: qty 100

## 2016-10-28 NOTE — Progress Notes (Signed)
Received patient to room 10 from [place]; report received from [place], Therapist, sports. ; patient brought to floor via bed, room air, accompanied by SWOT nurse and nursing tech. Patient is awake, no signs of distress.  `

## 2016-10-28 NOTE — Progress Notes (Signed)
Continued on monitor, called tele. NSR.

## 2016-10-28 NOTE — Progress Notes (Signed)
Physical Therapy Treatment Patient Details Name: Diana Oneill MRN: [id number] DOB: 1955/07/22 Today's Date: 10/28/2016    History of Present Illness Pt admitted for elective L TKA. Pt with no PMH or PSH on file.  Dealing now with postoperative hypotension with AKI, encephalopathy with solemance and hyponatremia.    PT Comments    Pt with some improvement in alertness and mobility this session. Significant decreased assistance required this session for gait and transfers. Pt does, however, continue to be lethargic this session and requesting pain medication with reports of pain at 10/10. Pt will benefit from continued acute follow-up to progress gait and transfer training prior to discharge to MD recommended venue.     Follow Up Recommendations  DC plan and follow up therapy as arranged by surgeon;Supervision/Assistance - 24 hour     Equipment Recommendations  3in1 (PT)    Recommendations for Other Services       Precautions / Restrictions Precautions Precautions: Knee Precaution Booklet Issued: No Precaution Comments: pt very lethargic and unable to retain info. Restrictions Weight Bearing Restrictions: Yes LLE Weight Bearing: Weight bearing as tolerated    Mobility  Bed Mobility Overal bed mobility: Needs Assistance Bed Mobility: Supine to Sit     Supine to sit: Min guard;HOB elevated     General bed mobility comments: cues for sequencing and initiation. significant increased time required. HOB elevated  Transfers Overall transfer level: Needs assistance Equipment used: Rolling walker (2 wheeled) Transfers: Sit to/from Stand Sit to Stand: Min assist;+2 safety/equipment         General transfer comment: Min a from EOB, improved sequencing and decreased cues required this session to stand  Ambulation/Gait Ambulation/Gait assistance: Min assist;+2 safety/equipment Ambulation Distance (Feet): 20 Feet Assistive device: Rolling walker (2 wheeled) Gait  Pattern/deviations: Shuffle;Trunk flexed;Decreased stride length;Step-to pattern Gait velocity: slow Gait velocity interpretation: Below normal speed for age/gender General Gait Details: Gait x 20' with cues for sequencing, staying within walker during gait and for direction. Pt continues to require assistance for safe mobility.    Stairs            Wheelchair Mobility    Modified Rankin (Stroke Patients Only)       Balance Overall balance assessment: Needs assistance Sitting-balance support: Feet supported;No upper extremity supported Sitting balance-Leahy Scale: Fair     Standing balance support: During functional activity;Bilateral upper extremity supported;Single extremity supported Standing balance-Leahy Scale: Poor Standing balance comment: reliant on UE's for support in standing                            Cognition Arousal/Alertness: Lethargic Behavior During Therapy: Flat affect Overall Cognitive Status: No family/caregiver present to determine baseline cognitive functioning Area of Impairment: Memory;Problem solving;Following commands                 Orientation Level: Disoriented to;Situation   Memory: Decreased short-term memory Following Commands: Follows one step commands inconsistently;Follows one step commands with increased time     Problem Solving: Slow processing;Requires verbal cues General Comments: pt continues to be lethargic and has diffuclty following commands. seems  improved compared to previous sessions      Exercises Total Joint Exercises Ankle Circles/Pumps: AROM;Both;10 reps;Supine Quad Sets: AROM;10 reps;Left;Supine Heel Slides: AAROM;Left;10 reps;Supine    General Comments        Pertinent Vitals/Pain Pain Assessment: 0-10 Pain Score: 9  Faces Pain Scale: Hurts little more Pain Location: L knee Pain Descriptors /  Indicators: Aching;Grimacing;Guarding;Sore Pain Intervention(s): Monitored during  session;Premedicated before session;Repositioned;Patient requesting pain meds-RN notified;Ice applied    Home Living                      Prior Function            PT Goals (current goals can now be found in the care plan section) Acute Rehab PT Goals Patient Stated Goal: to get some pain medications Progress towards PT goals: Progressing toward goals    Frequency    7X/week      PT Plan Current plan remains appropriate    Co-evaluation              AM-PAC PT "6 Clicks" Daily Activity  Outcome Measure  Difficulty turning over in bed (including adjusting bedclothes, sheets and blankets)?: A Little Difficulty moving from lying on back to sitting on the side of the bed? : A Little Difficulty sitting down on and standing up from a chair with arms (e.g., wheelchair, bedside commode, etc,.)?: Total Help needed moving to and from a bed to chair (including a wheelchair)?: A Lot Help needed walking in hospital room?: A Lot Help needed climbing 3-5 steps with a railing? : A Lot 6 Click Score: 13    End of Session Equipment Utilized During Treatment: Gait belt Activity Tolerance: Patient limited by fatigue;Patient limited by pain Patient left: in chair;with call bell/phone within reach;with chair alarm set Nurse Communication: Mobility status;Patient requests pain meds PT Visit Diagnosis: Pain;Difficulty in walking, not elsewhere classified (R26.2);Other symptoms and signs involving the nervous system (R29.898) Pain - Right/Left: Left Pain - part of body: Knee     Time: 0812-0836 PT Time Calculation (min) (ACUTE ONLY): 24 min  Charges:  $Gait Training: 8-22 mins $Therapeutic Exercise: 8-22 mins                    G Codes:       Scheryl Marten PT, DPT  [phone]     Jacqulyn Liner Sloan Leiter 10/28/2016, 9:11 AM

## 2016-10-28 NOTE — Progress Notes (Signed)
Report given to RN, pt transferred on room air in bed.

## 2016-10-28 NOTE — Progress Notes (Signed)
Pt given percocet at 10am for pain per order. Pt's spouse at bedside and reminded that pt is not to be given any medication from outside the hospital or by anyone other that the nurse. He stated he understood.  Pt very lethargic after spouse left. She will wake to voice and answer questions but falls back asleep. MD aware. Diana Pandy RN

## 2016-10-28 NOTE — Progress Notes (Signed)
Orthopedics Progress Note  Subjective: Patient very sleepy this morning but when aroused states she is not having any chest pain or shortness of breath.  She is looking forward to getting back on the ortho floor.  Objective:  Vitals:   10/27/16 2325 10/28/16 0309  BP: 108/64 111/65  Pulse: 84 86  Resp: 10 12  Temp: 98 F (36.7 C) 98.6 F (37 C)    General: sleepy this morning and pale appearing Musculoskeletal: left knee aquacel dressing in place and moderate swelling, no cords and no pain with AROM of the knee and ankle Neurovascularly intact  Lab Results  Component Value Date   WBC 4.2 10/26/2016   HGB 8.3 (L) 10/26/2016   HCT 25.6 (L) 10/26/2016   MCV 85.3 10/26/2016   PLT 112 (L) 10/26/2016       Component Value Date/Time   NA 136 10/27/2016 1857   K 4.9 10/27/2016 1857   CL 102 10/27/2016 1857   CO2 28 10/27/2016 1857   GLUCOSE 119 (H) 10/27/2016 1857   BUN 24 (H) 10/27/2016 1857   CREATININE 1.00 10/27/2016 1857   CALCIUM 8.7 (L) 10/27/2016 1857   GFRNONAA 60 (L) 10/27/2016 1857   GFRAA >60 10/27/2016 1857    No results found for: INR, PROTIME  Assessment/Plan: POD #3 s/p Procedure(s): COMPUTER ASSISTED TOTAL KNEE ARTHROPLASTY Patient with significant sedation this morning despite very little pain medication.  CT scan ordered by Dr Lyla Glassing is pending.  I will repeat a H and H this morning to check her Hgb.  Acute blood loss anemia from surgery. Will follow up on CT but will keep her in step down until we have that result.  May need to give transfusion. Will need to hold pain meds for sedation.  Doran Heater. Veverly Fells, MD 10/28/2016 11:25 AM

## 2016-10-28 NOTE — Consult Note (Addendum)
Medical Consultation   [name]  JSE:[id number]  DOB: [birth year]  DOA: 10/23/2016  PCP: Celedonio Miyamoto (Inactive)    Requesting physician: [facility] orthopedic surgery Dr. Rod Can  Reason for consultation: Can patient be transferred back to orthopedics floor  History of Present Illness: Diana Oneill is an 61 y.o.WF PMHx obesity S/P Gastric bypass 2009, HTN, COPD, Anxiety, OSA (noncompliant with CPAP), S/P Left total knee replacement on 6/25 secondary to osteoarthritis. Morning [facility] hospitalists called to consult for hyponatremia and acute kidney injury.  Formation is obtained from the chart and the patient noting that information from patient may be unreliable he appears to be quite lethargic. She underwent total knee replacement 2 days ago. Yesterday she was found to have acute renal failure was provided with IV fluids and nephrotoxins were eliminated. In addition she was hypotensive with a systolic blood pressure of 88. She also received Narcan 2 for oversedation. Of note patient has a history of chronic pain and preoperative long-term narcotic use. Today blood pressure improved but little improvement in creatinine and sodium level noted to be 130.  Patient reports adequate pain control of the left knee. She also reports some nausea without vomiting "only nauseated after I eat". She reports "feeling tired". She denies headache chest pain palpitation shortness of breath. He denies abdominal pain dysuria hematuria frequency or urgency. She reports a history of intermittent lower extremity edema and notes that her urine output is usually down during these episodes. She states she's gone to urgent care in the past and a "gave me some medicine for the swelling". He states she "used to weigh 400 pounds and had gastric bypass surgery 2009". She also reports history of COPD and sleep apnea but does not use cpap.  6/29 A/O 4 admits that her son was  bringing her narcotics (Percocet) from outside. States her husband was not bringing her narcotics. NOTE: However RN staff noted that immediately after husband's visit this a.m. patient became extremely sleepy, difficult to arouse, somewhat loopy. CURRENTLY negative CP, negative SOB, negative N/V     Review of Systems:  Review of Systems  Constitutional: Positive for malaise/fatigue. Negative for chills, diaphoresis, fever and weight loss.  HENT: Negative.   Eyes: Negative.   Respiratory: Negative.   Cardiovascular: Negative.   Skin: Negative.   Neurological: Negative.  Negative for weakness.     Past Medical History: Past Medical History:  Diagnosis Date  . Acute kidney injury ([place])   . Anemia   . Anxiety   . Arthritis   . Cancer ([place])    skin cancer, uterine cancer  . COPD (chronic obstructive pulmonary disease) ([place])   . Depression   . Dyspnea    with exertion  . History of kidney stones   . Hypertension   . Hyponatremia   . Osteoarthritis   . Sleep apnea    does not use CPAP "Makes too much Noise"  . Thrombocytopenia ([place])   . Thrombocytopenia ([place]) 09/2016    Past Surgical History: Past Surgical History:  Procedure Laterality Date  . ABDOMINAL HYSTERECTOMY    . ACHILLES TENDON REPAIR Left   . GASTRIC BYPASS    . JOINT REPLACEMENT Right    knee replace  . KNEE ARTHROPLASTY Left 10/23/2016   Procedure: COMPUTER ASSISTED TOTAL KNEE ARTHROPLASTY;  Surgeon: Rod Can, MD;  Location: [facility];  Service: Orthopedics;  Laterality: Left;  Allergies:   Allergies  Allergen Reactions  . Penicillins Anaphylaxis    PATIENT HAD A PCN REACTION WITH IMMEDIATE RASH, FACIAL/TONGUE/THROAT SWELLING, SOB, OR LIGHTHEADEDNESS WITH HYPOTENSION:  #  #  #  YES  #  #  #   Has patient had a PCN reaction causing severe rash involving mucus membranes or skin necrosis: No Has patient had a PCN reaction that required hospitalization No Has patient had a PCN reaction occurring  within the last 10 years: No If all of the above answers are "NO", then may proceed with Cephalosporin use.   . Prozac [Fluoxetine Hcl] Hives and Swelling    SWELLING REACTION UNSPECIFIED      Social History:  reports that she quit smoking about 2 months ago. Her smoking use included Cigarettes. She has a 40.00 pack-year smoking history. She has never used smokeless tobacco. She reports that she uses drugs. She reports that she does not drink alcohol.   Family History: History reviewed. No pertinent family history.  Unacceptable: Noncontributory, unremarkable, or negative. Acceptable: Family history reviewed and not pertinent (If you reviewed it)   Procedures/Significant Events:  6/25 left TKA   VENTILATOR SETTINGS: CPAP   Cultures None    Antimicrobials: Anti-infectives    Start     Stop   10/23/16 1930  vancomycin (VANCOCIN) IVPB 1000 mg/200 mL premix     10/23/16 2039   10/23/16 0600  vancomycin (VANCOCIN) 1,500 mg in sodium chloride 0.9 % 500 mL IVPB     10/23/16 0918       Devices    LINES / TUBES:  None    Continuous Infusions: .  sodium bicarbonate (isotonic) infusion in sterile water 100 mL/hr at 10/27/16 0600     Physical Exam: Vitals:   10/27/16 1500 10/27/16 1917 10/27/16 2325 10/28/16 0309  BP: (!) 109/54 109/77 108/64 111/65  Pulse: 98 85 84 86  Resp: (!) 7 10 10 12   Temp: 98.5 F (36.9 C) 97.8 F (36.6 C) 98 F (36.7 C) 98.6 F (37 C)  TempSrc: Oral Oral Oral Axillary  SpO2: 96% 99% 100% 99%  Weight:      Height:        General: A/O 4 No acute respiratory distress Eyes: negative scleral hemorrhage, negative anisocoria, negative icterus ENT: Negative Runny nose, negative gingival bleeding, Neck:  Negative scars, masses, torticollis, lymphadenopathy, JVD Lungs: Clear to auscultation bilaterally without wheezes or crackles Cardiovascular: Regular rate and rhythm without murmur gallop or rub normal S1 and S2 Abdomen: Morbidly  obese, negative abdominal pain, nondistended, positive soft, bowel sounds, no rebound, no ascites, no appreciable mass Extremities: No significant cyanosis, clubbing. Difficult to assess edema secondary to obesity. LLE Ace bandage per surgery Skin: Negative rashes, lesions, ulcers Psychiatric:  Negative depression, negative anxiety, negative fatigue, negative mania  Central nervous system:  Cranial nerves II through XII intact, tongue/uvula midline, all extremities muscle strength 5/5, sensation intact throughout, negative dysarthria, negative expressive aphasia, negative receptive aphasia.  Data reviewed:  I have personally reviewed following labs and imaging studies Labs:  CBC:  Recent Labs Lab 10/24/16 0618 10/25/16 0507 10/26/16 0503  WBC 8.2 5.8 4.2  HGB 9.9* 8.9* 8.3*  HCT 31.3* 27.0* 25.6*  MCV 86.5 86.3 85.3  PLT 155 114* 112*    Basic Metabolic Panel:  Recent Labs Lab 10/25/16 0507 10/25/16 1453 10/26/16 0503 10/27/16 0250 10/27/16 1857  NA 130* 129* 134* 135 136  K 5.0 5.2* 5.0 4.8 4.9  CL 105 105  107 102 102  CO2 17* 17* 20* 27 28  GLUCOSE 106* 94 89 87 119*  BUN 35* 36* 35* 27* 24*  CREATININE 2.01* 2.23* 1.78* 1.11* 1.00  CALCIUM 7.7* 7.9* 8.0* 8.3* 8.7*   GFR Estimated Creatinine Clearance: 75 mL/min (by C-G formula based on SCr of 1 mg/dL). Liver Function Tests:  Recent Labs Lab 10/27/16 1857  AST 17  ALT 12*  ALKPHOS 42  BILITOT 0.6  PROT 4.7*  ALBUMIN 2.4*   No results for input(s): LIPASE, AMYLASE in the last 168 hours.  Recent Labs Lab 10/27/16 1857  AMMONIA 21   Coagulation profile No results for input(s): INR, PROTIME in the last 168 hours.  Cardiac Enzymes: No results for input(s): CKTOTAL, CKMB, CKMBINDEX, TROPONINI in the last 168 hours. BNP: Invalid input(s): POCBNP CBG: No results for input(s): GLUCAP in the last 168 hours. D-Dimer No results for input(s): DDIMER in the last 72 hours. Hgb A1c No results for input(s):  HGBA1C in the last 72 hours. Lipid Profile No results for input(s): CHOL, HDL, LDLCALC, TRIG, CHOLHDL, LDLDIRECT in the last 72 hours. Thyroid function studies  Recent Labs  10/25/16 0858  TSH 2.001   Anemia work up  Recent Labs  10/25/16 0858  VITAMINB12 634  FOLATE 13.4  FERRITIN 28  TIBC 343  IRON 22*  RETICCTPCT 2.0   Urinalysis No results found for: COLORURINE, APPEARANCEUR, LABSPEC, PHURINE, GLUCOSEU, HGBUR, BILIRUBINUR, KETONESUR, PROTEINUR, UROBILINOGEN, NITRITE, [place]   Microbiology No results found for this or any previous visit (from the past 240 hour(s)).     Inpatient Medications:   Scheduled Meds: . aspirin  81 mg Oral BID  . citalopram  40 mg Oral Daily  . divalproex  500 mg Oral BID  . docusate sodium  100 mg Oral BID  . gabapentin  200 mg Oral BID  . lurasidone  120 mg Oral QHS  . senna  2 tablet Oral QHS   Continuous Infusions: .  sodium bicarbonate (isotonic) infusion in sterile water 100 mL/hr at 10/27/16 0600     Radiological Exams on Admission: No results found.  Impression/Recommendations Principal Problem:   Acute kidney injury ([place]) Active Problems:   Osteoarthritis of left knee   Hyponatremia   Anemia   Thrombocytopenia ([facility])   Acute encephalopathy   Essential hypertension   Obesity   Acidosis   Nausea without vomiting   Tachycardia   COPD (chronic obstructive pulmonary disease) (H[facility])   Acute respiratory failure (H[facility])   Acute kidney injury(Preoperative creatinine 1.18).  -Likely related to hypotension in the setting of ACE inhibitor.  -Hold all nephrotoxic medication Lab Results  Component Value Date   CREATININE 1.00 10/27/2016   CREATININE 1.11 (H) 10/27/2016   CREATININE 1.78 (H) 10/26/2016  -Baseline -Decreased sodium bicarbonate to 50 ml/hr -ABG pending    Hyponatremia. -Resolved  Acute encephalopathy.  -Possibly multifactorial OSA not on CPAP, iatrogenic (husband bringing narcotics from home),  liver function?  -Will decrease patient's Robaxin. -DC Percocet in the light of son and possibly husband bringing unknown quantity/strength of narcotic medication from home. -DC Benadryl -Nursing staff to closely observe husband when present as he appears to be sneaking patient narcotics. If husband is found to be bringing in narcotic medication, contact [place] police and have husband escorted off campus and band from hospital. -Once she is quite alert will revisit and adjust medications as needed -Hold on transferring patient back to surgical floor  Narcotic abuse -Patient having family members bring outside  narcotics into the hospital. -Counseled patient at length concerning the danger of having her son or husband brain outside medication into the hospital, to include altered mental status and even death. -Counseled patient that if son or husband suspected of bringing narcotics into the hospital again [facility] police would be contacted and they would be arrested and band from hospital.  Essential Hypertension.  -Controlled without medication monitor closely   Acidosis.  -Resolved   Anemia.(Preop hemoglobin 12.4) - Likely related to acute blood loss and perhaps some dilution. Hemoglobin this morning 8.9. . No signs symptoms of active bleeding -Obtain an anemia panel  Recent Labs Lab 10/24/16 0618 10/25/16 0507 10/26/16 0503  HGB 9.9* 8.9* 8.3*   Thrombocytopenia.  -Monitor closely -Negative sign of overt bleeding  Osteoarthritis of knee.  -S/P left TKA -Care per orthopedic surgery   Obesity.  -Strict in and out -Daily weight -Weight 247 pounds. Patient is status post gastric bypass surgery 2009  COPD -Titrate O2 to maintain SPO2 89 and 93%  -scheduled nebs x 3 doses  OSA/OHS -CPAP -Patient will require sleep study upon discharge in order to obtain new CPAP machine   Thank you for this consultation.  [facility] hospitalist team will follow the patient with  you.   Time Spent: 40 minutes  Brighten Orndoff, Geraldo Docker M.D. [facility] 10/28/2016, 8:03 AM

## 2016-10-29 DIAGNOSIS — M170 Bilateral primary osteoarthritis of knee: Secondary | ICD-10-CM

## 2016-10-29 LAB — BASIC METABOLIC PANEL
ANION GAP: 5 (ref 5–15)
BUN: 13 mg/dL (ref 6–20)
CALCIUM: 8.4 mg/dL — AB (ref 8.9–10.3)
CO2: 36 mmol/L — AB (ref 22–32)
Chloride: 94 mmol/L — ABNORMAL LOW (ref 101–111)
Creatinine, Ser: 0.86 mg/dL (ref 0.44–1.00)
Glucose, Bld: 83 mg/dL (ref 65–99)
Potassium: 4.5 mmol/L (ref 3.5–5.1)
Sodium: 135 mmol/L (ref 135–145)

## 2016-10-29 LAB — PREPARE RBC (CROSSMATCH)

## 2016-10-29 LAB — HEMOGLOBIN AND HEMATOCRIT, BLOOD
HCT: 31.2 % — ABNORMAL LOW (ref 36.0–46.0)
Hemoglobin: 10.2 g/dL — ABNORMAL LOW (ref 12.0–15.0)

## 2016-10-29 MED ORDER — SODIUM CHLORIDE 0.9 % IV SOLN: Freq: Once | INTRAVENOUS | Status: DC

## 2016-10-29 MED ORDER — FERROUS SULFATE 325 (65 FE) MG PO TABS
325.0000 mg | ORAL_TABLET | Freq: Three times a day (TID) | ORAL | Status: DC
  Administered 2016-10-29 – 2016-10-31 (×8): 325 mg via ORAL
  Filled 2016-10-29 (×7): qty 1

## 2016-10-29 NOTE — Consult Note (Signed)
Medical Consultation   Diana Oneill  HDQ:[id number]  DOB: [birth year]  DOA: 10/23/2016  PCP: Celedonio Miyamoto (Inactive)    Requesting physician: [facility] orthopedic surgery Dr. Rod Can  Reason for consultation: Can patient be transferred back to orthopedics floor  History of Present Illness: Diana Oneill is an 61 y.o.WF PMHx obesity S/P Gastric bypass 2009, HTN, COPD, Anxiety, OSA (noncompliant with CPAP), S/P Left total knee replacement on 6/25 secondary to osteoarthritis. [facility]ists called to consult for hyponatremia and acute kidney injury.  Formation is obtained from the chart and the patient noting that information from patient may be unreliable he appears to be quite lethargic. She underwent total knee replacement 2 days ago. Yesterday she was found to have acute renal failure was provided with IV fluids and nephrotoxins were eliminated. In addition she was hypotensive with a systolic blood pressure of 88. She also received Narcan 2 for oversedation. Of note patient has a history of chronic pain and preoperative long-term narcotic use. Today blood pressure improved but little improvement in creatinine and sodium level noted to be 130.  Patient reports adequate pain control of the left knee. She also reports some nausea without vomiting "only nauseated after I eat". She reports "feeling tired". She denies headache chest pain palpitation shortness of breath. He denies abdominal pain dysuria hematuria frequency or urgency. She reports a history of intermittent lower extremity edema and notes that her urine output is usually down during these episodes. She states she's gone to urgent care in the past and a "gave me some medicine for the swelling". He states she "used to weigh 400 pounds and had gastric bypass surgery 2009". She also reports history of COPD and sleep apnea but does not use cpap.  6/30  again patient very  lethargic/sleepy arousable but will fall asleep in mid sentence. Per RN Earlie Server this began after her husband visited her again today (providing narcotics?). Patient unable to participate with physical therapy. After patient is aroused from sleep will request additional narcotics. Negative CP, negative SOB, negative N/V     Review of Systems:  Review of Systems  Constitutional: Positive for malaise/fatigue. Negative for chills, diaphoresis, fever and weight loss.  HENT: Negative.   Eyes: Negative.   Respiratory: Negative.   Cardiovascular: Negative.   Skin: Negative.   Neurological: Negative.  Negative for weakness.     Past Medical History: Past Medical History:  Diagnosis Date  . Acute kidney injury ([place])   . Anemia   . Anxiety   . Arthritis   . Cancer ([place])    skin cancer, uterine cancer  . COPD (chronic obstructive pulmonary disease) ([place])   . Depression   . Dyspnea    with exertion  . History of kidney stones   . Hypertension   . Hyponatremia   . Osteoarthritis   . Sleep apnea    does not use CPAP "Makes too much Noise"  . Thrombocytopenia ([place])   . Thrombocytopenia ([place]) 09/2016    Past Surgical History: Past Surgical History:  Procedure Laterality Date  . ABDOMINAL HYSTERECTOMY    . ACHILLES TENDON REPAIR Left   . GASTRIC BYPASS    . JOINT REPLACEMENT Right    knee replace  . KNEE ARTHROPLASTY Left 10/23/2016   Procedure: COMPUTER ASSISTED TOTAL KNEE ARTHROPLASTY;  Surgeon: Rod Can, MD;  Location: [place];  Service: Orthopedics;  Laterality: Left;  Allergies:   Allergies  Allergen Reactions  . Penicillins Anaphylaxis    PATIENT HAD A PCN REACTION WITH IMMEDIATE RASH, FACIAL/TONGUE/THROAT SWELLING, SOB, OR LIGHTHEADEDNESS WITH HYPOTENSION:  #  #  #  YES  #  #  #   Has patient had a PCN reaction causing severe rash involving mucus membranes or skin necrosis: No Has patient had a PCN reaction that required hospitalization No Has patient had a PCN  reaction occurring within the last 10 years: No If all of the above answers are "NO", then may proceed with Cephalosporin use.   . Prozac [Fluoxetine Hcl] Hives and Swelling    SWELLING REACTION UNSPECIFIED      Social History:  reports that she quit smoking about 2 months ago. Her smoking use included Cigarettes. She has a 40.00 pack-year smoking history. She has never used smokeless tobacco. She reports that she uses drugs. She reports that she does not drink alcohol.   Family History: History reviewed. No pertinent family history.  Unacceptable: Noncontributory, unremarkable, or negative. Acceptable: Family history reviewed and not pertinent (If you reviewed it)   Procedures/Significant Events:  6/25 left TKA   VENTILATOR SETTINGS: CPAP   Cultures None    Antimicrobials: Anti-infectives    Start     Stop   10/23/16 1930  vancomycin (VANCOCIN) IVPB 1000 mg/200 mL premix     10/23/16 2039   10/23/16 0600  vancomycin (VANCOCIN) 1,500 mg in sodium chloride 0.9 % 500 mL IVPB     10/23/16 0918       Devices    LINES / TUBES:  None    Continuous Infusions: . sodium chloride    .  sodium bicarbonate (isotonic) infusion in sterile water 100 mL/hr at 10/29/16 0455     Physical Exam: Vitals:   10/28/16 2056 10/29/16 0537 10/29/16 1115 10/29/16 1134  BP: 135/62 107/62 (!) 123/104 125/78  Pulse: (!) 104 70 66 70  Resp:   18 16  Temp: 98.2 F (36.8 C) 98.4 F (36.9 C) 98.1 F (36.7 C) 97.9 F (36.6 C)  TempSrc: Oral Oral Oral Oral  SpO2: 97% 97% 92% 94%  Weight:      Height:        General: again patient very lethargic/sleepy arousable but will fall asleep in mid sentence, No acute respiratory distress Eyes: negative scleral hemorrhage, negative anisocoria, negative icterus ENT: Negative Runny nose, negative gingival bleeding, Neck:  Negative scars, masses, torticollis, lymphadenopathy, JVD Lungs: Clear to auscultation bilaterally without wheezes or  crackles Cardiovascular: Regular rate and rhythm without murmur gallop or rub normal S1 and S2 Abdomen: Morbidly obese, negative abdominal pain, nondistended, positive soft, bowel sounds, no rebound, no ascites, no appreciable mass Extremities: No significant cyanosis, clubbing. Difficult to assess edema secondary to obesity. Bilateral lower extremity with TED hose on, negative sign of infection under LLE bandages.  Skin: Negative rashes, lesions, ulcers Psychiatric:  Negative depression, negative anxiety, negative fatigue, negative mania  Central nervous system:  Cranial nerves II through XII intact, tongue/uvula midline, all extremities muscle strength 5/5, sensation intact throughout, negative dysarthria, negative expressive aphasia, negative receptive aphasia.  Data reviewed:  I have personally reviewed following labs and imaging studies Labs:  CBC:  Recent Labs Lab 10/24/16 0618 10/25/16 0507 10/26/16 0503 10/28/16 1201  WBC 8.2 5.8 4.2  --   HGB 9.9* 8.9* 8.3* 8.1*  HCT 31.3* 27.0* 25.6* 24.7*  MCV 86.5 86.3 85.3  --   PLT 155 114* 112*  --  Basic Metabolic Panel:  Recent Labs Lab 10/25/16 1453 10/26/16 0503 10/27/16 0250 10/27/16 1857 10/29/16 0407  NA 129* 134* 135 136 135  K 5.2* 5.0 4.8 4.9 4.5  CL 105 107 102 102 94*  CO2 17* 20* 27 28 36*  GLUCOSE 94 89 87 119* 83  BUN 36* 35* 27* 24* 13  CREATININE 2.23* 1.78* 1.11* 1.00 0.86  CALCIUM 7.9* 8.0* 8.3* 8.7* 8.4*   GFR Estimated Creatinine Clearance: 87.2 mL/min (by C-G formula based on SCr of 0.86 mg/dL). Liver Function Tests:  Recent Labs Lab 10/27/16 1857  AST 17  ALT 12*  ALKPHOS 42  BILITOT 0.6  PROT 4.7*  ALBUMIN 2.4*   No results for input(s): LIPASE, AMYLASE in the last 168 hours.  Recent Labs Lab 10/27/16 1857  AMMONIA 21   Coagulation profile No results for input(s): INR, PROTIME in the last 168 hours.  Cardiac Enzymes: No results for input(s): CKTOTAL, CKMB, CKMBINDEX,  TROPONINI in the last 168 hours. BNP: Invalid input(s): POCBNP CBG: No results for input(s): GLUCAP in the last 168 hours. D-Dimer No results for input(s): DDIMER in the last 72 hours. Hgb A1c No results for input(s): HGBA1C in the last 72 hours. Lipid Profile No results for input(s): CHOL, HDL, LDLCALC, TRIG, CHOLHDL, LDLDIRECT in the last 72 hours. Thyroid function studies No results for input(s): TSH, T4TOTAL, T3FREE, THYROIDAB in the last 72 hours.  Invalid input(s): FREET3 Anemia work up No results for input(s): VITAMINB12, FOLATE, FERRITIN, TIBC, IRON, RETICCTPCT in the last 72 hours. Urinalysis No results found for: COLORURINE, APPEARANCEUR, LABSPEC, [place], GLUCOSEU, HGBUR, BILIRUBINUR, KETONESUR, PROTEINUR, UROBILINOGEN, NITRITE, [place]   Microbiology No results found for this or any previous visit (from the past 240 hour(s)).     Inpatient Medications:   Scheduled Meds: . aspirin  81 mg Oral BID  . citalopram  40 mg Oral Daily  . divalproex  500 mg Oral BID  . docusate sodium  100 mg Oral BID  . ferrous sulfate  325 mg Oral TID WC  . gabapentin  200 mg Oral BID  . lurasidone  120 mg Oral QHS  . senna  2 tablet Oral QHS   Continuous Infusions: . sodium chloride    .  sodium bicarbonate (isotonic) infusion in sterile water 100 mL/hr at 10/29/16 0455     Radiological Exams on Admission: Ct Angio Chest Pe W Or Wo Contrast  Result Date: 10/28/2016 CLINICAL DATA:  Hypoxemia EXAM: CT ANGIOGRAPHY CHEST WITH CONTRAST TECHNIQUE: Multidetector CT imaging of the chest was performed using the standard protocol during bolus administration of intravenous contrast. Multiplanar CT image reconstructions and MIPs were obtained to evaluate the vascular anatomy. CONTRAST:  100 cc Isovue 370 IV COMPARISON:  10/25/2016 FINDINGS: Cardiovascular: Heart is mildly enlarged. Slight dilatation of the ascending thoracic aorta, 4.1 cm. No filling defects in the pulmonary arteries to  suggest pulmonary emboli. Mediastinum/Nodes: No mediastinal, hilar, or axillary adenopathy. Lungs/Pleura: Small bilateral pleural effusions. Patchy ground-glass opacities noted anteriorly in the lingula and right upper lobe. No confluent airspace opacities. Upper Abdomen: Imaging into the upper abdomen shows no acute findings. Musculoskeletal: Chest wall soft tissues are unremarkable. No acute bony abnormality. Review of the MIP images confirms the above findings. IMPRESSION: No evidence of pulmonary embolus. Trace bilateral pleural effusions. Scattered ground-glass opacities anteriorly in the right upper lobe and lingula could reflect areas of alveolitis/ small airways disease. 4.1 cm ascending thoracic aortic aneurysm. Recommend annual imaging followup by CTA or MRA. This  recommendation follows 2010 ACCF/AHA/AATS/ACR/ASA/SCA/SCAI/SIR/STS/SVM Guidelines for the Diagnosis and Management of Patients with Thoracic Aortic Disease. Circulation. 2010; 121: J191-Y782 Electronically Signed   By: Rolm Baptise M.D.   On: 10/28/2016 17:37    Impression/Recommendations Principal Problem:   Acute kidney injury [facility]) Active Problems:   Osteoarthritis of left knee   Hyponatremia   Anemia   Thrombocytopenia (H[facility])   Acute encephalopathy   Essential hypertension   Obesity   Acidosis   Nausea without vomiting   Tachycardia   COPD (chronic obstructive pulmonary disease) (H[facility])   Acute respiratory failure (H[facility])   Acute kidney injury(Preoperative creatinine 1.18).  -Likely related to hypotension in the setting of ACE inhibitor.  -Hold all nephrotoxic medication Lab Results  Component Value Date   CREATININE 0.86 10/29/2016   CREATININE 1.00 10/27/2016   CREATININE 1.11 (H) 10/27/2016  -Baseline -7/1D/Ced Sodium bicarbonate    Hyponatremia. -Resolved  Acute encephalopathy.  -Possibly multifactorial OSA not on CPAP, iatrogenic (husband bringing narcotics from home), liver function?  -DCed Percocet in  the light of son and possibly husband bringing unknown quantity/strength of narcotic medication from home. -DC Benadryl -Nursing staff to closely observe husband when present as he appears to be sneaking patient narcotics. If husband is found to be bringing in narcotic medication, contact [place] police and have husband escorted off campus and band from hospital. -Patient at high risk for overdose   Narcotic abuse -Patient having family members bring outside narcotics into the hospital. -Counseled patient at length concerning the danger of having her son or husband brain outside medication into the hospital, to include altered mental status and even death. -Counseled patient that if son or husband suspected of bringing narcotics into the hospital again [facility] police would be contacted and they would be arrested and band from hospital.  Essential Hypertension.  -Controlled without medication monitor closely   Acidosis.  -Resolved   Anemia.(Preop hemoglobin 12.4) - Likely related to acute blood loss and perhaps some dilution. Hemoglobin this morning 8.9. . No signs symptoms of active bleeding -Obtain an anemia panel Recent Labs Lab 10/24/16 0618 10/25/16 0507 10/26/16 0503 10/28/16 1201  HGB 9.9* 8.9* 8.3* 8.1*  -7/1 Per surgery transfuse 2 units PRBC  Thrombocytopenia.  -Monitor closely -Negative sign of overt bleeding  Osteoarthritis of knee.  -S/P left TKA -Care per orthopedic surgery   Obesity.  -Strict in and out since admission +6.5 L -Daily weight Filed Weights   10/23/16 0626 10/27/16 0025  Weight: 247 lb (112 kg) 249 lb 1.9 oz (113 kg)  -Weight 247 pounds. Patient is status post gastric bypass surgery 2009  COPD -Titrate O2 to maintain SPO2 89 and 93%   OSA/OHS -CPAP -Patient will require sleep study upon discharge in order to obtain new CPAP machine   Thank you for this consultation.  Our [facility] hospitalist team will sign off, please reconsult if  required.   Time Spent: 40 minutes  WOODS, Geraldo Docker M.D. [facility] 10/29/2016, 3:55 PM

## 2016-10-29 NOTE — Progress Notes (Signed)
    Subjective: 6 Days Post-Op Procedure(s) (LRB): COMPUTER ASSISTED TOTAL KNEE ARTHROPLASTY (Left) Patient reports pain as 8 on 0-10 scale.   Denies CP or SOB.  Voiding without difficulty. Positive flatus. Objective: Vital signs in last 24 hours: Temp:  [97.8 F (36.6 C)-98.4 F (36.9 C)] 98.4 F (36.9 C) (07/01 0537) Pulse Rate:  [68-104] 70 (07/01 0537) Resp:  [11-15] 15 (06/30 1801) BP: (104-135)/(57-74) 107/62 (07/01 0537) SpO2:  [92 %-100 %] 97 % (07/01 0537)  Intake/Output from previous day: 06/30 0701 - 07/01 0700 In: 3980 [P.O.:480; I.V.:3500] Out: 2950 [Urine:2950] Intake/Output this shift: No intake/output data recorded.  Labs:  Recent Labs  10/28/16 1201  HGB 8.1*    Recent Labs  10/28/16 1201  HCT 24.7*    Recent Labs  10/27/16 1857 10/29/16 0407  NA 136 135  K 4.9 4.5  CL 102 94*  CO2 28 36*  BUN 24* 13  CREATININE 1.00 0.86  GLUCOSE 119* 83  CALCIUM 8.7* 8.4*   No results for input(s): LABPT, INR in the last 72 hours.  Physical Exam: Neurologically intact Intact pulses distally Incision: dressing C/D/I Compartment soft  HCT 24.7.  (trending down from 6/27 - 27) Will transfuse 2 units and start Fe supplements  Assessment/Plan: 6 Days Post-Op Procedure(s) (LRB): COMPUTER ASSISTED TOTAL KNEE ARTHROPLASTY (Left) Up with therapy  Patient reports significant pain Concern given history of sedation with little pain medications Will check   Tevin Shillingford D for Dr. Melina Schools [facility] [phone] 10/29/2016, 7:54 AM    \

## 2016-10-29 NOTE — Progress Notes (Signed)
OT Cancellation Note  Patient Details Name: Diana Oneill MRN: [id number] DOB: Apr 12, 1956   Cancelled Treatment:    Reason Eval/Treat Not Completed: Patient at procedure or test/ unavailable.  Pt. Receiving 2 units of blood.  Will check back as able.    Janice Coffin, COTA/L 10/29/2016, 11:47 AM

## 2016-10-29 NOTE — Progress Notes (Addendum)
Physical Therapy Treatment Patient Details Name: Diana Oneill MRN: [id number] DOB: 13-Apr-1956 Today's Date: 10/29/2016    History of Present Illness Pt admitted for elective L TKA. Pt with no PMH or PSH on file.  Dealing now with postoperative hypotension with AKI, encephalopathy with solemance and hyponatremia.    PT Comments    Pt performed treatment but remains limited due to lethargy.  Pt unable to progress gait further at this time.  Pt is at a high risk of falling in her current functional status.  Will continue PT per POC as patient is able to progress.  Plan remains per surgeon.  Pt is not safe to d/c home today as her function remains severely limited.   Follow Up Recommendations  DC plan and follow up therapy as arranged by surgeon;Supervision/Assistance - 24 hour     Equipment Recommendations  3in1 (PT)    Recommendations for Other Services       Precautions / Restrictions Precautions Precautions: Knee Precaution Booklet Issued: No Precaution Comments: pt very lethargic and unable to retain info. Restrictions Weight Bearing Restrictions: Yes LLE Weight Bearing: Weight bearing as tolerated    Mobility  Bed Mobility Overal bed mobility: Needs Assistance Bed Mobility: Supine to Sit     Supine to sit: Min assist     General bed mobility comments: cues for sequencing and initiation. significant increased time required. HOB elevated.  Pt required assist to scoot to edge of bed and advance LLE.    Transfers Overall transfer level: Needs assistance Equipment used: Rolling walker (2 wheeled) Transfers: Sit to/from Stand Sit to Stand: Min assist;Mod assist Stand pivot transfers: Min assist       General transfer comment: Pt lethargic edge of bed with difficulty keeping her eyes open.  Pt require mod assistance and only able to achieve partial sit to stand, bed height elevated and successful transfer completed with min assistance.     Ambulation/Gait Ambulation/Gait assistance: Min assist Ambulation Distance (Feet): 15 Feet Assistive device: Rolling walker (2 wheeled) Gait Pattern/deviations: Shuffle;Trunk flexed;Decreased stride length;Step-to pattern Gait velocity: slow Gait velocity interpretation: Below normal speed for age/gender General Gait Details: Pt remains lethargic with shuffling dragging pattern during session.  Cues for B foot clearance and upper trunk control.    Stairs Stairs:  (not safe to attempt at this time.  )          Wheelchair Mobility    Modified Rankin (Stroke Patients Only)       Balance Overall balance assessment: Needs assistance Sitting-balance support: Feet supported;No upper extremity supported Sitting balance-Leahy Scale: Fair     Standing balance support: During functional activity;Bilateral upper extremity supported;Single extremity supported Standing balance-Leahy Scale: Poor                              Cognition Arousal/Alertness: Lethargic;Suspect due to medications Behavior During Therapy: Flat affect Overall Cognitive Status: No family/caregiver present to determine baseline cognitive functioning Area of Impairment: Memory;Problem solving;Following commands                 Orientation Level: Disoriented to;Situation   Memory: Decreased short-term memory Following Commands: Follows one step commands inconsistently;Follows one step commands with increased time     Problem Solving: Slow processing;Requires verbal cues General Comments: pt continues to be lethargic and has diffuclty following commands. Pt intermittently falling asleep during session.        Exercises Total Joint Exercises Ankle  Circles/Pumps: AROM;Both;10 reps;Supine Quad Sets:  (Attempted quad sets but patient unable to follow commands for isometrric movement.  ) Long Arc Quad: Left;10 reps;Seated;AAROM    General Comments        Pertinent Vitals/Pain Pain  Assessment: Faces Faces Pain Scale: No hurt (Pt falling asleep during session.  )    Home Living                      Prior Function            PT Goals (current goals can now be found in the care plan section) Acute Rehab PT Goals Patient Stated Goal: None stated, lethargic throughout session.   Potential to Achieve Goals: Fair Progress towards PT goals: Not progressing toward goals - comment (Pt remains limited due to lethargy.  )    Frequency    7X/week      PT Plan Current plan remains appropriate    Co-evaluation              AM-PAC PT "6 Clicks" Daily Activity  Outcome Measure  Difficulty turning over in bed (including adjusting bedclothes, sheets and blankets)?: Total Difficulty moving from lying on back to sitting on the side of the bed? : Total Difficulty sitting down on and standing up from a chair with arms (e.g., wheelchair, bedside commode, etc,.)?: Total Help needed moving to and from a bed to chair (including a wheelchair)?: A Lot Help needed walking in hospital room?: A Lot Help needed climbing 3-5 steps with a railing? : Total 6 Click Score: 8    End of Session Equipment Utilized During Treatment: Gait belt Activity Tolerance: Patient limited by fatigue;Patient limited by pain Patient left: in chair;with call bell/phone within reach;with chair alarm set Nurse Communication: Mobility status;Patient requests pain meds PT Visit Diagnosis: Pain;Difficulty in walking, not elsewhere classified (R26.2);Other symptoms and signs involving the nervous system (R29.898) Pain - Right/Left: Left Pain - part of body: Knee     Time: 9295-7473 PT Time Calculation (min) (ACUTE ONLY): 28 min  Charges:  $Gait Training: 8-22 mins $Therapeutic Activity: 8-22 mins                    G Codes:       Governor Rooks, PTA pager [phone]    Cristela Blue 10/29/2016, 4:01 PM

## 2016-10-30 LAB — BASIC METABOLIC PANEL
Anion gap: 6 (ref 5–15)
BUN: 13 mg/dL (ref 6–20)
CHLORIDE: 95 mmol/L — AB (ref 101–111)
CO2: 31 mmol/L (ref 22–32)
CREATININE: 0.81 mg/dL (ref 0.44–1.00)
Calcium: 8.2 mg/dL — ABNORMAL LOW (ref 8.9–10.3)
GFR calc Af Amer: >60 mL/min (ref >60)
GFR calc non Af Amer: >60 mL/min (ref >60)
GLUCOSE: 85 mg/dL (ref 65–99)
Potassium: 4.3 mmol/L (ref 3.5–5.1)
Sodium: 132 mmol/L — ABNORMAL LOW (ref 135–145)

## 2016-10-30 LAB — TYPE AND SCREEN
ABO/RH(D): B POS
ANTIBODY SCREEN: NEGATIVE
UNIT DIVISION: 0
Unit division: 0

## 2016-10-30 LAB — BPAM RBC
Blood Product Expiration Date: 201807212359
Blood Product Expiration Date: 201807212359
ISSUE DATE / TIME: 201807011058
ISSUE DATE / TIME: 201807011545
UNIT TYPE AND RH: 7300
Unit Type and Rh: 7300

## 2016-10-30 MED ORDER — HYDROMORPHONE HCL 2 MG PO TABS
1.0000 mg | ORAL_TABLET | ORAL | Status: DC | PRN
  Administered 2016-10-30 – 2016-10-31 (×5): 2 mg via ORAL
  Filled 2016-10-30 (×5): qty 1

## 2016-10-30 NOTE — Progress Notes (Signed)
Physical Therapy Treatment Patient Details Name: Diana Oneill MRN: [id number] DOB: August 06, 1955 Today's Date: 10/30/2016    History of Present Illness Pt admitted for elective L TKA. Pt with no PMH or PSH on file.  Dealing now with postoperative hypotension with AKI, encephalopathy with solemance and hyponatremia.    PT Comments    Pt performed increased activity during session this pm.  Pt more alert and demonstrated better ability during session.  Pt resting in bed after increased gait and stair training.      Follow Up Recommendations  DC plan and follow up therapy as arranged by surgeon;Supervision/Assistance - 24 hour     Equipment Recommendations  3in1 (PT)    Recommendations for Other Services       Precautions / Restrictions Precautions Precautions: Knee Precaution Booklet Issued: No Precaution Comments: Pt alert on arrival but as tx progressed patient became more lethargic.   Restrictions Weight Bearing Restrictions: Yes LLE Weight Bearing: Weight bearing as tolerated    Mobility  Bed Mobility Overal bed mobility: Needs Assistance Bed Mobility: Supine to Sit     Supine to sit: Supervision Sit to supine: Min assist   General bed mobility comments: Min assist for LLE management back into bed.    Transfers Overall transfer level: Needs assistance Equipment used: Rolling walker (2 wheeled) Transfers: Sit to/from Stand Sit to Stand: Min guard         General transfer comment: Cues for hand placement to and from seated surface.  Pt required decreased assistance during session this pm.    Ambulation/Gait Ambulation/Gait assistance: Min guard Ambulation Distance (Feet): 160 Feet Assistive device: Rolling walker (2 wheeled) Gait Pattern/deviations: Trunk flexed;Decreased stride length;Step-to pattern Gait velocity: slow Gait velocity interpretation: Below normal speed for age/gender General Gait Details: Cues for upper trunk control, cues for RW  safety as she is pushing too far forward during gait training.     Stairs Stairs: Yes   Stair Management: One rail Right Number of Stairs: 6 General stair comments: cues for sequence, assist for safety.  Pt slow and guarded but no LOB.    Wheelchair Mobility    Modified Rankin (Stroke Patients Only)       Balance Overall balance assessment: Needs assistance Sitting-balance support: Feet supported;No upper extremity supported Sitting balance-Leahy Scale: Good Sitting balance - Comments: able to sit edge of bed unassisted.     Standing balance support: During functional activity;Bilateral upper extremity supported;Single extremity supported Standing balance-Leahy Scale: Fair Standing balance comment: Improved balance this pm.                              Cognition Arousal/Alertness: Awake/alert Behavior During Therapy: WFL for tasks assessed/performed Overall Cognitive Status: Within Functional Limits for tasks assessed Area of Impairment: Memory;Following commands                 Orientation Level: Disoriented to;Situation   Memory: Decreased short-term memory Following Commands: Follows one step commands inconsistently;Follows one step commands with increased time     Problem Solving: Slow processing;Requires verbal cues General Comments: Pt performed increased gait and more alert during session.        Exercises Total Joint Exercises Ankle Circles/Pumps: AROM;Both;10 reps;Supine Quad Sets:  (Max VCs for correct technique.  ) Heel Slides: AAROM;Left;10 reps;Supine Hip ABduction/ADduction: AAROM;Left;10 reps;Supine Straight Leg Raises: AAROM;Left;10 reps;Supine Long Arc Quad: Left;10 reps;Seated;AROM Goniometric ROM: 78 degrees flexion in L knee.  General Comments        Pertinent Vitals/Pain Pain Assessment: Faces Pain Score: 9  Faces Pain Scale: Hurts a little bit Pain Location: L knee Pain Descriptors / Indicators: Grimacing Pain  Intervention(s): Monitored during session;Repositioned    Home Living                      Prior Function            PT Goals (current goals can now be found in the care plan section) Acute Rehab PT Goals Patient Stated Goal: To go home Potential to Achieve Goals: Fair Progress towards PT goals: Progressing toward goals    Frequency    7X/week      PT Plan Current plan remains appropriate    Co-evaluation              AM-PAC PT "6 Clicks" Daily Activity  Outcome Measure  Difficulty turning over in bed (including adjusting bedclothes, sheets and blankets)?: Total Difficulty moving from lying on back to sitting on the side of the bed? : Total Difficulty sitting down on and standing up from a chair with arms (e.g., wheelchair, bedside commode, etc,.)?: Total Help needed moving to and from a bed to chair (including a wheelchair)?: A Little Help needed walking in hospital room?: A Little Help needed climbing 3-5 steps with a railing? : A Little 6 Click Score: 12    End of Session Equipment Utilized During Treatment: Gait belt Activity Tolerance: Patient limited by fatigue;Patient limited by pain Patient left: with call bell/phone within reach;in bed;with bed alarm set Nurse Communication: Mobility status PT Visit Diagnosis: Pain;Difficulty in walking, not elsewhere classified (R26.2);Other symptoms and signs involving the nervous system (R29.898) Pain - Right/Left: Left Pain - part of body: Knee     Time: 2671-2458 PT Time Calculation (min) (ACUTE ONLY): 33 min  Charges:  $Gait Training: 8-22 mins $Therapeutic Activity: 8-22 mins                    G Codes:       Governor Rooks, PTA pager [phone]    Cristela Blue 10/30/2016, 3:40 PM

## 2016-10-30 NOTE — Progress Notes (Signed)
Occupational Therapy Treatment Patient Details Name: Diana Oneill MRN: [id number] DOB: 1955/05/24 Today's Date: 10/30/2016    History of present illness Pt admitted for elective L TKA. Pt with no PMH or PSH on file.  Dealing now with postoperative hypotension with AKI, encephalopathy with solemance and hyponatremia.   OT comments  Pt progressing towards estbalished goals. Provided education on shower transfer with 3N1. Pt performed transfer with Min A, Mod VCs to sequence, and significant amount of time. Continue to feel pt would benefit from short term stay at SNF for further OT to increase safety prior to dc home. Will continue to follow acutely to facilitate safe dc.    Follow Up Recommendations  DC plan and follow up therapy as arranged by surgeon;SNF;Supervision/Assistance - 24 hour    Equipment Recommendations  3 in 1 bedside commode    Recommendations for Other Services PT consult    Precautions / Restrictions Precautions Precautions: Knee Precaution Booklet Issued: No Precaution Comments: Pt alert on arrival but as tx progressed patient became more lethargic.   Restrictions Weight Bearing Restrictions: Yes LLE Weight Bearing: Weight bearing as tolerated       Mobility Bed Mobility Overal bed mobility: Needs Assistance Bed Mobility: Supine to Sit       Sit to supine: Mod assist   General bed mobility comments: Mod A for BLE management  Transfers Overall transfer level: Needs assistance Equipment used: Rolling walker (2 wheeled) Transfers: Sit to/from Stand Sit to Stand: Mod assist         General transfer comment: Pt required mod assist to stand with cues for hand placement, pushing from seated surface, forward weight shifting and hip/knee/and trunk extension.  Required multiple attempts to stand with weight shifting forward.     Balance Overall balance assessment: Needs assistance Sitting-balance support: Feet supported;No upper extremity  supported Sitting balance-Leahy Scale: Good Sitting balance - Comments: Able to perform LB ADLs   Standing balance support: During functional activity;Bilateral upper extremity supported;Single extremity supported Standing balance-Leahy Scale: Poor Standing balance comment: reliant on UE's for support in standing                           ADL either performed or assessed with clinical judgement   ADL Overall ADL's : Needs assistance/impaired             Lower Body Bathing: Min guard                   Tub/ Shower Transfer: Walk-in shower;Minimal assistance;Ambulation;3 in 1;Rolling walker;Cueing for safety;Cueing for sequencing Tub/Shower Transfer Details (indicate cue type and reason): Provided education on shower transfer with 3N1. Pt requiring Min A thorughout for safety and Mod VCs to sequence task Functional mobility during ADLs: Minimal assistance;Rolling walker General ADL Comments: Pt continues to have difficulty staying awake during session. Would close her eyes during conversation or instructions and would require VCs to attend.      Vision       Perception     Praxis      Cognition Arousal/Alertness: Lethargic;Suspect due to medications (Pt intially alert but as tx progress patient became increasingly lethargic.  ) Behavior During Therapy: WFL for tasks assessed/performed;Flat affect Overall Cognitive Status: No family/caregiver present to determine baseline cognitive functioning Area of Impairment: Memory;Following commands                 Orientation Level: Disoriented to;Situation   Memory: Decreased short-term  memory Following Commands: Follows one step commands inconsistently;Follows one step commands with increased time     Problem Solving: Slow processing;Requires verbal cues General Comments: Pt able to follow commands for shower transfer training. Required increase VCs and increased time to process. Demonstrating increased  porblem solving to talk about home set up. However, would perseverate on certain topics stating "I am trying to understand."        Exercises    Shoulder Instructions       General Comments      Pertinent Vitals/ Pain       Pain Assessment: Faces Faces Pain Scale: Hurts a little bit Pain Location: L knee Pain Descriptors / Indicators: Grimacing Pain Intervention(s): Monitored during session;Repositioned;Limited activity within patient's tolerance  Home Living                                          Prior Functioning/Environment              Frequency  Min 2X/week        Progress Toward Goals  OT Goals(current goals can now be found in the care plan section)  Progress towards OT goals: Progressing toward goals  Acute Rehab OT Goals Patient Stated Goal: To go home OT Goal Formulation: With patient Time For Goal Achievement: 11/07/16 Potential to Achieve Goals: Good ADL Goals Pt Will Perform Grooming: with set-up;with supervision;standing Pt Will Perform Lower Body Dressing: with set-up;with supervision;sit to/from stand Pt Will Transfer to Toilet: with set-up;with supervision;bedside commode;ambulating Pt Will Perform Toileting - Clothing Manipulation and hygiene: with set-up;with supervision;sit to/from stand Pt Will Perform Tub/Shower Transfer: Shower transfer;3 in 1;ambulating;with min guard assist;rolling walker  Plan Discharge plan remains appropriate    Co-evaluation                 AM-PAC PT "6 Clicks" Daily Activity     Outcome Measure   Help from another person eating meals?: A Little Help from another person taking care of personal grooming?: A Little Help from another person toileting, which includes using toliet, bedpan, or urinal?: A Little Help from another person bathing (including washing, rinsing, drying)?: A Lot Help from another person to put on and taking off regular upper body clothing?: A Little Help from  another person to put on and taking off regular lower body clothing?: A Lot 6 Click Score: 16    End of Session Equipment Utilized During Treatment: Gait belt;Rolling walker  OT Visit Diagnosis: Unsteadiness on feet (R26.81);Other abnormalities of gait and mobility (R26.89);Muscle weakness (generalized) (M62.81);Pain Pain - Right/Left: Left Pain - part of body: Knee   Activity Tolerance Patient limited by lethargy;Patient tolerated treatment well   Patient Left with call bell/phone within reach;in bed   Nurse Communication Mobility status;Other (comment) (pt lethargic)        Time: 8938-1017 OT Time Calculation (min): 25 min  Charges: OT General Charges $OT Visit: 1 Procedure OT Treatments $Self Care/Home Management : 23-37 mins  [place], OTR/L Acute Rehab Pager: [phone] Office: [place] 10/30/2016, 2:42 PM

## 2016-10-30 NOTE — Progress Notes (Signed)
   Subjective:  Patient reports pain as mild to moderate.  Denies N/V/CP/SOB. Patient more awake today - c/o left knee pain.  Objective:   VITALS:   Vitals:   10/29/16 1935 10/29/16 2007 10/30/16 0625 10/30/16 1419  BP: (!) 148/86 135/82 (!) 144/77 128/61  Pulse: 63 64 63 64  Resp: 20   18  Temp: 97.2 F (36.2 C) 97.6 F (36.4 C) 98.2 F (36.8 C) 97.6 F (36.4 C)  TempSrc: Oral Oral Oral Oral  SpO2: 97% 99% 95% 100%  Weight:      Height:        NAD ABD soft Sensation intact distally Intact pulses distally Dorsiflexion/Plantar flexion intact Incision: dressing C/D/I Compartment soft   Lab Results  Component Value Date   WBC 4.2 10/26/2016   HGB 10.2 (L) 10/29/2016   HCT 31.2 (L) 10/29/2016   MCV 85.3 10/26/2016   PLT 112 (L) 10/26/2016   BMET    Component Value Date/Time   NA 132 (L) 10/30/2016 0427   K 4.3 10/30/2016 0427   CL 95 (L) 10/30/2016 0427   CO2 31 10/30/2016 0427   GLUCOSE 85 10/30/2016 0427   BUN 13 10/30/2016 0427   CREATININE 0.81 10/30/2016 0427   CALCIUM 8.2 (L) 10/30/2016 0427   GFRNONAA >60 10/30/2016 0427   GFRAA >60 10/30/2016 0427     Assessment/Plan: 7 Days Post-Op   Principal Problem:   Acute kidney injury (HCC) Active Problems:   Osteoarthritis of left knee   Hyponatremia   Anemia   Thrombocytopenia (HCC)   Acute encephalopathy   Essential hypertension   Obesity   Acidosis   Nausea without vomiting   Tachycardia   COPD (chronic obstructive pulmonary disease) (HCC)   Acute respiratory failure (HCC)   WBAT with walker DVT ppx: ASA, SCDs, TEDs PO pain control: start dilaudid, low dose due to risk of over sedation ARF: resolved PT/OT Dispo:  Work on pain control, cont PT in house, will d/c home with outpatient PT likely tomorrow   Medon, Horald Pollen 10/30/2016, 6:03 PM   Rod Can, MD Cell [phone]

## 2016-10-30 NOTE — Progress Notes (Signed)
Physical Therapy Treatment Patient Details Name: Diana Oneill MRN: [id number] DOB: 01-03-1956 Today's Date: 10/30/2016    History of Present Illness Pt admitted for elective L TKA. Pt with no PMH or PSH on file.  Dealing now with postoperative hypotension with AKI, encephalopathy with solemance and hyponatremia.    PT Comments    Pt performed increased gait and more alert initially on arrival.  Pt became more drowsy and lethargic during session.   Pt will continue to benefit from rehab during hospitalization she continues to progress slower than anticipated.  Will f/u in pm for stair training pending patient presentation.    Follow Up Recommendations  DC plan and follow up therapy as arranged by surgeon;Supervision/Assistance - 24 hour     Equipment Recommendations  3in1 (PT)    Recommendations for Other Services       Precautions / Restrictions Precautions Precautions: Knee Precaution Booklet Issued: No Precaution Comments: Pt alert on arrival but as tx progressed patient became more lethargic.   Restrictions Weight Bearing Restrictions: Yes LLE Weight Bearing: Weight bearing as tolerated    Mobility  Bed Mobility               General bed mobility comments: Pt in recliner on arrival.    Transfers Overall transfer level: Needs assistance Equipment used: Rolling walker (2 wheeled) Transfers: Sit to/from Stand Sit to Stand: Mod assist         General transfer comment: Pt required mod assist to stand with cues for hand placement, pushing from seated surface, forward weight shifting and hip/knee/and trunk extension.  Pt demonstrated good eccentric loading when returning to seated surface.  Cues for forward advancement of LL when returning to seated position.    Ambulation/Gait Ambulation/Gait assistance: Min assist Ambulation Distance (Feet): 80 Feet Assistive device: Rolling walker (2 wheeled) Gait Pattern/deviations: Trunk flexed;Decreased stride  length;Step-to pattern Gait velocity: slow Gait velocity interpretation: Below normal speed for age/gender General Gait Details: Cues for upper trunk control, cues for RW safety as she is pushing too far forward during gait training.     Stairs Stairs:  (Will try stair training this afternoon pending patient presentation.  )          Wheelchair Mobility    Modified Rankin (Stroke Patients Only)       Balance     Sitting balance-Leahy Scale: Good       Standing balance-Leahy Scale: Poor Standing balance comment: reliant on UE's for support in standing                            Cognition Arousal/Alertness: Lethargic;Suspect due to medications (Pt intially alert but as tx progress patient became increasingly lethargic.  ) Behavior During Therapy: Flat affect Overall Cognitive Status: No family/caregiver present to determine baseline cognitive functioning Area of Impairment: Memory;Problem solving;Following commands                 Orientation Level: Disoriented to;Situation   Memory: Decreased short-term memory Following Commands: Follows one step commands inconsistently;Follows one step commands with increased time     Problem Solving: Slow processing;Requires verbal cues General Comments: Pt able to follow commands for gait training but half way through she became lethargic  with delayed responses to commands.  Pt intermittently falling asleep during exercises.        Exercises Total Joint Exercises Ankle Circles/Pumps: AROM;Both;10 reps;Supine Quad Sets: AAROM;Left;10 reps;Supine (Max VCs for correct technique.  )  Heel Slides: AAROM;Left;10 reps;Supine Hip ABduction/ADduction: AAROM;Left;10 reps;Supine Straight Leg Raises: AAROM;Left;10 reps;Supine Long Arc Quad: Left;10 reps;Seated;AROM Goniometric ROM: 78 degrees flexion in L knee.      General Comments        Pertinent Vitals/Pain Pain Assessment: Faces Faces Pain Scale: Hurts a  little bit Pain Location: L knee Pain Descriptors / Indicators: Grimacing Pain Intervention(s): Monitored during session;Repositioned;Ice applied    Home Living                      Prior Function            PT Goals (current goals can now be found in the care plan section) Acute Rehab PT Goals Patient Stated Goal: To go home Potential to Achieve Goals: Fair Progress towards PT goals: Progressing toward goals    Frequency    7X/week      PT Plan Current plan remains appropriate    Co-evaluation              AM-PAC PT "6 Clicks" Daily Activity  Outcome Measure  Difficulty turning over in bed (including adjusting bedclothes, sheets and blankets)?: Total Difficulty moving from lying on back to sitting on the side of the bed? : Total Difficulty sitting down on and standing up from a chair with arms (e.g., wheelchair, bedside commode, etc,.)?: Total Help needed moving to and from a bed to chair (including a wheelchair)?: A Lot Help needed walking in hospital room?: A Little Help needed climbing 3-5 steps with a railing? : A Lot 6 Click Score: 10    End of Session Equipment Utilized During Treatment: Gait belt Activity Tolerance: Patient limited by fatigue;Patient limited by pain Patient left: in chair;with call bell/phone within reach;with chair alarm set Nurse Communication: Mobility status PT Visit Diagnosis: Pain;Difficulty in walking, not elsewhere classified (R26.2);Other symptoms and signs involving the nervous system (R29.898) Pain - Right/Left: Left Pain - part of body: Knee     Time: 1205-1222 PT Time Calculation (min) (ACUTE ONLY): 17 min  Charges:  $Gait Training: 8-22 mins                    G Codes:       Governor Rooks, PTA pager [phone]    Cristela Blue 10/30/2016, 12:50 PM

## 2016-10-31 LAB — BASIC METABOLIC PANEL
Anion gap: 9 (ref 5–15)
BUN: 13 mg/dL (ref 6–20)
CALCIUM: 8.6 mg/dL — AB (ref 8.9–10.3)
CO2: 27 mmol/L (ref 22–32)
Chloride: 94 mmol/L — ABNORMAL LOW (ref 101–111)
Creatinine, Ser: 0.93 mg/dL (ref 0.44–1.00)
GFR calc Af Amer: >60 mL/min (ref >60)
GLUCOSE: 83 mg/dL (ref 65–99)
Potassium: 4.1 mmol/L (ref 3.5–5.1)
Sodium: 130 mmol/L — ABNORMAL LOW (ref 135–145)

## 2016-10-31 MED ORDER — MAGNESIUM CITRATE PO SOLN
1.0000 | Freq: Once | ORAL | Status: AC
  Administered 2016-10-31: 1 via ORAL
  Filled 2016-10-31: qty 296

## 2016-10-31 MED ORDER — HYDROMORPHONE HCL 2 MG PO TABS: 2.0000 mg | ORAL_TABLET | ORAL | 0 refills | Status: DC | PRN

## 2016-10-31 MED ORDER — MINERAL OIL RE ENEM
1.0000 | ENEMA | Freq: Once | RECTAL | Status: DC
  Filled 2016-10-31: qty 1

## 2016-10-31 MED ORDER — FLEET ENEMA 7-19 GM/118ML RE ENEM
1.0000 | ENEMA | Freq: Once | RECTAL | Status: AC
  Administered 2016-10-31: 1 via RECTAL
  Filled 2016-10-31: qty 1

## 2016-10-31 NOTE — Progress Notes (Signed)
Physical Therapy Treatment Patient Details Name: Diana Oneill MRN: [id number] DOB: 01-16-1956 Today's Date: 10/31/2016    History of Present Illness Pt admitted for elective L TKA. Pt with no PMH or PSH on file.  Dealing now with postoperative hypotension with AKI, encephalopathy with solemance and hyponatremia.    PT Comments    Pt con't to be sleepy and admits to staying 1/2 asleep at home. Pt not motivated to complete mobility. Pt slowly progressing. Remains to require assist for all mobility due to obesity, weakness, and lethargy. Acute PT to con't to follow.   Follow Up Recommendations  DC plan and follow up therapy as arranged by surgeon;Supervision/Assistance - 24 hour     Equipment Recommendations  3in1 (PT)    Recommendations for Other Services       Precautions / Restrictions Precautions Precautions: Knee Precaution Booklet Issued: No Precaution Comments: pt remains sleepy, "1//2 asleep" Restrictions Weight Bearing Restrictions: Yes LLE Weight Bearing: Weight bearing as tolerated    Mobility  Bed Mobility Overal bed mobility: Needs Assistance         Sit to supine: Mod assist   General bed mobility comments: pt unable to bring LEs into the bed, modA for LE management  Transfers Overall transfer level: Needs assistance Equipment used: Rolling walker (2 wheeled) Transfers: Sit to/from Stand Sit to Stand: Min assist         General transfer comment: needs to stand from elevated surface  Ambulation/Gait Ambulation/Gait assistance: Min guard Ambulation Distance (Feet): 60 Feet (x2) Assistive device: Rolling walker (2 wheeled) Gait Pattern/deviations: Trunk flexed;Decreased stride length;Step-to pattern Gait velocity: slow Gait velocity interpretation: Below normal speed for age/gender General Gait Details: Cues for upper trunk control, cues for RW safety as she is pushing too far forward during gait training.     Stairs          General stair comments: pt able to reiterate sideways technique to spouse and instructed spouse to use L hand rail  Wheelchair Mobility    Modified Rankin (Stroke Patients Only)       Balance Overall balance assessment: Needs assistance Sitting-balance support: Feet supported Sitting balance-Leahy Scale: Good     Standing balance support: During functional activity;Bilateral upper extremity supported;Single extremity supported Standing balance-Leahy Scale: Fair Standing balance comment: requires unilateral support to perform hygiene                            Cognition Arousal/Alertness:  (sleepy) Behavior During Therapy: Flat affect Overall Cognitive Status: Within Functional Limits for tasks assessed                                 General Comments: pt reports "i stay 1/2 asleep at home don't I baby" and spouse verified      Exercises      General Comments        Pertinent Vitals/Pain Pain Assessment: 0-10 Pain Score: 10-Worst pain ever Pain Location: L Knee Pain Descriptors / Indicators:  (pt didn't complain of pain but asked for pain meds) Pain Intervention(s): Monitored during session    Home Living                      Prior Function            PT Goals (current goals can now be found in the care plan section)  Acute Rehab PT Goals Patient Stated Goal: poop PT Goal Formulation: With patient/family Time For Goal Achievement: 11/14/16 Potential to Achieve Goals: Fair Progress towards PT goals: Progressing toward goals (slowly)    Frequency    7X/week      PT Plan Current plan remains appropriate    Co-evaluation              AM-PAC PT "6 Clicks" Daily Activity  Outcome Measure  Difficulty turning over in bed (including adjusting bedclothes, sheets and blankets)?: A Lot Difficulty moving from lying on back to sitting on the side of the bed? : A Lot Difficulty sitting down on and standing up from a  chair with arms (e.g., wheelchair, bedside commode, etc,.)?: A Lot Help needed moving to and from a bed to chair (including a wheelchair)?: A Little Help needed walking in hospital room?: A Little Help needed climbing 3-5 steps with a railing? : A Little 6 Click Score: 15    End of Session Equipment Utilized During Treatment: Gait belt Activity Tolerance: Patient limited by lethargy Patient left: in bed;with call bell/phone within reach;with family/visitor present Nurse Communication: Mobility status;Patient requests pain meds PT Visit Diagnosis: Pain;Difficulty in walking, not elsewhere classified (R26.2);Other symptoms and signs involving the nervous system (R29.898) Pain - Right/Left: Left Pain - part of body: Knee     Time: 3338-3291 PT Time Calculation (min) (ACUTE ONLY): 14 min  Charges:  $Gait Training: 8-22 mins                    G Codes:       Kittie Plater, PT, DPT Pager #: [phone] Office #: [phone]    [place] 10/31/2016, 4:33 PM

## 2016-10-31 NOTE — Progress Notes (Signed)
Pt still has not had a bowel movement on assessment this evening. Dr. Lyla Glassing notified and gave a verbal order for a Fleet Enema and verbalized when pt had a BM she was able to be d/c. Enema administered and pt now having BM. Will continue to monitor and d/c pt.

## 2016-10-31 NOTE — Progress Notes (Signed)
Spoke with Dr. Lyla Glassing regarding discharge in spite of pt not having  BM since 10/24/16. Pt is also on Iron several times a day. Physician gave order for pt to receive Citrate of Magnesia and that pt would need to have BM before she can discharge  to home. Patient has received apple juice, Miralax, colace, one bottle of mag citrate this date.

## 2016-10-31 NOTE — Progress Notes (Signed)
Pt given AVS and all questions answered appropriately. IVs removed and fully intact. Pt will be wheeled down in wheelchair by [place], NT, husband present to drive pt home.

## 2016-10-31 NOTE — Progress Notes (Signed)
   Subjective:  Patient reports pain as mild to moderate.  Denies N/V/CP/SOB. C/o left knee pain.  Objective:   VITALS:   Vitals:   10/29/16 2007 10/30/16 0625 10/30/16 1419 10/31/16 0522  BP: 135/82 (!) 144/77 128/61 121/66  Pulse: 64 63 64 70  Resp:   18 18  Temp: 97.6 F (36.4 C) 98.2 F (36.8 C) 97.6 F (36.4 C) 98.2 F (36.8 C)  TempSrc: Oral Oral Oral Oral  SpO2: 99% 95% 100% 96%  Weight:      Height:        NAD ABD soft Sensation intact distally Intact pulses distally Dorsiflexion/Plantar flexion intact Incision: dressing C/D/I Compartment soft   Lab Results  Component Value Date   WBC 4.2 10/26/2016   HGB 10.2 (L) 10/29/2016   HCT 31.2 (L) 10/29/2016   MCV 85.3 10/26/2016   PLT 112 (L) 10/26/2016   BMET    Component Value Date/Time   NA 130 (L) 10/31/2016 0554   K 4.1 10/31/2016 0554   CL 94 (L) 10/31/2016 0554   CO2 27 10/31/2016 0554   GLUCOSE 83 10/31/2016 0554   BUN 13 10/31/2016 0554   CREATININE 0.93 10/31/2016 0554   CALCIUM 8.6 (L) 10/31/2016 0554   GFRNONAA >60 10/31/2016 0554   GFRAA >60 10/31/2016 0554     Assessment/Plan: 8 Days Post-Op   Principal Problem:   Acute kidney injury (HCC) Active Problems:   Osteoarthritis of left knee   Hyponatremia   Anemia   Thrombocytopenia (HCC)   Acute encephalopathy   Essential hypertension   Obesity   Acidosis   Nausea without vomiting   Tachycardia   COPD (chronic obstructive pulmonary disease) (HCC)   Acute respiratory failure (HCC)   WBAT with walker DVT ppx: ASA, SCDs, TEDs PO pain control: cont PRN dilaudid, low dose due to risk of over sedation ARF: resolved PT/OT Dispo: d/c home with outpatient PT  Pria Klosinski, Horald Pollen 10/31/2016, 7:48 AM   Rod Can, MD Cell [phone]

## 2016-10-31 NOTE — Progress Notes (Signed)
Physical Therapy Treatment Patient Details Name: Diana Oneill MRN: [id number] DOB: 30-Jan-1956 Today's Date: 10/31/2016    History of Present Illness Pt admitted for elective L TKA. Pt with no PMH or PSH on file.  Dealing now with postoperative hypotension with AKI, encephalopathy with solemance and hyponatremia.    PT Comments    Pt sleepy requiring v/c's to stay awake during session this AM. Pt amb 75" with RW and did navigate 3 steps. Pt has increased difficulty standing from low surface height. Pt reports "i'lll just stay in my bed" PT encouraged pt to get up and reminded pt she will have to go to MD appt and outpatient PT. Pt sleepy with poor carryover.    Follow Up Recommendations  DC plan and follow up therapy as arranged by surgeon;Supervision/Assistance - 24 hour     Equipment Recommendations  3in1 (PT)    Recommendations for Other Services       Precautions / Restrictions Precautions Precautions: Knee Precaution Booklet Issued: No Precaution Comments: pt falls asleep easily, unable to retain info Restrictions Weight Bearing Restrictions: Yes LLE Weight Bearing: Weight bearing as tolerated    Mobility  Bed Mobility               General bed mobility comments: pt up on EOB  Transfers Overall transfer level: Needs assistance Equipment used: Rolling walker (2 wheeled) Transfers: Sit to/from Stand Sit to Stand: Min assist;Mod assist         General transfer comment: pt unable to get up for average bed height, pt requires bed to be elevated otherwise pt requiers mod/maxA to achieve standing. Pt stated her couch was low and that's where she wants to be however pt unable to get up from that surface height  Ambulation/Gait Ambulation/Gait assistance: Min guard Ambulation Distance (Feet): 75 Feet Assistive device: Rolling walker (2 wheeled) Gait Pattern/deviations: Trunk flexed;Decreased stride length;Step-to pattern Gait velocity: slow Gait  velocity interpretation: Below normal speed for age/gender General Gait Details: Cues for upper trunk control, cues for RW safety as she is pushing too far forward during gait training.     Stairs Stairs: Yes   Stair Management: One rail Left;Step to pattern;Sideways Number of Stairs: 3 General stair comments: v/c's for technique, sequence, ascending labored effort but didn't require physical assist just v/c's.   Wheelchair Mobility    Modified Rankin (Stroke Patients Only)       Balance Overall balance assessment: Needs assistance Sitting-balance support: Feet supported Sitting balance-Leahy Scale: Good     Standing balance support: During functional activity;Bilateral upper extremity supported;Single extremity supported Standing balance-Leahy Scale: Fair Standing balance comment: requires RW for safe amb                            Cognition Arousal/Alertness: Lethargic (sleepy, requiring v/c's to stay awake) Behavior During Therapy: Flat affect Overall Cognitive Status: Within Functional Limits for tasks assessed                                 General Comments: pt able to recall activities like shower stiumulation and stair negotiation but didn't understand different therapies      Exercises      General Comments        Pertinent Vitals/Pain Pain Assessment: 0-10 Pain Score: 10-Worst pain ever Pain Location: L Knee Pain Descriptors / Indicators:  (pt reports 10/10 despite  falling asleep mid-conversation) Pain Intervention(s): Monitored during session    Home Living                      Prior Function            PT Goals (current goals can now be found in the care plan section) Acute Rehab PT Goals Patient Stated Goal: go home Progress towards PT goals: Progressing toward goals    Frequency    7X/week      PT Plan Current plan remains appropriate    Co-evaluation              AM-PAC PT "6 Clicks"  Daily Activity  Outcome Measure  Difficulty turning over in bed (including adjusting bedclothes, sheets and blankets)?: A Lot Difficulty moving from lying on back to sitting on the side of the bed? : A Lot Difficulty sitting down on and standing up from a chair with arms (e.g., wheelchair, bedside commode, etc,.)?: A Lot Help needed moving to and from a bed to chair (including a wheelchair)?: A Little Help needed walking in hospital room?: A Little Help needed climbing 3-5 steps with a railing? : A Little 6 Click Score: 15    End of Session Equipment Utilized During Treatment: Gait belt Activity Tolerance: Patient limited by fatigue;Patient limited by pain Patient left: in chair;with call bell/phone within reach Nurse Communication: Mobility status (lethargy) PT Visit Diagnosis: Pain;Difficulty in walking, not elsewhere classified (R26.2);Other symptoms and signs involving the nervous system (R29.898) Pain - Right/Left: Left Pain - part of body: Knee     Time: 1100-1127 PT Time Calculation (min) (ACUTE ONLY): 27 min  Charges:  $Gait Training: 23-37 mins                    G Codes:       Diana Oneill, PT, DPT Pager #: [phone] Office #: [phone]   [place] 10/31/2016, 11:42 AM

## 2016-11-02 DIAGNOSIS — M25562 Pain in left knee: Secondary | ICD-10-CM | POA: Diagnosis not present

## 2016-11-02 DIAGNOSIS — M25462 Effusion, left knee: Secondary | ICD-10-CM | POA: Diagnosis not present

## 2016-11-02 DIAGNOSIS — Z96652 Presence of left artificial knee joint: Secondary | ICD-10-CM | POA: Diagnosis not present

## 2016-11-02 DIAGNOSIS — R2689 Other abnormalities of gait and mobility: Secondary | ICD-10-CM | POA: Diagnosis not present

## 2016-11-06 DIAGNOSIS — Z96652 Presence of left artificial knee joint: Secondary | ICD-10-CM | POA: Diagnosis not present

## 2016-11-06 DIAGNOSIS — R2689 Other abnormalities of gait and mobility: Secondary | ICD-10-CM | POA: Diagnosis not present

## 2016-11-06 DIAGNOSIS — M25562 Pain in left knee: Secondary | ICD-10-CM | POA: Diagnosis not present

## 2016-11-06 DIAGNOSIS — M25462 Effusion, left knee: Secondary | ICD-10-CM | POA: Diagnosis not present

## 2016-11-07 DIAGNOSIS — Z471 Aftercare following joint replacement surgery: Secondary | ICD-10-CM | POA: Diagnosis not present

## 2016-11-07 DIAGNOSIS — Z96652 Presence of left artificial knee joint: Secondary | ICD-10-CM | POA: Diagnosis not present

## 2016-11-08 DIAGNOSIS — M25462 Effusion, left knee: Secondary | ICD-10-CM | POA: Diagnosis not present

## 2016-11-08 DIAGNOSIS — Z96652 Presence of left artificial knee joint: Secondary | ICD-10-CM | POA: Diagnosis not present

## 2016-11-08 DIAGNOSIS — R2689 Other abnormalities of gait and mobility: Secondary | ICD-10-CM | POA: Diagnosis not present

## 2016-11-08 DIAGNOSIS — M25562 Pain in left knee: Secondary | ICD-10-CM | POA: Diagnosis not present

## 2016-11-10 DIAGNOSIS — M25562 Pain in left knee: Secondary | ICD-10-CM | POA: Diagnosis not present

## 2016-11-10 DIAGNOSIS — R2689 Other abnormalities of gait and mobility: Secondary | ICD-10-CM | POA: Diagnosis not present

## 2016-11-10 DIAGNOSIS — M25462 Effusion, left knee: Secondary | ICD-10-CM | POA: Diagnosis not present

## 2016-11-10 DIAGNOSIS — Z96652 Presence of left artificial knee joint: Secondary | ICD-10-CM | POA: Diagnosis not present

## 2016-11-13 DIAGNOSIS — R2689 Other abnormalities of gait and mobility: Secondary | ICD-10-CM | POA: Diagnosis not present

## 2016-11-13 DIAGNOSIS — M25462 Effusion, left knee: Secondary | ICD-10-CM | POA: Diagnosis not present

## 2016-11-13 DIAGNOSIS — Z96652 Presence of left artificial knee joint: Secondary | ICD-10-CM | POA: Diagnosis not present

## 2016-11-13 DIAGNOSIS — M25562 Pain in left knee: Secondary | ICD-10-CM | POA: Diagnosis not present

## 2016-11-15 DIAGNOSIS — R2689 Other abnormalities of gait and mobility: Secondary | ICD-10-CM | POA: Diagnosis not present

## 2016-11-15 DIAGNOSIS — M25462 Effusion, left knee: Secondary | ICD-10-CM | POA: Diagnosis not present

## 2016-11-15 DIAGNOSIS — Z96652 Presence of left artificial knee joint: Secondary | ICD-10-CM | POA: Diagnosis not present

## 2016-11-15 DIAGNOSIS — M25562 Pain in left knee: Secondary | ICD-10-CM | POA: Diagnosis not present

## 2016-11-17 DIAGNOSIS — M25462 Effusion, left knee: Secondary | ICD-10-CM | POA: Diagnosis not present

## 2016-11-17 DIAGNOSIS — R2689 Other abnormalities of gait and mobility: Secondary | ICD-10-CM | POA: Diagnosis not present

## 2016-11-17 DIAGNOSIS — M25562 Pain in left knee: Secondary | ICD-10-CM | POA: Diagnosis not present

## 2016-11-17 DIAGNOSIS — Z96652 Presence of left artificial knee joint: Secondary | ICD-10-CM | POA: Diagnosis not present

## 2016-11-20 DIAGNOSIS — Z96652 Presence of left artificial knee joint: Secondary | ICD-10-CM | POA: Diagnosis not present

## 2016-11-20 DIAGNOSIS — R2689 Other abnormalities of gait and mobility: Secondary | ICD-10-CM | POA: Diagnosis not present

## 2016-11-20 DIAGNOSIS — M25462 Effusion, left knee: Secondary | ICD-10-CM | POA: Diagnosis not present

## 2016-11-20 DIAGNOSIS — M25562 Pain in left knee: Secondary | ICD-10-CM | POA: Diagnosis not present

## 2016-11-23 DIAGNOSIS — M25562 Pain in left knee: Secondary | ICD-10-CM | POA: Diagnosis not present

## 2016-11-23 DIAGNOSIS — R2689 Other abnormalities of gait and mobility: Secondary | ICD-10-CM | POA: Diagnosis not present

## 2016-11-23 DIAGNOSIS — Z96652 Presence of left artificial knee joint: Secondary | ICD-10-CM | POA: Diagnosis not present

## 2016-11-23 DIAGNOSIS — M25462 Effusion, left knee: Secondary | ICD-10-CM | POA: Diagnosis not present

## 2016-11-27 DIAGNOSIS — M25462 Effusion, left knee: Secondary | ICD-10-CM | POA: Diagnosis not present

## 2016-11-27 DIAGNOSIS — Z96652 Presence of left artificial knee joint: Secondary | ICD-10-CM | POA: Diagnosis not present

## 2016-11-27 DIAGNOSIS — M25562 Pain in left knee: Secondary | ICD-10-CM | POA: Diagnosis not present

## 2016-11-27 DIAGNOSIS — R2689 Other abnormalities of gait and mobility: Secondary | ICD-10-CM | POA: Diagnosis not present

## 2016-11-29 DIAGNOSIS — R2689 Other abnormalities of gait and mobility: Secondary | ICD-10-CM | POA: Diagnosis not present

## 2016-11-29 DIAGNOSIS — Z96652 Presence of left artificial knee joint: Secondary | ICD-10-CM | POA: Diagnosis not present

## 2016-11-29 DIAGNOSIS — M25462 Effusion, left knee: Secondary | ICD-10-CM | POA: Diagnosis not present

## 2016-11-29 DIAGNOSIS — M25562 Pain in left knee: Secondary | ICD-10-CM | POA: Diagnosis not present

## 2016-12-05 DIAGNOSIS — Z96652 Presence of left artificial knee joint: Secondary | ICD-10-CM | POA: Diagnosis not present

## 2016-12-05 DIAGNOSIS — M25562 Pain in left knee: Secondary | ICD-10-CM | POA: Diagnosis not present

## 2016-12-19 DIAGNOSIS — Z471 Aftercare following joint replacement surgery: Secondary | ICD-10-CM | POA: Diagnosis not present

## 2016-12-19 DIAGNOSIS — Z96652 Presence of left artificial knee joint: Secondary | ICD-10-CM | POA: Diagnosis not present

## 2016-12-21 DIAGNOSIS — S299XXA Unspecified injury of thorax, initial encounter: Secondary | ICD-10-CM | POA: Diagnosis not present

## 2016-12-21 DIAGNOSIS — T148XXA Other injury of unspecified body region, initial encounter: Secondary | ICD-10-CM | POA: Diagnosis not present

## 2016-12-21 DIAGNOSIS — M25571 Pain in right ankle and joints of right foot: Secondary | ICD-10-CM | POA: Diagnosis not present

## 2016-12-21 DIAGNOSIS — G8911 Acute pain due to trauma: Secondary | ICD-10-CM | POA: Diagnosis not present

## 2016-12-21 DIAGNOSIS — S82831A Other fracture of upper and lower end of right fibula, initial encounter for closed fracture: Secondary | ICD-10-CM | POA: Diagnosis not present

## 2016-12-21 DIAGNOSIS — S82491A Other fracture of shaft of right fibula, initial encounter for closed fracture: Secondary | ICD-10-CM | POA: Diagnosis not present

## 2016-12-21 DIAGNOSIS — S92909A Unspecified fracture of unspecified foot, initial encounter for closed fracture: Secondary | ICD-10-CM | POA: Diagnosis not present

## 2016-12-23 DIAGNOSIS — R339 Retention of urine, unspecified: Secondary | ICD-10-CM | POA: Diagnosis present

## 2016-12-23 DIAGNOSIS — F1411 Cocaine abuse, in remission: Secondary | ICD-10-CM | POA: Diagnosis not present

## 2016-12-23 DIAGNOSIS — R279 Unspecified lack of coordination: Secondary | ICD-10-CM | POA: Diagnosis not present

## 2016-12-23 DIAGNOSIS — F419 Anxiety disorder, unspecified: Secondary | ICD-10-CM | POA: Diagnosis present

## 2016-12-23 DIAGNOSIS — M1990 Unspecified osteoarthritis, unspecified site: Secondary | ICD-10-CM | POA: Diagnosis present

## 2016-12-23 DIAGNOSIS — Z915 Personal history of self-harm: Secondary | ICD-10-CM | POA: Diagnosis not present

## 2016-12-23 DIAGNOSIS — S82851D Displaced trimalleolar fracture of right lower leg, subsequent encounter for closed fracture with routine healing: Secondary | ICD-10-CM | POA: Diagnosis not present

## 2016-12-23 DIAGNOSIS — E875 Hyperkalemia: Secondary | ICD-10-CM | POA: Diagnosis present

## 2016-12-23 DIAGNOSIS — I10 Essential (primary) hypertension: Secondary | ICD-10-CM | POA: Diagnosis not present

## 2016-12-23 DIAGNOSIS — Z880 Allergy status to penicillin: Secondary | ICD-10-CM | POA: Diagnosis not present

## 2016-12-23 DIAGNOSIS — Z79891 Long term (current) use of opiate analgesic: Secondary | ICD-10-CM | POA: Diagnosis not present

## 2016-12-23 DIAGNOSIS — D696 Thrombocytopenia, unspecified: Secondary | ICD-10-CM | POA: Diagnosis not present

## 2016-12-23 DIAGNOSIS — R404 Transient alteration of awareness: Secondary | ICD-10-CM | POA: Diagnosis not present

## 2016-12-23 DIAGNOSIS — J189 Pneumonia, unspecified organism: Secondary | ICD-10-CM | POA: Diagnosis not present

## 2016-12-23 DIAGNOSIS — M84671D Pathological fracture in other disease, right ankle, subsequent encounter for fracture with routine healing: Secondary | ICD-10-CM | POA: Diagnosis not present

## 2016-12-23 DIAGNOSIS — F1410 Cocaine abuse, uncomplicated: Secondary | ICD-10-CM

## 2016-12-23 DIAGNOSIS — R4182 Altered mental status, unspecified: Secondary | ICD-10-CM | POA: Diagnosis not present

## 2016-12-23 DIAGNOSIS — N179 Acute kidney failure, unspecified: Secondary | ICD-10-CM | POA: Diagnosis not present

## 2016-12-23 DIAGNOSIS — T405X2A Poisoning by cocaine, intentional self-harm, initial encounter: Secondary | ICD-10-CM | POA: Diagnosis present

## 2016-12-23 DIAGNOSIS — Z79899 Other long term (current) drug therapy: Secondary | ICD-10-CM | POA: Diagnosis not present

## 2016-12-23 DIAGNOSIS — R0789 Other chest pain: Secondary | ICD-10-CM | POA: Diagnosis not present

## 2016-12-23 DIAGNOSIS — T424X2A Poisoning by benzodiazepines, intentional self-harm, initial encounter: Secondary | ICD-10-CM | POA: Diagnosis present

## 2016-12-23 DIAGNOSIS — J690 Pneumonitis due to inhalation of food and vomit: Secondary | ICD-10-CM | POA: Diagnosis present

## 2016-12-23 DIAGNOSIS — Z85828 Personal history of other malignant neoplasm of skin: Secondary | ICD-10-CM | POA: Diagnosis not present

## 2016-12-23 DIAGNOSIS — F339 Major depressive disorder, recurrent, unspecified: Secondary | ICD-10-CM | POA: Diagnosis not present

## 2016-12-23 DIAGNOSIS — M6281 Muscle weakness (generalized): Secondary | ICD-10-CM | POA: Diagnosis not present

## 2016-12-23 DIAGNOSIS — Z6841 Body Mass Index (BMI) 40.0 and over, adult: Secondary | ICD-10-CM | POA: Diagnosis not present

## 2016-12-23 DIAGNOSIS — E872 Acidosis: Secondary | ICD-10-CM | POA: Diagnosis not present

## 2016-12-23 DIAGNOSIS — S82851A Displaced trimalleolar fracture of right lower leg, initial encounter for closed fracture: Secondary | ICD-10-CM | POA: Diagnosis not present

## 2016-12-23 DIAGNOSIS — S8291XD Unspecified fracture of right lower leg, subsequent encounter for closed fracture with routine healing: Secondary | ICD-10-CM | POA: Diagnosis not present

## 2016-12-23 DIAGNOSIS — K219 Gastro-esophageal reflux disease without esophagitis: Secondary | ICD-10-CM | POA: Diagnosis present

## 2016-12-23 DIAGNOSIS — F319 Bipolar disorder, unspecified: Secondary | ICD-10-CM | POA: Diagnosis not present

## 2016-12-23 DIAGNOSIS — S82891A Other fracture of right lower leg, initial encounter for closed fracture: Secondary | ICD-10-CM | POA: Diagnosis not present

## 2016-12-23 DIAGNOSIS — N178 Other acute kidney failure: Secondary | ICD-10-CM | POA: Diagnosis not present

## 2016-12-23 DIAGNOSIS — Z888 Allergy status to other drugs, medicaments and biological substances status: Secondary | ICD-10-CM | POA: Diagnosis not present

## 2016-12-23 DIAGNOSIS — R531 Weakness: Secondary | ICD-10-CM | POA: Diagnosis not present

## 2016-12-23 DIAGNOSIS — J449 Chronic obstructive pulmonary disease, unspecified: Secondary | ICD-10-CM | POA: Diagnosis not present

## 2016-12-23 DIAGNOSIS — B379 Candidiasis, unspecified: Secondary | ICD-10-CM | POA: Diagnosis not present

## 2016-12-23 DIAGNOSIS — T40602A Poisoning by unspecified narcotics, intentional self-harm, initial encounter: Secondary | ICD-10-CM | POA: Diagnosis present

## 2016-12-23 DIAGNOSIS — G92 Toxic encephalopathy: Secondary | ICD-10-CM | POA: Diagnosis not present

## 2016-12-23 DIAGNOSIS — Z7401 Bed confinement status: Secondary | ICD-10-CM | POA: Diagnosis not present

## 2016-12-23 DIAGNOSIS — M84671A Pathological fracture in other disease, right ankle, initial encounter for fracture: Secondary | ICD-10-CM | POA: Diagnosis not present

## 2016-12-24 DIAGNOSIS — J189 Pneumonia, unspecified organism: Secondary | ICD-10-CM

## 2016-12-25 DIAGNOSIS — J189 Pneumonia, unspecified organism: Secondary | ICD-10-CM

## 2016-12-29 DIAGNOSIS — S82851D Displaced trimalleolar fracture of right lower leg, subsequent encounter for closed fracture with routine healing: Secondary | ICD-10-CM | POA: Diagnosis not present

## 2016-12-29 DIAGNOSIS — E875 Hyperkalemia: Secondary | ICD-10-CM | POA: Diagnosis not present

## 2016-12-29 DIAGNOSIS — Z6841 Body Mass Index (BMI) 40.0 and over, adult: Secondary | ICD-10-CM | POA: Diagnosis not present

## 2016-12-29 DIAGNOSIS — M84671A Pathological fracture in other disease, right ankle, initial encounter for fracture: Secondary | ICD-10-CM | POA: Diagnosis not present

## 2016-12-29 DIAGNOSIS — Y92009 Unspecified place in unspecified non-institutional (private) residence as the place of occurrence of the external cause: Secondary | ICD-10-CM | POA: Diagnosis not present

## 2016-12-29 DIAGNOSIS — Z915 Personal history of self-harm: Secondary | ICD-10-CM | POA: Diagnosis not present

## 2016-12-29 DIAGNOSIS — W1839XA Other fall on same level, initial encounter: Secondary | ICD-10-CM | POA: Diagnosis present

## 2016-12-29 DIAGNOSIS — J189 Pneumonia, unspecified organism: Secondary | ICD-10-CM | POA: Diagnosis not present

## 2016-12-29 DIAGNOSIS — D696 Thrombocytopenia, unspecified: Secondary | ICD-10-CM | POA: Diagnosis not present

## 2016-12-29 DIAGNOSIS — F419 Anxiety disorder, unspecified: Secondary | ICD-10-CM | POA: Diagnosis not present

## 2016-12-29 DIAGNOSIS — I10 Essential (primary) hypertension: Secondary | ICD-10-CM | POA: Diagnosis not present

## 2016-12-29 DIAGNOSIS — M25571 Pain in right ankle and joints of right foot: Secondary | ICD-10-CM | POA: Diagnosis present

## 2016-12-29 DIAGNOSIS — S82851A Displaced trimalleolar fracture of right lower leg, initial encounter for closed fracture: Secondary | ICD-10-CM | POA: Diagnosis not present

## 2016-12-29 DIAGNOSIS — S8291XD Unspecified fracture of right lower leg, subsequent encounter for closed fracture with routine healing: Secondary | ICD-10-CM | POA: Diagnosis not present

## 2016-12-29 DIAGNOSIS — G4730 Sleep apnea, unspecified: Secondary | ICD-10-CM | POA: Diagnosis present

## 2016-12-29 DIAGNOSIS — Z87891 Personal history of nicotine dependence: Secondary | ICD-10-CM | POA: Diagnosis not present

## 2016-12-29 DIAGNOSIS — Z9884 Bariatric surgery status: Secondary | ICD-10-CM | POA: Diagnosis not present

## 2016-12-29 DIAGNOSIS — S82891A Other fracture of right lower leg, initial encounter for closed fracture: Secondary | ICD-10-CM | POA: Diagnosis not present

## 2016-12-29 DIAGNOSIS — E872 Acidosis: Secondary | ICD-10-CM | POA: Diagnosis not present

## 2016-12-29 DIAGNOSIS — Z85828 Personal history of other malignant neoplasm of skin: Secondary | ICD-10-CM | POA: Diagnosis not present

## 2016-12-29 DIAGNOSIS — F319 Bipolar disorder, unspecified: Secondary | ICD-10-CM | POA: Diagnosis not present

## 2016-12-29 DIAGNOSIS — F1410 Cocaine abuse, uncomplicated: Secondary | ICD-10-CM | POA: Diagnosis not present

## 2016-12-29 DIAGNOSIS — W19XXXA Unspecified fall, initial encounter: Secondary | ICD-10-CM | POA: Diagnosis not present

## 2016-12-29 DIAGNOSIS — F339 Major depressive disorder, recurrent, unspecified: Secondary | ICD-10-CM | POA: Diagnosis not present

## 2016-12-29 DIAGNOSIS — R279 Unspecified lack of coordination: Secondary | ICD-10-CM | POA: Diagnosis not present

## 2016-12-29 DIAGNOSIS — Z7401 Bed confinement status: Secondary | ICD-10-CM | POA: Diagnosis not present

## 2016-12-29 DIAGNOSIS — M84671D Pathological fracture in other disease, right ankle, subsequent encounter for fracture with routine healing: Secondary | ICD-10-CM | POA: Diagnosis not present

## 2016-12-29 DIAGNOSIS — Z23 Encounter for immunization: Secondary | ICD-10-CM | POA: Diagnosis not present

## 2016-12-29 DIAGNOSIS — R4182 Altered mental status, unspecified: Secondary | ICD-10-CM | POA: Diagnosis not present

## 2016-12-29 DIAGNOSIS — Y9301 Activity, walking, marching and hiking: Secondary | ICD-10-CM | POA: Diagnosis present

## 2016-12-29 DIAGNOSIS — F1411 Cocaine abuse, in remission: Secondary | ICD-10-CM | POA: Diagnosis not present

## 2016-12-29 DIAGNOSIS — N178 Other acute kidney failure: Secondary | ICD-10-CM | POA: Diagnosis not present

## 2016-12-29 DIAGNOSIS — Z96651 Presence of right artificial knee joint: Secondary | ICD-10-CM | POA: Diagnosis not present

## 2016-12-29 DIAGNOSIS — Z8542 Personal history of malignant neoplasm of other parts of uterus: Secondary | ICD-10-CM | POA: Diagnosis not present

## 2016-12-29 DIAGNOSIS — Z90710 Acquired absence of both cervix and uterus: Secondary | ICD-10-CM | POA: Diagnosis not present

## 2016-12-29 DIAGNOSIS — G92 Toxic encephalopathy: Secondary | ICD-10-CM | POA: Diagnosis not present

## 2016-12-29 DIAGNOSIS — M6281 Muscle weakness (generalized): Secondary | ICD-10-CM | POA: Diagnosis not present

## 2016-12-29 DIAGNOSIS — R0789 Other chest pain: Secondary | ICD-10-CM | POA: Diagnosis not present

## 2016-12-29 DIAGNOSIS — Z96653 Presence of artificial knee joint, bilateral: Secondary | ICD-10-CM | POA: Diagnosis present

## 2016-12-29 DIAGNOSIS — R339 Retention of urine, unspecified: Secondary | ICD-10-CM | POA: Diagnosis not present

## 2016-12-29 DIAGNOSIS — B379 Candidiasis, unspecified: Secondary | ICD-10-CM | POA: Diagnosis not present

## 2016-12-29 DIAGNOSIS — E669 Obesity, unspecified: Secondary | ICD-10-CM | POA: Diagnosis present

## 2016-12-29 DIAGNOSIS — J449 Chronic obstructive pulmonary disease, unspecified: Secondary | ICD-10-CM | POA: Diagnosis not present

## 2016-12-29 DIAGNOSIS — N179 Acute kidney failure, unspecified: Secondary | ICD-10-CM | POA: Diagnosis not present

## 2017-01-05 ENCOUNTER — Inpatient Hospital Stay (HOSPITAL_COMMUNITY)
Admission: AD | Admit: 2017-01-05 | Discharge: 2017-01-08 | DRG: 493 | Disposition: A | Discharge status: Skilled Nursing Facility | Payer: Medicare Other | Source: Ambulatory Visit | Attending: Orthopedic Surgery | Admitting: Orthopedic Surgery

## 2017-01-05 DIAGNOSIS — Z23 Encounter for immunization: Secondary | ICD-10-CM | POA: Diagnosis not present

## 2017-01-05 DIAGNOSIS — Y9301 Activity, walking, marching and hiking: Secondary | ICD-10-CM | POA: Diagnosis present

## 2017-01-05 DIAGNOSIS — E872 Acidosis: Secondary | ICD-10-CM | POA: Diagnosis not present

## 2017-01-05 DIAGNOSIS — S82851A Displaced trimalleolar fracture of right lower leg, initial encounter for closed fracture: Principal | ICD-10-CM | POA: Diagnosis present

## 2017-01-05 DIAGNOSIS — S82851D Displaced trimalleolar fracture of right lower leg, subsequent encounter for closed fracture with routine healing: Secondary | ICD-10-CM | POA: Diagnosis not present

## 2017-01-05 DIAGNOSIS — I10 Essential (primary) hypertension: Secondary | ICD-10-CM | POA: Diagnosis present

## 2017-01-05 DIAGNOSIS — B379 Candidiasis, unspecified: Secondary | ICD-10-CM | POA: Diagnosis not present

## 2017-01-05 DIAGNOSIS — M25571 Pain in right ankle and joints of right foot: Secondary | ICD-10-CM | POA: Diagnosis present

## 2017-01-05 DIAGNOSIS — Z915 Personal history of self-harm: Secondary | ICD-10-CM | POA: Diagnosis not present

## 2017-01-05 DIAGNOSIS — F419 Anxiety disorder, unspecified: Secondary | ICD-10-CM | POA: Diagnosis not present

## 2017-01-05 DIAGNOSIS — Y92009 Unspecified place in unspecified non-institutional (private) residence as the place of occurrence of the external cause: Secondary | ICD-10-CM

## 2017-01-05 DIAGNOSIS — F1411 Cocaine abuse, in remission: Secondary | ICD-10-CM | POA: Diagnosis not present

## 2017-01-05 DIAGNOSIS — M6281 Muscle weakness (generalized): Secondary | ICD-10-CM | POA: Diagnosis not present

## 2017-01-05 DIAGNOSIS — Z96653 Presence of artificial knee joint, bilateral: Secondary | ICD-10-CM | POA: Diagnosis not present

## 2017-01-05 DIAGNOSIS — M84671D Pathological fracture in other disease, right ankle, subsequent encounter for fracture with routine healing: Secondary | ICD-10-CM | POA: Diagnosis not present

## 2017-01-05 DIAGNOSIS — Z87891 Personal history of nicotine dependence: Secondary | ICD-10-CM | POA: Diagnosis not present

## 2017-01-05 DIAGNOSIS — Z9884 Bariatric surgery status: Secondary | ICD-10-CM | POA: Diagnosis not present

## 2017-01-05 DIAGNOSIS — E875 Hyperkalemia: Secondary | ICD-10-CM | POA: Diagnosis not present

## 2017-01-05 DIAGNOSIS — R0789 Other chest pain: Secondary | ICD-10-CM | POA: Diagnosis not present

## 2017-01-05 DIAGNOSIS — S8291XD Unspecified fracture of right lower leg, subsequent encounter for closed fracture with routine healing: Secondary | ICD-10-CM | POA: Diagnosis not present

## 2017-01-05 DIAGNOSIS — J449 Chronic obstructive pulmonary disease, unspecified: Secondary | ICD-10-CM | POA: Diagnosis present

## 2017-01-05 DIAGNOSIS — G92 Toxic encephalopathy: Secondary | ICD-10-CM | POA: Diagnosis not present

## 2017-01-05 DIAGNOSIS — M84671A Pathological fracture in other disease, right ankle, initial encounter for fracture: Secondary | ICD-10-CM | POA: Diagnosis not present

## 2017-01-05 DIAGNOSIS — G4730 Sleep apnea, unspecified: Secondary | ICD-10-CM | POA: Diagnosis not present

## 2017-01-05 DIAGNOSIS — Z85828 Personal history of other malignant neoplasm of skin: Secondary | ICD-10-CM

## 2017-01-05 DIAGNOSIS — G8918 Other acute postprocedural pain: Secondary | ICD-10-CM | POA: Diagnosis not present

## 2017-01-05 DIAGNOSIS — W1839XA Other fall on same level, initial encounter: Secondary | ICD-10-CM | POA: Diagnosis present

## 2017-01-05 DIAGNOSIS — G8911 Acute pain due to trauma: Secondary | ICD-10-CM | POA: Diagnosis not present

## 2017-01-05 DIAGNOSIS — Z96651 Presence of right artificial knee joint: Secondary | ICD-10-CM | POA: Diagnosis not present

## 2017-01-05 DIAGNOSIS — Z6841 Body Mass Index (BMI) 40.0 and over, adult: Secondary | ICD-10-CM | POA: Diagnosis not present

## 2017-01-05 DIAGNOSIS — Z8542 Personal history of malignant neoplasm of other parts of uterus: Secondary | ICD-10-CM

## 2017-01-05 DIAGNOSIS — R339 Retention of urine, unspecified: Secondary | ICD-10-CM | POA: Diagnosis not present

## 2017-01-05 DIAGNOSIS — W19XXXA Unspecified fall, initial encounter: Secondary | ICD-10-CM | POA: Diagnosis not present

## 2017-01-05 DIAGNOSIS — F319 Bipolar disorder, unspecified: Secondary | ICD-10-CM | POA: Diagnosis not present

## 2017-01-05 DIAGNOSIS — S92909A Unspecified fracture of unspecified foot, initial encounter for closed fracture: Secondary | ICD-10-CM | POA: Diagnosis not present

## 2017-01-05 DIAGNOSIS — E669 Obesity, unspecified: Secondary | ICD-10-CM | POA: Diagnosis not present

## 2017-01-05 DIAGNOSIS — F339 Major depressive disorder, recurrent, unspecified: Secondary | ICD-10-CM | POA: Diagnosis not present

## 2017-01-05 DIAGNOSIS — Z90710 Acquired absence of both cervix and uterus: Secondary | ICD-10-CM | POA: Diagnosis not present

## 2017-01-05 DIAGNOSIS — D696 Thrombocytopenia, unspecified: Secondary | ICD-10-CM | POA: Diagnosis not present

## 2017-01-05 DIAGNOSIS — N178 Other acute kidney failure: Secondary | ICD-10-CM | POA: Diagnosis not present

## 2017-01-05 HISTORY — DX: Displaced trimalleolar fracture of right lower leg, initial encounter for closed fracture: S82.851A

## 2017-01-05 LAB — CBC
HCT: 26.8 % — ABNORMAL LOW (ref 36.0–46.0)
Hemoglobin: 8.8 g/dL — ABNORMAL LOW (ref 12.0–15.0)
MCH: 27.3 pg (ref 26.0–34.0)
MCHC: 32.8 g/dL (ref 30.0–36.0)
MCV: 83.2 fL (ref 78.0–100.0)
Platelets: 362 10*3/uL (ref 150–400)
RBC: 3.22 MIL/uL — ABNORMAL LOW (ref 3.87–5.11)
RDW: 14.2 % (ref 11.5–15.5)
WBC: 9.8 10*3/uL (ref 4.0–10.5)

## 2017-01-05 LAB — TYPE AND SCREEN
ABO/RH(D): B POS
Antibody Screen: NEGATIVE

## 2017-01-05 LAB — PROTIME-INR
INR: 0.99
Prothrombin Time: 13 seconds (ref 11.4–15.2)

## 2017-01-05 LAB — SURGICAL PCR SCREEN
MRSA, PCR: NEGATIVE
STAPHYLOCOCCUS AUREUS: NEGATIVE

## 2017-01-05 MED ORDER — ALBUTEROL SULFATE (2.5 MG/3ML) 0.083% IN NEBU
INHALATION_SOLUTION | RESPIRATORY_TRACT | Status: AC
  Filled 2017-01-05: qty 3

## 2017-01-05 MED ORDER — SODIUM CHLORIDE 0.9 % IV SOLN
INTRAVENOUS | Status: DC
  Administered 2017-01-05 – 2017-01-06 (×3): via INTRAVENOUS

## 2017-01-05 MED ORDER — ACETAMINOPHEN 650 MG RE SUPP: 650.0000 mg | Freq: Four times a day (QID) | RECTAL | Status: DC | PRN

## 2017-01-05 MED ORDER — ONDANSETRON HCL 4 MG/2ML IJ SOLN
4.0000 mg | Freq: Four times a day (QID) | INTRAMUSCULAR | Status: DC | PRN
Start: 2017-01-05 — End: 2017-01-06
  Administered 2017-01-06: 4 mg via INTRAVENOUS

## 2017-01-05 MED ORDER — ACETAMINOPHEN 325 MG PO TABS
650.0000 mg | ORAL_TABLET | Freq: Four times a day (QID) | ORAL | Status: DC | PRN
  Filled 2017-01-05: qty 2

## 2017-01-05 MED ORDER — OXYCODONE HCL 5 MG PO TABS
5.0000 mg | ORAL_TABLET | ORAL | Status: DC | PRN
Start: 2017-01-05 — End: 2017-01-06
  Administered 2017-01-05 – 2017-01-06 (×3): 10 mg via ORAL
  Filled 2017-01-05 (×3): qty 2

## 2017-01-05 MED ORDER — POLYETHYLENE GLYCOL 3350 17 G PO PACK: 17.0000 g | PACK | Freq: Every day | ORAL | Status: DC | PRN

## 2017-01-05 MED ORDER — HYDROMORPHONE HCL 1 MG/ML IJ SOLN
0.5000 mg | INTRAMUSCULAR | Status: DC | PRN
  Administered 2017-01-05: 1 mg via INTRAVENOUS
  Filled 2017-01-05: qty 1

## 2017-01-05 MED ORDER — DOCUSATE SODIUM 100 MG PO CAPS
100.0000 mg | ORAL_CAPSULE | Freq: Two times a day (BID) | ORAL | Status: DC
  Administered 2017-01-05 (×2): 100 mg via ORAL
  Filled 2017-01-05 (×2): qty 1

## 2017-01-05 MED ORDER — ONDANSETRON HCL 4 MG PO TABS: 4.0000 mg | ORAL_TABLET | Freq: Four times a day (QID) | ORAL | Status: DC | PRN

## 2017-01-05 MED ORDER — ALBUTEROL SULFATE (2.5 MG/3ML) 0.083% IN NEBU
0.6300 mg | INHALATION_SOLUTION | Freq: Four times a day (QID) | RESPIRATORY_TRACT | Status: DC | PRN
  Administered 2017-01-05: 0.63 mg via RESPIRATORY_TRACT

## 2017-01-05 MED ORDER — SENNA 8.6 MG PO TABS
1.0000 | ORAL_TABLET | Freq: Two times a day (BID) | ORAL | Status: DC
  Administered 2017-01-05 (×2): 8.6 mg via ORAL
  Filled 2017-01-05 (×2): qty 1

## 2017-01-05 NOTE — Anesthesia Preprocedure Evaluation (Addendum)
Anesthesia Evaluation  Patient identified by MRN, date of birth, ID band Patient awake    Reviewed: Allergy & Precautions, NPO status , Patient's Chart, lab work & pertinent test results  Airway Mallampati: III  TM Distance: >3 FB Neck ROM: Full    Dental  (+) Edentulous Upper, Dental Advisory Given   Pulmonary shortness of breath and with exertion, sleep apnea , COPD (mild, controlled),  COPD inhaler, former smoker,    Pulmonary exam normal breath sounds clear to auscultation       Cardiovascular hypertension, Pt. on medications Normal cardiovascular exam Rhythm:Regular Rate:Normal  ECG: NSR, rate 60   Neuro/Psych Anxiety Depression negative neurological ROS     GI/Hepatic negative GI ROS, Neg liver ROS,   Endo/Other    Renal/GU negative Renal ROS     Musculoskeletal  (+) Arthritis , Osteoarthritis,    Abdominal (+) + obese,   Peds  Hematology  (+) anemia ,   Anesthesia Other Findings BMI 41  Reproductive/Obstetrics                            Anesthesia Physical  Anesthesia Plan  ASA: III  Anesthesia Plan: General   Post-op Pain Management: GA combined w/ Regional for post-op pain   Induction: Intravenous  PONV Risk Score and Plan: 3 and Ondansetron, Dexamethasone and Treatment may vary due to age or medical condition  Airway Management Planned: LMA  Additional Equipment:   Intra-op Plan:   Post-operative Plan: Extubation in OR  Informed Consent: I have reviewed the patients History and Physical, chart, labs and discussed the procedure including the risks, benefits and alternatives for the proposed anesthesia with the patient or authorized representative who has indicated his/her understanding and acceptance.   Dental advisory given  Plan Discussed with: CRNA  Anesthesia Plan Comments:       Anesthesia Quick Evaluation

## 2017-01-05 NOTE — H&P (Signed)
ORTHOPAEDIC H&P  PCP:  Celedonio Miyamoto (Inactive)  Chief Complaint: Right trimalleolar ankle fracture  HPI: Diana Oneill is a 61 y.o. female who is known to myself for previous staged bilateral total knee arthroplasties. Patient overdosed on pain medication and cocaine, and she fell and injured her right knee 13 days ago. She was admitted to [facility]. Patient came to the office today. X-rays showed a trimalleolar ankle fracture with lateral subluxation of the talus. I recommended hospital admission and surgical fixation.  Past Medical History:  Diagnosis Date  . Acute kidney injury ([place])   . Anemia   . Anxiety   . Arthritis   . Cancer ([place])    skin cancer, uterine cancer  . COPD (chronic obstructive pulmonary disease) ([place])   . Depression   . Dyspnea    with exertion  . History of kidney stones   . Hypertension   . Hyponatremia   . Osteoarthritis   . Sleep apnea    does not use CPAP "Makes too much Noise"  . Thrombocytopenia ([place])   . Thrombocytopenia ([place]) 09/2016   Past Surgical History:  Procedure Laterality Date  . ABDOMINAL HYSTERECTOMY    . ACHILLES TENDON REPAIR Left   . GASTRIC BYPASS    . JOINT REPLACEMENT Right    knee replace  . KNEE ARTHROPLASTY Left 10/23/2016   Procedure: COMPUTER ASSISTED TOTAL KNEE ARTHROPLASTY;  Surgeon: Rod Can, MD;  Location: [place];  Service: Orthopedics;  Laterality: Left;   Social History   Social History  . Marital status: Married    Spouse name: N/A  . Number of children: N/A  . Years of education: N/A   Social History Main Topics  . Smoking status: Former Smoker    Packs/day: 1.00    Years: 40.00    Types: Cigarettes    Quit date: 07/30/2016  . Smokeless tobacco: Never Used  . Alcohol use No  . Drug use: Yes  . Sexual activity: Not on file   Other Topics Concern  . Not on file   Social History Narrative  . No narrative on file   No family history on file. Allergies  Allergen Reactions   . Penicillins Anaphylaxis    PATIENT HAD A PCN REACTION WITH IMMEDIATE RASH, FACIAL/TONGUE/THROAT SWELLING, SOB, OR LIGHTHEADEDNESS WITH HYPOTENSION:  #  #  #  YES  #  #  #   Has patient had a PCN reaction causing severe rash involving mucus membranes or skin necrosis: No Has patient had a PCN reaction that required hospitalization No Has patient had a PCN reaction occurring within the last 10 years: No If all of the above answers are "NO", then may proceed with Cephalosporin use.   . Prozac [Fluoxetine Hcl] Hives and Swelling    SWELLING REACTION UNSPECIFIED    Prior to Admission medications   Medication Sig Start Date End Date Taking? Authorizing Provider  albuterol (ACCUNEB) 0.63 MG/3ML nebulizer solution Take 1 ampule by nebulization every 6 (six) hours as needed for wheezing.    [provider]  aspirin 81 MG chewable tablet Chew 1 tablet (81 mg total) by mouth 2 (two) times daily. 10/24/16   Shoaib Siefker, Aaron Edelman, MD  citalopram (CELEXA) 20 MG tablet Take 40 mg by mouth daily.    [provider]  clonazePAM (KLONOPIN) 0.5 MG tablet Take 0.5 mg by mouth every 8 (eight) hours.     [provider]  cyclobenzaprine (FLEXERIL) 5 MG tablet Take 5 mg by  mouth at bedtime.    [provider]  divalproex (DEPAKOTE) 500 MG DR tablet Take 500 mg by mouth 2 (two) times daily.    [provider]  docusate sodium (COLACE) 100 MG capsule Take 1 capsule (100 mg total) by mouth 2 (two) times daily. 10/24/16   Mekisha Bittel, Aaron Edelman, MD  gabapentin (NEURONTIN) 300 MG capsule Take 300-600 mg by mouth every 8 (eight) hours.     [provider]  HYDROmorphone (DILAUDID) 2 MG tablet Take 1 tablet (2 mg total) by mouth every 4 (four) hours as needed for severe pain. 10/31/16   Soriya Worster, Aaron Edelman, MD  lisinopril (PRINIVIL,ZESTRIL) 10 MG tablet Take 10 mg by mouth daily.    [provider]  Lurasidone HCl (LATUDA) 120 MG TABS Take 120 mg by mouth daily.    [provider]  ondansetron (ZOFRAN) 4 MG tablet Take 1 tablet (4 mg total) by mouth every 6 (six) hours as needed for nausea. 10/24/16   Jeremaine Maraj, Aaron Edelman, MD  senna (SENOKOT) 8.6 MG TABS tablet Take 2 tablets (17.2 mg total) by mouth at bedtime. 10/24/16   Julia Kulzer, Aaron Edelman, MD   No results found.  Positive ROS: All other systems have been reviewed and were otherwise negative with the exception of those mentioned in the HPI and as above.  Physical Exam: General: Alert, no acute distress Cardiovascular: No pedal edema Respiratory: No cyanosis, no use of accessory musculature GI: No organomegaly, abdomen is soft and non-tender Skin: No lesions in the area of chief complaint Neurologic: Sensation intact distally Psychiatric: Patient is competent for consent with normal mood and affect Lymphatic: No axillary or cervical lymphadenopathy  MUSCULOSKELETAL: Examination of the right lower extremity reveals that she has a short leg splint. I loosened the splint in order to check her skin. She has mild swelling. No significant ecchymosis. She does have skin wrinkling present. No skin ulcers. She has palpable pulses. She has subjective sensory change to the superficial peroneal distribution. She is able to wiggle her toes.  Assessment: Right trimalleolar ankle fracture with lateral subluxation of talus  Plan: Patient will be nonweightbearing right lower extremity. Continue splint. I have discussed the patient with my partner, Dr. Doran Durand, who is a fellowship trained foot and ankle specialist. He plans to perform ORIF of the right ankle tomorrow. Nothing by mouth after midnight. Hold chemical DVT prophylaxis. Postoperatively, she will need to work with physical therapy. She will be able to discharge back to skilled nursing facility when appropriate.  The risks, benefits, and alternatives were discussed with the patient. There are risks associated with the surgery including, but not limited to, problems with  anesthesia (death), infection, differences in leg length/angulation/rotation, fracture of bones, loosening or failure of implants, malunion, nonunion, hematoma (blood accumulation) which may require surgical drainage, blood clots, pulmonary embolism, nerve injury (foot drop), and blood vessel injury. The patient understands these risks and elects to proceed.   Kelcie Currie, Horald Pollen, MD Cell [phone]    01/05/2017 12:57 PM

## 2017-01-05 NOTE — Progress Notes (Signed)
Direct admit to 6n25. Husband at bedside, pt came in with foley catheter, skin intact, right leg in compression wrap, toes warm to touch, can wiggle them, oriented to room and surroundings, Dr. Lyla Glassing notified of arrival.

## 2017-01-06 ENCOUNTER — Inpatient Hospital Stay (HOSPITAL_COMMUNITY): Payer: Medicare Other | Admitting: Anesthesiology

## 2017-01-06 ENCOUNTER — Encounter (HOSPITAL_COMMUNITY)
Admission: AD | Disposition: A | Discharge status: Skilled Nursing Facility | Payer: Self-pay | Source: Ambulatory Visit | Attending: Orthopedic Surgery

## 2017-01-06 HISTORY — PX: ORIF ANKLE FRACTURE: SHX5408

## 2017-01-06 LAB — CREATININE, SERUM
CREATININE: 0.96 mg/dL (ref 0.44–1.00)
GFR calc Af Amer: >60 mL/min (ref >60)

## 2017-01-06 LAB — HIV ANTIBODY (ROUTINE TESTING W REFLEX): HIV Screen 4th Generation wRfx: NONREACTIVE

## 2017-01-06 SURGERY — OPEN REDUCTION INTERNAL FIXATION (ORIF) ANKLE FRACTURE
Anesthesia: General | Site: Ankle | Laterality: Right

## 2017-01-06 MED ORDER — CITALOPRAM HYDROBROMIDE 40 MG PO TABS
40.0000 mg | ORAL_TABLET | Freq: Every day | ORAL | Status: DC
  Administered 2017-01-06 – 2017-01-08 (×3): 40 mg via ORAL
  Filled 2017-01-06: qty 2
  Filled 2017-01-06 (×2): qty 1
  Filled 2017-01-06 (×2): qty 2
  Filled 2017-01-06: qty 1

## 2017-01-06 MED ORDER — OXYCODONE HCL 5 MG PO TABS
5.0000 mg | ORAL_TABLET | ORAL | Status: DC | PRN
  Administered 2017-01-06 – 2017-01-07 (×6): 10 mg via ORAL
  Administered 2017-01-08: 5 mg via ORAL
  Administered 2017-01-08: 10 mg via ORAL
  Administered 2017-01-08: 5 mg via ORAL
  Filled 2017-01-06: qty 2
  Filled 2017-01-06: qty 1
  Filled 2017-01-06: qty 2
  Filled 2017-01-06: qty 1
  Filled 2017-01-06 (×5): qty 2

## 2017-01-06 MED ORDER — HYDROMORPHONE HCL 1 MG/ML IJ SOLN
0.2500 mg | INTRAMUSCULAR | Status: DC | PRN
  Administered 2017-01-06 (×2): 0.5 mg via INTRAVENOUS

## 2017-01-06 MED ORDER — DIVALPROEX SODIUM 500 MG PO DR TAB
500.0000 mg | DELAYED_RELEASE_TABLET | Freq: Two times a day (BID) | ORAL | Status: DC
  Administered 2017-01-06 – 2017-01-08 (×4): 500 mg via ORAL
  Filled 2017-01-06 (×5): qty 1

## 2017-01-06 MED ORDER — CLINDAMYCIN PHOSPHATE 900 MG/50ML IV SOLN
INTRAVENOUS | Status: AC
  Filled 2017-01-06: qty 50

## 2017-01-06 MED ORDER — LURASIDONE HCL 120 MG PO TABS: 120.0000 mg | ORAL_TABLET | Freq: Every day | ORAL | Status: DC

## 2017-01-06 MED ORDER — MORPHINE SULFATE (PF) 2 MG/ML IV SOLN: 1.0000 mg | INTRAVENOUS | Status: DC | PRN

## 2017-01-06 MED ORDER — ENOXAPARIN SODIUM 40 MG/0.4ML ~~LOC~~ SOLN
40.0000 mg | SUBCUTANEOUS | Status: DC
  Administered 2017-01-07 – 2017-01-08 (×2): 40 mg via SUBCUTANEOUS
  Filled 2017-01-06 (×2): qty 0.4

## 2017-01-06 MED ORDER — DIPHENHYDRAMINE HCL 12.5 MG/5ML PO ELIX: 12.5000 mg | ORAL_SOLUTION | ORAL | Status: DC | PRN

## 2017-01-06 MED ORDER — ALBUTEROL SULFATE 0.63 MG/3ML IN NEBU: 1.0000 | INHALATION_SOLUTION | Freq: Four times a day (QID) | RESPIRATORY_TRACT | Status: DC | PRN

## 2017-01-06 MED ORDER — ACETAMINOPHEN 650 MG RE SUPP: 650.0000 mg | Freq: Four times a day (QID) | RECTAL | Status: DC | PRN

## 2017-01-06 MED ORDER — DEXAMETHASONE SODIUM PHOSPHATE 10 MG/ML IJ SOLN
INTRAMUSCULAR | Status: DC | PRN
  Administered 2017-01-06: 10 mg via INTRAVENOUS

## 2017-01-06 MED ORDER — CHLORHEXIDINE GLUCONATE 4 % EX LIQD: 60.0000 mL | Freq: Once | CUTANEOUS | Status: DC

## 2017-01-06 MED ORDER — 0.9 % SODIUM CHLORIDE (POUR BTL) OPTIME
TOPICAL | Status: DC | PRN
  Administered 2017-01-06: 1000 mL

## 2017-01-06 MED ORDER — FENTANYL CITRATE (PF) 100 MCG/2ML IJ SOLN
INTRAMUSCULAR | Status: DC | PRN
  Administered 2017-01-06: 50 ug via INTRAVENOUS

## 2017-01-06 MED ORDER — DEXTROSE 5 % IV SOLN
INTRAVENOUS | Status: DC | PRN
  Administered 2017-01-06: 30 ug/min via INTRAVENOUS

## 2017-01-06 MED ORDER — LISINOPRIL 10 MG PO TABS
10.0000 mg | ORAL_TABLET | Freq: Every day | ORAL | Status: DC
  Administered 2017-01-07 – 2017-01-08 (×2): 10 mg via ORAL
  Filled 2017-01-06 (×2): qty 1

## 2017-01-06 MED ORDER — CLONAZEPAM 0.5 MG PO TABS
0.5000 mg | ORAL_TABLET | Freq: Three times a day (TID) | ORAL | Status: DC
  Filled 2017-01-06 (×2): qty 1

## 2017-01-06 MED ORDER — SENNA 8.6 MG PO TABS: 1.0000 | ORAL_TABLET | Freq: Two times a day (BID) | ORAL | Status: DC

## 2017-01-06 MED ORDER — ONDANSETRON HCL 4 MG PO TABS
4.0000 mg | ORAL_TABLET | Freq: Four times a day (QID) | ORAL | Status: DC | PRN
  Filled 2017-01-06: qty 1

## 2017-01-06 MED ORDER — LACTATED RINGERS IV SOLN
INTRAVENOUS | Status: DC | PRN
  Administered 2017-01-06 (×2): via INTRAVENOUS

## 2017-01-06 MED ORDER — MORPHINE SULFATE (PF) 4 MG/ML IV SOLN
1.0000 mg | INTRAVENOUS | Status: DC | PRN
  Administered 2017-01-07 – 2017-01-08 (×8): 1 mg via INTRAVENOUS
  Filled 2017-01-06 (×9): qty 1

## 2017-01-06 MED ORDER — ROPIVACAINE HCL 5 MG/ML IJ SOLN
INTRAMUSCULAR | Status: DC | PRN
  Administered 2017-01-06: 30 mL via PERINEURAL

## 2017-01-06 MED ORDER — PROMETHAZINE HCL 25 MG/ML IJ SOLN: 6.2500 mg | INTRAMUSCULAR | Status: DC | PRN

## 2017-01-06 MED ORDER — ALBUTEROL SULFATE (2.5 MG/3ML) 0.083% IN NEBU: 2.5000 mg | INHALATION_SOLUTION | Freq: Four times a day (QID) | RESPIRATORY_TRACT | Status: DC | PRN

## 2017-01-06 MED ORDER — GABAPENTIN 300 MG PO CAPS: 300.0000 mg | ORAL_CAPSULE | Freq: Three times a day (TID) | ORAL | Status: DC

## 2017-01-06 MED ORDER — LIDOCAINE HCL (CARDIAC) 20 MG/ML IV SOLN
INTRAVENOUS | Status: DC | PRN
  Administered 2017-01-06: 60 mg via INTRATRACHEAL

## 2017-01-06 MED ORDER — SODIUM CHLORIDE 0.9 % IV SOLN
INTRAVENOUS | Status: DC
  Administered 2017-01-06: 23:00:00 via INTRAVENOUS
  Administered 2017-01-07: 1 mL via INTRAVENOUS

## 2017-01-06 MED ORDER — HYDROMORPHONE HCL 1 MG/ML IJ SOLN
INTRAMUSCULAR | Status: AC
  Administered 2017-01-06: 0.5 mg via INTRAVENOUS
  Filled 2017-01-06: qty 1

## 2017-01-06 MED ORDER — PROPOFOL 10 MG/ML IV BOLUS
INTRAVENOUS | Status: DC | PRN
  Administered 2017-01-06: 150 mg via INTRAVENOUS

## 2017-01-06 MED ORDER — CLINDAMYCIN PHOSPHATE 900 MG/50ML IV SOLN: 900.0000 mg | INTRAVENOUS | Status: DC

## 2017-01-06 MED ORDER — ONDANSETRON HCL 4 MG/2ML IJ SOLN
4.0000 mg | Freq: Four times a day (QID) | INTRAMUSCULAR | Status: DC | PRN
  Filled 2017-01-06: qty 2

## 2017-01-06 MED ORDER — DOCUSATE SODIUM 100 MG PO CAPS: 100.0000 mg | ORAL_CAPSULE | Freq: Two times a day (BID) | ORAL | Status: DC

## 2017-01-06 MED ORDER — GABAPENTIN 300 MG PO CAPS
300.0000 mg | ORAL_CAPSULE | Freq: Three times a day (TID) | ORAL | Status: DC
  Administered 2017-01-06 – 2017-01-08 (×5): 300 mg via ORAL
  Filled 2017-01-06 (×6): qty 1

## 2017-01-06 MED ORDER — ACETAMINOPHEN 325 MG PO TABS
650.0000 mg | ORAL_TABLET | Freq: Four times a day (QID) | ORAL | Status: DC | PRN
  Administered 2017-01-06 – 2017-01-07 (×2): 650 mg via ORAL
  Filled 2017-01-06 (×2): qty 2

## 2017-01-06 SURGICAL SUPPLY — 63 items
BANDAGE ESMARK 6X9 LF (GAUZE/BANDAGES/DRESSINGS) ×1 IMPLANT
BIT DRILL 2.5X2.75 QC CALB (BIT) ×3 IMPLANT
BIT DRILL 2.9 CANN QC NONSTRL (BIT) ×3 IMPLANT
BIT DRILL 3.5X5.5 QC CALB (BIT) ×3 IMPLANT
BLADE SURG 15 STRL LF DISP TIS (BLADE) ×1 IMPLANT
BLADE SURG 15 STRL SS (BLADE) ×2
BNDG COHESIVE 4X5 TAN STRL (GAUZE/BANDAGES/DRESSINGS) ×3 IMPLANT
BNDG COHESIVE 6X5 TAN STRL LF (GAUZE/BANDAGES/DRESSINGS) ×3 IMPLANT
BNDG ESMARK 6X9 LF (GAUZE/BANDAGES/DRESSINGS) ×3
CANISTER SUCT 3000ML PPV (MISCELLANEOUS) ×3 IMPLANT
CHLORAPREP W/TINT 26ML (MISCELLANEOUS) ×3 IMPLANT
COVER SURGICAL LIGHT HANDLE (MISCELLANEOUS) ×3 IMPLANT
CUFF TOURNIQUET SINGLE 34IN LL (TOURNIQUET CUFF) IMPLANT
CUFF TOURNIQUET SINGLE 44IN (TOURNIQUET CUFF) ×3 IMPLANT
DRAPE OEC MINIVIEW 54X84 (DRAPES) ×3 IMPLANT
DRSG MEPITEL 4X7.2 (GAUZE/BANDAGES/DRESSINGS) ×3 IMPLANT
DRSG PAD ABDOMINAL 8X10 ST (GAUZE/BANDAGES/DRESSINGS) ×6 IMPLANT
ELECT REM PT RETURN 9FT ADLT (ELECTROSURGICAL) ×3
ELECTRODE REM PT RTRN 9FT ADLT (ELECTROSURGICAL) ×1 IMPLANT
GAUZE SPONGE 4X4 12PLY STRL (GAUZE/BANDAGES/DRESSINGS) ×3 IMPLANT
GLOVE BIO SURGEON STRL SZ8 (GLOVE) ×3 IMPLANT
GLOVE BIOGEL PI IND STRL 8 (GLOVE) ×1 IMPLANT
GLOVE BIOGEL PI INDICATOR 8 (GLOVE) ×2
GLOVE ECLIPSE 8.0 STRL XLNG CF (GLOVE) ×3 IMPLANT
GOWN STRL REUS W/ TWL LRG LVL3 (GOWN DISPOSABLE) ×1 IMPLANT
GOWN STRL REUS W/ TWL XL LVL3 (GOWN DISPOSABLE) ×2 IMPLANT
GOWN STRL REUS W/TWL LRG LVL3 (GOWN DISPOSABLE) ×2
GOWN STRL REUS W/TWL XL LVL3 (GOWN DISPOSABLE) ×4
K-WIRE ACE 1.6X6 (WIRE) ×6
KIT BASIN OR (CUSTOM PROCEDURE TRAY) ×3 IMPLANT
KIT ROOM TURNOVER OR (KITS) ×3 IMPLANT
KWIRE ACE 1.6X6 (WIRE) ×2 IMPLANT
NS IRRIG 1000ML POUR BTL (IV SOLUTION) ×3 IMPLANT
PACK ORTHO EXTREMITY (CUSTOM PROCEDURE TRAY) ×3 IMPLANT
PAD ARMBOARD 7.5X6 YLW CONV (MISCELLANEOUS) ×6 IMPLANT
PAD CAST 4YDX4 CTTN HI CHSV (CAST SUPPLIES) ×1 IMPLANT
PADDING CAST COTTON 4X4 STRL (CAST SUPPLIES) ×2
PADDING CAST COTTON 6X4 STRL (CAST SUPPLIES) ×3 IMPLANT
PLATE FIBULAR COMP LOCK 10H (Plate) ×3 IMPLANT
SCREW ACE CAN 4.0 40M (Screw) ×3 IMPLANT
SCREW CORTICAL 3.5MM 22MM (Screw) ×3 IMPLANT
SCREW CORTICAL LOW PROF 3.5X20 (Screw) ×12 IMPLANT
SCREW LOCK CORT STAR 3.5X10 (Screw) ×3 IMPLANT
SCREW LOCK CORT STAR 3.5X12 (Screw) ×3 IMPLANT
SCREW LOCK CORT STAR 3.5X14 (Screw) ×3 IMPLANT
SCREW LOCK CORT STAR 3.5X16 (Screw) ×3 IMPLANT
SCREW LOCK CORT STAR 3.5X22 (Screw) ×3 IMPLANT
SCREW LOW PROFILE 22MMX3.5MM (Screw) ×3 IMPLANT
SCREW NLOCK CANC HEX 4X50 (Screw) ×3 IMPLANT
SCREW NLOCK DIST FEM 4X7 (Screw) ×3 IMPLANT
SPLINT PLASTER CAST XFAST 5X30 (CAST SUPPLIES) ×1 IMPLANT
SPLINT PLASTER XFAST SET 5X30 (CAST SUPPLIES) ×2
SPONGE LAP 18X18 X RAY DECT (DISPOSABLE) ×3 IMPLANT
SUCTION FRAZIER HANDLE 10FR (MISCELLANEOUS) ×2
SUCTION TUBE FRAZIER 10FR DISP (MISCELLANEOUS) ×1 IMPLANT
SUT ETHILON 3 0 PS 1 (SUTURE) ×6 IMPLANT
SUT MNCRL AB 3-0 PS2 18 (SUTURE) ×3 IMPLANT
SUT VIC AB 2-0 CT1 27 (SUTURE) ×4
SUT VIC AB 2-0 CT1 TAPERPNT 27 (SUTURE) ×2 IMPLANT
TOWEL OR 17X24 6PK STRL BLUE (TOWEL DISPOSABLE) ×3 IMPLANT
TOWEL OR 17X26 10 PK STRL BLUE (TOWEL DISPOSABLE) ×3 IMPLANT
TUBE CONNECTING 12'X1/4 (SUCTIONS) ×1
TUBE CONNECTING 12X1/4 (SUCTIONS) ×2 IMPLANT

## 2017-01-06 NOTE — Anesthesia Postprocedure Evaluation (Signed)
Anesthesia Post Note  Patient: Diana Oneill  Procedure(s) Performed: Procedure(s) (LRB): OPEN REDUCTION INTERNAL FIXATION (ORIF) ANKLE FRACTURE (Right)     Patient location during evaluation: PACU Anesthesia Type: General and Regional Level of consciousness: awake and alert Pain management: pain level controlled Vital Signs Assessment: post-procedure vital signs reviewed and stable Respiratory status: spontaneous breathing, nonlabored ventilation, respiratory function stable and patient connected to nasal cannula oxygen Cardiovascular status: blood pressure returned to baseline and stable Postop Assessment: no signs of nausea or vomiting Anesthetic complications: no    Last Vitals:  Vitals:   01/06/17 1115 01/06/17 1154  BP: 100/70 99/68  Pulse: 84 88  Resp: 13 13  Temp: (!) 36.1 C (!) 36.3 C  SpO2: 91% 97%    Last Pain:  Vitals:   01/06/17 1154  TempSrc: Oral  PainSc: 3                  Ryan P Ellender

## 2017-01-06 NOTE — Op Note (Signed)
01/05/2017 - 01/06/2017  10:02 AM  PATIENT:  Diana Oneill  61 y.o. female  PRE-OPERATIVE DIAGNOSIS:  Right ankle trimal fracture  POST-OPERATIVE DIAGNOSIS:  same  Procedure(s): 1.  Open treatment of right ankle trimalleolar fracture with internal fixation without fixation of the posterior lip 2.  AP, mortise and lateral xrays of the right ankle  SURGEON:  Wylene Simmer, MD  ASSISTANT:  none  ANESTHESIA:   General, regional  EBL:  minimal   TOURNIQUET:   Total Tourniquet Time Documented: Thigh (Right) - 73 minutes Total: Thigh (Right) - 73 minutes  COMPLICATIONS:  None apparent  DISPOSITION:  Extubated, awake and stable to recovery.  INDICATION FOR PROCEDURE:  The patient is a 61 year old female who is in the postoperative period for a total knee replacement done about 10 weeks ago. She fell 2 weeks ago injuring her right ankle. She was admitted at [facility] with a trimalleolar fracture. She was sent to a skilled nursing facility and then seen in clinic by Dr. Lyla Glassing. She was admitted yesterday and presents now for operative treatment of this displaced and unstable right ankle trimalleolar fracture.  The risks and benefits of the alternative treatment options have been discussed in detail.  The patient wishes to proceed with surgery and specifically understands risks of bleeding, infection, nerve damage, blood clots, need for additional surgery, amputation and death.   PROCEDURE IN DETAIL:After pre operative consent was obtained, and the correct operative site was identified, the patient was brought to the operating room and placed supine on the OR table.  Anesthesia was administered.  Pre-operative antibiotics were administered.  A surgical timeout was taken. The right lower extremity was prepped and draped in standard sterile fashion with a tourniquet around the thigh. The extremity was exsanguinated and the tourniquet was inflated to 350 mm mercury. The  patient had a previous posterolateral incision on the ankle. This incision was opened again sharply and dissection was carried down through the subtenon's tissues to the level of the fibula fracture. Dissection was carried proximally and distally exposing the fibula adequately. The fracture was reduced and held with a lobster claw after cleaning out all periosteum and hematoma. 3.5 mm fully threaded lag screw was inserted from posterior to anterior across the fracture site. A 10 hole Biomet Alps plate was selected. It was secured proximally with a nonlocking screw and distally with a nonlocking screw. Radiographs confirmed appropriate position of the plate and appropriate reduction of the fracture. The plate was then further secured with locking screws distally and nonlocking screws proximally.  Attention was turned to the medial aspect the ankle where a longitudinal incision was made over the medial malleolus fracture site. Dissection was carried down through the subtenon's tissues to the fracture site. Fracture was cleaned of all hematoma. He was noted to be severely comminuted posterior medially. Fracture was reduced and held provisionally with a pointed tenaculum. 2 K wires were inserted across the fracture site from the medial malleolus. AP and lateral ray grafts confirmed appropriate reduction of the fracture and appropriate position of both K wires. The anterior K wire was overdrilled and a 4 mm partially-threaded cannulated screw was inserted. This compressed the fracture site appropriately. The posterior wire was overdrilled and removed. A 4 mm fully threaded solid screw was inserted. It was noted to have excellent purchase. The anterior screw was then removed and replaced with a fully threaded 4 mm solid screw.  Final AP, mortise and lateral radiographs showed appropriate position  and length of all hardware and appropriate reduction of the fractures. Posterior malleolus fracture was small and did not  involve a significant portion of the articular surface. It was adequately reduced, so decision was made to not fix this fracture fragment. Medial and lateral wounds were then irrigated copiously. Deep subcutaneous tissues were approximated with Monocryl and Vicryl and skin incisions were closed with nylon sterile dressings were applied followed by a well-padded short leg splint. Tourniquet was released after application of the dressings at just over one hour. The patient was awakened from anesthesia and transported to the recovery room in stable condition.   FOLLOW UP PLAN: Nonweightbearing on the right lower extremity. Physical therapy and occupational therapy consults. Social work for placement back in her skilled nursing facility. Aspirin 81 mg by mouth twice a day for DVT prophylaxis.   RADIOGRAPHS: AP, mortise and lateral radiographs of the right ankle are obtained intraoperatively. These show interval reduction and fixation of medial and lateral malleolus fractures. Hardware is appropriately positioned and of the appropriate lengths. No other acute injuries are noted.

## 2017-01-06 NOTE — Progress Notes (Signed)
Pt to OR per Bed.  Pt denies pain or SOB. Right leg maintained elevated.

## 2017-01-06 NOTE — H&P (Signed)
Diana Oneill is an 61 y.o. female.   Chief Complaint:  Right ankle fracture HPI:  61 y/o female with right ankle trimal fracture.  She underwent right total knee replacement about 2.5 months ago.  She fell at home injuring her right ankel two weeks ago.  She was admitted at [facility] and then sent to a SNF.  She presents today for ORIF of this unstable and displaced right ankle fracture.  Past Medical History:  Diagnosis Date  . Acute kidney injury ([place])   . Anemia   . Anxiety   . Arthritis   . Cancer ([place])    skin cancer, uterine cancer  . COPD (chronic obstructive pulmonary disease) ([place])   . Depression   . Dyspnea    with exertion  . History of kidney stones   . Hypertension   . Hyponatremia   . Osteoarthritis   . Sleep apnea    does not use CPAP "Makes too much Noise"  . Thrombocytopenia ([place])   . Thrombocytopenia ([place]) 09/2016    Past Surgical History:  Procedure Laterality Date  . ABDOMINAL HYSTERECTOMY    . ACHILLES TENDON REPAIR Left   . GASTRIC BYPASS    . JOINT REPLACEMENT Right    knee replace  . KNEE ARTHROPLASTY Left 10/23/2016   Procedure: COMPUTER ASSISTED TOTAL KNEE ARTHROPLASTY;  Surgeon: Rod Can, MD;  Location: [facility];  Service: Orthopedics;  Laterality: Left;    No family history on file. Social History:  reports that she quit smoking about 5 months ago. Her smoking use included Cigarettes. She has a 40.00 pack-year smoking history. She has never used smokeless tobacco. She reports that she uses drugs. She reports that she does not drink alcohol.  Allergies:  Allergies  Allergen Reactions  . Penicillins Anaphylaxis    Has patient had a PCN reaction causing immediate rash, facial/tongue/throat swelling, SOB or lightheadedness with hypotension: Yes Has patient had a PCN reaction causing severe rash involving mucus membranes or skin necrosis: No Has patient had a PCN reaction that required hospitalization: No Has patient had a PCN  reaction occurring within the last 10 years: No If all of the above answers are "NO", then may proceed with Cephalosporin use.  . Prozac [Fluoxetine Hcl] Hives and Swelling    Facial swelling    Medications Prior to Admission  Medication Sig Dispense Refill  . albuterol (ACCUNEB) 0.63 MG/3ML nebulizer solution Take 1 ampule by nebulization every 6 (six) hours as needed for wheezing.    Marland Kitchen aspirin 81 MG chewable tablet Chew 1 tablet (81 mg total) by mouth 2 (two) times daily. 60 tablet 1  . citalopram (CELEXA) 20 MG tablet Take 40 mg by mouth daily.    . clonazePAM (KLONOPIN) 0.5 MG tablet Take 0.5 mg by mouth every 8 (eight) hours.     . cyclobenzaprine (FLEXERIL) 5 MG tablet Take 5 mg by mouth at bedtime.    . divalproex (DEPAKOTE) 500 MG DR tablet Take 500 mg by mouth 2 (two) times daily.    Marland Kitchen docusate sodium (COLACE) 100 MG capsule Take 1 capsule (100 mg total) by mouth 2 (two) times daily. 60 capsule 1  . gabapentin (NEURONTIN) 300 MG capsule Take 300-600 mg by mouth every 8 (eight) hours.     Marland Kitchen HYDROmorphone (DILAUDID) 2 MG tablet Take 1 tablet (2 mg total) by mouth every 4 (four) hours as needed for severe pain. 60 tablet 0  . lisinopril (PRINIVIL,ZESTRIL) 10 MG tablet Take 10 mg  by mouth daily.    . Lurasidone HCl (LATUDA) 120 MG TABS Take 120 mg by mouth daily.    . ondansetron (ZOFRAN) 4 MG tablet Take 1 tablet (4 mg total) by mouth every 6 (six) hours as needed for nausea. 20 tablet 0  . senna (SENOKOT) 8.6 MG TABS tablet Take 2 tablets (17.2 mg total) by mouth at bedtime. 120 each 0    Results for orders placed or performed during the hospital encounter of 01/05/17 (from the past 48 hour(s))  Surgical pcr screen     Status: None   Collection Time: 01/05/17  1:54 PM  Result Value Ref Range   MRSA, PCR NEGATIVE NEGATIVE   Staphylococcus aureus NEGATIVE NEGATIVE    Comment: (NOTE) The Xpert SA Assay (FDA approved for NASAL specimens in patients 13 years of age and older), is one  component of a comprehensive surveillance program. It is not intended to diagnose infection nor to guide or monitor treatment.   HIV antibody (Routine Testing)     Status: None   Collection Time: 01/05/17  2:55 PM  Result Value Ref Range   HIV Screen 4th Generation wRfx Non Reactive Non Reactive    Comment: (NOTE) Performed At: [facility] [place], [place] [id number] Lindon Romp MD LG:[id number]   CBC     Status: Abnormal   Collection Time: 01/05/17  2:55 PM  Result Value Ref Range   WBC 9.8 4.0 - 10.5 K/uL   RBC 3.22 (L) 3.87 - 5.11 MIL/uL   Hemoglobin 8.8 (L) 12.0 - 15.0 g/dL   HCT 26.8 (L) 36.0 - 46.0 %   MCV 83.2 78.0 - 100.0 fL   MCH 27.3 26.0 - 34.0 pg   MCHC 32.8 30.0 - 36.0 g/dL   RDW 14.2 11.5 - 15.5 %   Platelets 362 150 - 400 K/uL  Protime-INR     Status: None   Collection Time: 01/05/17  2:55 PM  Result Value Ref Range   Prothrombin Time 13.0 11.4 - 15.2 seconds   INR 0.99   Type and screen [place]     Status: None   Collection Time: 01/05/17  2:55 PM  Result Value Ref Range   ABO/RH(D) B POS    Antibody Screen NEG    Sample Expiration 01/08/2017    No results found.  ROS  No recent f/c/n/v/wt loss  Blood pressure (!) 119/59, pulse 76, temperature 97.8 F (36.6 C), temperature source Oral, resp. rate 18, height 5\' 6"  (1.676 m), weight 104 kg (229 lb 4.8 oz), SpO2 97 %. Physical Exam  wn wd woman in nad.  A and o x4.  Mood and affect normal.  EOMi.  resp unlabored.  R ankle with mild swelling.  Wiggles toes.  Brisk cap refill at the toes.  No lymphadenopathy.  5/5 strength in PF and DF of the ankle and toes.  Sens to LT intact at the forefoot.  Assessment/Plan R ankle trimal fracture - to OR for ORIF.  The risks and benefits of the alternative treatment options have been discussed in detail.  The patient wishes to proceed with surgery and specifically understands risks of bleeding, infection, nerve damage,  blood clots, need for additional surgery, amputation and death.   Wylene Simmer, MD January 20, 2017, 7:36 AM

## 2017-01-06 NOTE — Anesthesia Procedure Notes (Signed)
Procedure Name: LMA Insertion Date/Time: 01/06/2017 8:12 AM Performed by: Izora Gala Pre-anesthesia Checklist: Patient identified, Emergency Drugs available, Suction available and Patient being monitored Patient Re-evaluated:Patient Re-evaluated prior to induction Oxygen Delivery Method: Circle system utilized Preoxygenation: Pre-oxygenation with 100% oxygen Induction Type: IV induction Ventilation: Mask ventilation without difficulty LMA: LMA inserted LMA Size: 4.0 Number of attempts: 1 Placement Confirmation: positive ETCO2 Tube secured with: Tape Dental Injury: Teeth and Oropharynx as per pre-operative assessment

## 2017-01-06 NOTE — Anesthesia Procedure Notes (Signed)
Anesthesia Regional Block: Popliteal block   Pre-Anesthetic Checklist: ,, timeout performed, Correct Patient, Correct Site, Correct Laterality, Correct Procedure,, site marked, risks and benefits discussed, Surgical consent,  Pre-op evaluation,  At surgeon's request and post-op pain management  Laterality: Right  Prep: chloraprep       Needles:  Injection technique: Single-shot  Needle Type: Echogenic Stimulator Needle     Needle Length: 9cm  Needle Gauge: 21     Additional Needles:   Procedures: ultrasound guided,,,,,,,,  Narrative:  Start time: 01/06/2017 7:35 AM End time: 01/06/2017 7:45 AM Injection made incrementally with aspirations every 5 mL.  Performed by: Personally  Anesthesiologist: Adele Barthel P  Additional Notes: Functioning IV was confirmed and monitors were applied.  A 59mm 21ga Arrow echogenic stimulator needle was used. Sterile prep, hand hygiene and sterile gloves were used.  Negative aspiration and negative test dose prior to incremental administration of local anesthetic. The patient tolerated the procedure well.

## 2017-01-06 NOTE — Transfer of Care (Signed)
Immediate Anesthesia Transfer of Care Note  Patient: Diana Oneill  Procedure(s) Performed: Procedure(s): OPEN REDUCTION INTERNAL FIXATION (ORIF) ANKLE FRACTURE (Right)  Patient Location: PACU  Anesthesia Type:General and Regional  Level of Consciousness: awake and sedated  Airway & Oxygen Therapy: Patient Spontanous Breathing and Patient connected to nasal cannula oxygen  Post-op Assessment: Report given to RN, Post -op Vital signs reviewed and stable, Patient moving all extremities and Patient moving all extremities X 4  Post vital signs: Reviewed and stable  Last Vitals:  Vitals:   01/05/17 2127 01/06/17 0519  BP: 112/78 (!) 119/59  Pulse: (!) 101 76  Resp: 18 18  Temp: 36.8 C 36.6 C  SpO2: 99% 97%    Last Pain:  Vitals:   01/06/17 0519  TempSrc: Oral  PainSc:       Patients Stated Pain Goal: 2 (92/11/94 1740)  Complications: No apparent anesthesia complications

## 2017-01-07 NOTE — Evaluation (Addendum)
Occupational Therapy Evaluation Patient Details Name: Diana Oneill MRN: [id number] DOB: October 02, 1955 Today's Date: 01/07/2017    History of Present Illness 61 y.o. female with right ankle trimal fracture.  She underwent left total knee replacement about 2.5 months ago.  She fell at home injuring her right ankle two weeks ago.  She was admitted at [facility] and then sent to a SNF.  She presents s/p right ankle ORIF 01-06-17. PMH includes anemia, arthritis, anxiety, cancer, COPD, depression, dyspnea, OA, Hyponatremia, HTN.   Clinical Impression   Pt s/p above. Feel pt will benefit from acute OT to increase independence prior to d/c. Recommending SNF for rehab prior to d/c home.    Follow Up Recommendations  SNF    Equipment Recommendations  Other (comment) (defer to next venue)    Recommendations for Other Services       Precautions / Restrictions Precautions Precautions: Fall Restrictions Weight Bearing Restrictions: Yes RLE Weight Bearing: Non weight bearing      Mobility Bed Mobility Overal bed mobility: Needs Assistance Bed Mobility: Supine to Sit     Supine to sit: HOB elevated;Min assist     General bed mobility comments: pt needed min assist and verbal cues to scoot to the edge of the bed.  pt managed right leg   Transfers Overall transfer level: Needs assistance Equipment used:  (sliding board) Transfers: Lateral/Scoot Transfers          Lateral/Scoot Transfers: +2 physical assistance;With slide board;From elevated surface;Max assist (+2 Mod-Max A) General transfer comment: pt started out with +2 mod assist - but then buttocks stuck to board and needed +2 max to get situated into the chair.  pt needed cues on technque - hand placement and which way to lean to scoot more easily.  pt able to back up in the chair by leaning side to side and scooting..  pt exhausted once in the chair    Balance                                            ADL either performed or assessed with clinical judgement   ADL Overall ADL's : Needs assistance/impaired                     Lower Body Dressing: Bed level;Moderate assistance   Toilet Transfer: +2 for physical assistance;Maximal assistance;Transfer board (from bed to chair)           Functional mobility during ADLs: +2 for physical assistance;Maximal assistance (sliding board transfer) General ADL Comments: Educated on LB dressing technique.      Vision         Perception     Praxis      Pertinent Vitals/Pain Pain Assessment: 0-10 Pain Score:  (6 prior to sliding board transfer and 9 afterwards) Pain Location: right ankle Pain Descriptors / Indicators: Constant Pain Intervention(s): Monitored during session;Repositioned     Hand Dominance     Extremity/Trunk Assessment Upper Extremity Assessment Upper Extremity Assessment: Generalized weakness (weakness in bilateral shoulder flexors)   Lower Extremity Assessment Lower Extremity Assessment: Defer to PT evaluation RLE Deficits / Details: right ankle with fixed cast on -  pt able to wiggle toes.  quad 3/5.   LLE Deficits / Details: left leg - quad grossly 3+/5 - not strong enough to soley lift pts body weight   Cervical /  Trunk Assessment Cervical / Trunk Assessment: Normal   Communication Communication Communication: No difficulties   Cognition Arousal/Alertness: Awake/alert Behavior During Therapy: WFL for tasks assessed/performed Overall Cognitive Status:  (unsure of baseline cognition)                                 General Comments: pts significant other present during eval   General Comments  pt did well sitting on EOB.  when pt doing sliding board she was getting tired and wanting to lean back in chair too early - needed cues for trunk control for optimal scooting    Exercises  talked about UE exercises she can be doing.   Shoulder Instructions      Home Living  Family/patient expects to be discharged to:: Skilled nursing facility                                 Additional Comments: Unsure of pt's accuracy of information- states she was at SNF doing sliding board and parallel bars with her broken ankle prior to surgery. Later, it sounded as if she was at home?      Prior Functioning/Environment Level of Independence: Needs assistance  Gait / Transfers Assistance Needed: walking with cane  ADL's / Homemaking Assistance Needed: assist with bathing and dressing            OT Problem List: Decreased strength;Decreased range of motion;Decreased activity tolerance;Pain;Obesity;Decreased knowledge of precautions;Decreased knowledge of use of DME or AE;Decreased cognition      OT Treatment/Interventions: Self-care/ADL training;Therapeutic exercise;Energy conservation;DME and/or AE instruction;Therapeutic activities;Cognitive remediation/compensation;Patient/family education;Balance training    OT Goals(Current goals can be found in the care plan section) Acute Rehab OT Goals Patient Stated Goal: to be able to walk again OT Goal Formulation: With patient Time For Goal Achievement: 01/14/17 Potential to Achieve Goals: Good ADL Goals Pt Will Perform Lower Body Dressing: with min assist;sitting/lateral leans Pt Will Transfer to Toilet: with mod assist;with transfer board (drop arm commode) Pt Will Perform Toileting - Clothing Manipulation and hygiene: sitting/lateral leans;with min assist Additional ADL Goal #1: Pt will independently perform bilateral UE exercises to increase strength.  OT Frequency: Min 2X/week   Barriers to D/C:            Co-evaluation PT/OT/SLP Co-Evaluation/Treatment: Yes Reason for Co-Treatment: For patient/therapist safety   OT goals addressed during session: ADL's and self-care;Other (comment) (mobility)      AM-PAC PT "6 Clicks" Daily Activity     Outcome Measure Help from another person eating  meals?: None Help from another person taking care of personal grooming?: A Little Help from another person toileting, which includes using toliet, bedpan, or urinal?: Total Help from another person bathing (including washing, rinsing, drying)?: A Lot Help from another person to put on and taking off regular upper body clothing?: A Little Help from another person to put on and taking off regular lower body clothing?: A Lot 6 Click Score: 15   End of Session Equipment Utilized During Treatment: Other (comment) (sliding board, Oxygen placed back on pt at end of session) Nurse Communication: Mobility status;Need for lift equipment  Activity Tolerance: Patient tolerated treatment well (became tired) Patient left: in chair;with call bell/phone within reach;with family/visitor present  OT Visit Diagnosis: Pain Pain - Right/Left: Right Pain - part of body:  (ankle)  Time: 0630-1601 OT Time Calculation (min): 35 min Charges:  OT General Charges $OT Visit: 1 Visit OT Evaluation $OT Eval Moderate Complexity: 1 Mod G-Codes:       Shekia Kuper L Macintyre Alexa OTR/L 01/07/2017, 11:06 AM

## 2017-01-07 NOTE — Progress Notes (Signed)
Patient ID: Diana Oneill, female   DOB: 1955-09-02, 61 y.o.   MRN: [id number] Subjective: 1 Day Post-Op Procedure(s) (LRB): OPEN REDUCTION INTERNAL FIXATION (ORIF) ANKLE FRACTURE (Right)    Patient reports pain as moderate. No acute events.  Awaiting transfer to SNF per patient  Objective:   VITALS:   Vitals:   01/07/17 0243 01/07/17 0536  BP: 100/62 123/64  Pulse: 62 67  Resp: 16 17  Temp: 97.9 F (36.6 C) 98.2 F (36.8 C)  SpO2: 98% 94%    Neurovascular intact Incision: dressing C/D/I  LABS  Recent Labs  01/05/17 1455  HGB 8.8*  HCT 26.8*  WBC 9.8  PLT 362     Recent Labs  01/06/17 2118  CREATININE 0.96     Recent Labs  01/05/17 1455  INR 0.99     Assessment/Plan: 1 Day Post-Op Procedure(s) (LRB): OPEN REDUCTION INTERNAL FIXATION (ORIF) ANKLE FRACTURE (Right)   Advance diet Up with therapy Discharge to SNF probably Monday

## 2017-01-07 NOTE — Evaluation (Signed)
Physical Therapy Evaluation Patient Details Name: Diana Oneill MRN: [id number] DOB: January 26, 1956 Today's Date: 01/07/2017   History of Present Illness  61 y/o female with right ankle trimal fracture.  She underwent left total knee replacement about 2.5 months ago.  She fell at home injuring her right ankle two weeks ago.  She was admitted at [facility] and then sent to a SNF.  She presents today for right ankle ORIF 01-06-17.  Clinical Impression  Pt did sliding board with +2 mod to max assist.  She is NWB right foot and weak in arms and left leg (recent TKR).  Pt will benefit from placement due to +2 assist for mobilty.  Pt needs skilled PT services to progress but anticipate slow recovery    Follow Up Recommendations DC plan and follow up therapy as arranged by surgeon;SNF;Supervision/Assistance - 24 hour    Equipment Recommendations  None recommended by PT    Recommendations for Other Services       Precautions / Restrictions Precautions Precautions: Fall Restrictions Weight Bearing Restrictions: Yes RLE Weight Bearing: Non weight bearing      Mobility  Bed Mobility Overal bed mobility: Needs Assistance Bed Mobility: Supine to Sit     Supine to sit: HOB elevated;Min assist     General bed mobility comments: pt needed min assist and verbal cues to scoot to the edge of the bed.  pt managed right leg   Transfers Overall transfer level: Needs assistance Equipment used:  (sliding board) Transfers: Lateral/Scoot Transfers          Lateral/Scoot Transfers: +2 physical assistance;With slide board;From elevated surface General transfer comment: pt started out with +2 mod assist - but then buttocks stuck to board and needed +2 max to get situated into the chair.  pt needed cues on technque - hand placement and which way to lean to scoot more easily.  pt able to back up in the chair by leaning side to side and scooting..  pt exhausted once in the chair  Ambulation/Gait                 Stairs            Wheelchair Mobility    Modified Rankin (Stroke Patients Only)       Balance                                             Pertinent Vitals/Pain Pain Assessment: 0-10 Pain Score:  (6/10 prior to sliding board transfer and 9/10 after) Pain Location: right ankle Pain Descriptors / Indicators: Constant;Aching;Operative site guarding;Burning Pain Intervention(s): Limited activity within patient's tolerance;Monitored during session;Repositioned;Patient requesting pain meds-RN notified (elevation on pillow)    Home Living Family/patient expects to be discharged to:: Skilled nursing facility                 Additional Comments: Pt was at SNF doing sliding board and parallel bars with her broken ankle prior to surgery.      Prior Function Level of Independence: Needs assistance   Gait / Transfers Assistance Needed: pts left knee had replacement 10 weeks ago and is still weak -she was walking with cane in right hand prior to fall that broke her ankle           Hand Dominance        Extremity/Trunk Assessment   Upper  Extremity Assessment Upper Extremity Assessment: Defer to OT evaluation    Lower Extremity Assessment Lower Extremity Assessment: RLE deficits/detail;LLE deficits/detail RLE Deficits / Details: right ankle with fixed cast on -  pt able to wiggle toes.  quad 3/5.   LLE Deficits / Details: left leg - quad grossly 3+/5 - not strong enough to soley lift pts body weight    Cervical / Trunk Assessment Cervical / Trunk Assessment: Normal  Communication   Communication: No difficulties  Cognition Arousal/Alertness: Awake/alert Behavior During Therapy: WFL for tasks assessed/performed Overall Cognitive Status: Within Functional Limits for tasks assessed                                 General Comments: pts significant other present during eval      General Comments General  comments (skin integrity, edema, etc.): pt did well sitting on EOB.  when pt doing sliding board she was getting tired and wanting to lean back in chair too early - needed cues for trunk control for optimal scooting    Exercises     Assessment/Plan    PT Assessment Patient needs continued PT services  PT Problem List Decreased strength;Decreased activity tolerance;Decreased balance;Decreased mobility;Decreased knowledge of use of DME;Decreased safety awareness;Decreased knowledge of precautions;Obesity;Pain       PT Treatment Interventions DME instruction;Functional mobility training;Therapeutic activities;Therapeutic exercise;Balance training;Patient/family education;Wheelchair mobility training    PT Goals (Current goals can be found in the Care Plan section)  Acute Rehab PT Goals Patient Stated Goal: to be able to walk again PT Goal Formulation: With patient/family Time For Goal Achievement: 01/21/17 Potential to Achieve Goals: Fair    Frequency Min 3X/week   Barriers to discharge Inaccessible home environment;Decreased caregiver support pt needs +2 assist for mobilty and frequent skilled interventions    Co-evaluation PT/OT/SLP Co-Evaluation/Treatment: Yes             AM-PAC PT "6 Clicks" Daily Activity  Outcome Measure Difficulty turning over in bed (including adjusting bedclothes, sheets and blankets)?: A Lot Difficulty moving from lying on back to sitting on the side of the bed? : A Lot Difficulty sitting down on and standing up from a chair with arms (e.g., wheelchair, bedside commode, etc,.)?: Unable Help needed moving to and from a bed to chair (including a wheelchair)?: Total Help needed walking in hospital room?: Total Help needed climbing 3-5 steps with a railing? : Total 6 Click Score: 8    End of Session Equipment Utilized During Treatment:  (sliding board) Activity Tolerance: Patient limited by fatigue;Patient limited by pain;Treatment limited secondary  to medical complications (Comment) (pt kept wanting to use right leg during transfer - had to physically assist her to keep right foot off the ground and not weight bearing) Patient left: in chair;with family/visitor present;with call bell/phone within reach Nurse Communication: Mobility status;Need for lift equipment;Patient requests pain meds;Weight bearing status PT Visit Diagnosis: Muscle weakness (generalized) (M62.81);History of falling (Z91.81);Pain Pain - Right/Left: Right Pain - part of body: Ankle and joints of foot    Time: 0930-1000 PT Time Calculation (min) (ACUTE ONLY): 30 min   Charges:   PT Evaluation $PT Eval Moderate Complexity: 1 Mod PT Treatments $Therapeutic Activity: 8-22 mins   PT G Codes:        January 13, 2017   Rande Lawman, PT   Loyal Buba January 13, 2017, 10:23 AM

## 2017-01-08 DIAGNOSIS — E872 Acidosis: Secondary | ICD-10-CM | POA: Diagnosis not present

## 2017-01-08 DIAGNOSIS — R339 Retention of urine, unspecified: Secondary | ICD-10-CM | POA: Diagnosis not present

## 2017-01-08 DIAGNOSIS — I10 Essential (primary) hypertension: Secondary | ICD-10-CM | POA: Diagnosis not present

## 2017-01-08 DIAGNOSIS — Z915 Personal history of self-harm: Secondary | ICD-10-CM | POA: Diagnosis not present

## 2017-01-08 DIAGNOSIS — D696 Thrombocytopenia, unspecified: Secondary | ICD-10-CM | POA: Diagnosis not present

## 2017-01-08 DIAGNOSIS — M6281 Muscle weakness (generalized): Secondary | ICD-10-CM | POA: Diagnosis not present

## 2017-01-08 DIAGNOSIS — S8291XD Unspecified fracture of right lower leg, subsequent encounter for closed fracture with routine healing: Secondary | ICD-10-CM | POA: Diagnosis not present

## 2017-01-08 DIAGNOSIS — J431 Panlobular emphysema: Secondary | ICD-10-CM | POA: Diagnosis not present

## 2017-01-08 DIAGNOSIS — R0789 Other chest pain: Secondary | ICD-10-CM | POA: Diagnosis not present

## 2017-01-08 DIAGNOSIS — M84671D Pathological fracture in other disease, right ankle, subsequent encounter for fracture with routine healing: Secondary | ICD-10-CM | POA: Diagnosis not present

## 2017-01-08 DIAGNOSIS — F319 Bipolar disorder, unspecified: Secondary | ICD-10-CM | POA: Diagnosis not present

## 2017-01-08 DIAGNOSIS — S92909A Unspecified fracture of unspecified foot, initial encounter for closed fracture: Secondary | ICD-10-CM | POA: Diagnosis not present

## 2017-01-08 DIAGNOSIS — Z471 Aftercare following joint replacement surgery: Secondary | ICD-10-CM | POA: Diagnosis not present

## 2017-01-08 DIAGNOSIS — F339 Major depressive disorder, recurrent, unspecified: Secondary | ICD-10-CM | POA: Diagnosis not present

## 2017-01-08 DIAGNOSIS — Z23 Encounter for immunization: Secondary | ICD-10-CM | POA: Diagnosis not present

## 2017-01-08 DIAGNOSIS — Z96652 Presence of left artificial knee joint: Secondary | ICD-10-CM | POA: Diagnosis not present

## 2017-01-08 DIAGNOSIS — Z4789 Encounter for other orthopedic aftercare: Secondary | ICD-10-CM | POA: Diagnosis not present

## 2017-01-08 DIAGNOSIS — G92 Toxic encephalopathy: Secondary | ICD-10-CM | POA: Diagnosis not present

## 2017-01-08 DIAGNOSIS — E875 Hyperkalemia: Secondary | ICD-10-CM | POA: Diagnosis not present

## 2017-01-08 DIAGNOSIS — N178 Other acute kidney failure: Secondary | ICD-10-CM | POA: Diagnosis not present

## 2017-01-08 DIAGNOSIS — J449 Chronic obstructive pulmonary disease, unspecified: Secondary | ICD-10-CM | POA: Diagnosis not present

## 2017-01-08 DIAGNOSIS — G8911 Acute pain due to trauma: Secondary | ICD-10-CM | POA: Diagnosis not present

## 2017-01-08 DIAGNOSIS — F1411 Cocaine abuse, in remission: Secondary | ICD-10-CM | POA: Diagnosis not present

## 2017-01-08 DIAGNOSIS — S82851D Displaced trimalleolar fracture of right lower leg, subsequent encounter for closed fracture with routine healing: Secondary | ICD-10-CM | POA: Diagnosis not present

## 2017-01-08 DIAGNOSIS — F3175 Bipolar disorder, in partial remission, most recent episode depressed: Secondary | ICD-10-CM | POA: Diagnosis not present

## 2017-01-08 DIAGNOSIS — B379 Candidiasis, unspecified: Secondary | ICD-10-CM | POA: Diagnosis not present

## 2017-01-08 DIAGNOSIS — I119 Hypertensive heart disease without heart failure: Secondary | ICD-10-CM | POA: Diagnosis not present

## 2017-01-08 DIAGNOSIS — F419 Anxiety disorder, unspecified: Secondary | ICD-10-CM | POA: Diagnosis not present

## 2017-01-08 NOTE — [place] FL2 (Signed)
[place] MEDICAID FL2 LEVEL OF CARE SCREENING TOOL     IDENTIFICATION  Patient Name: Diana Oneill Birthdate: 06/01/1955 Sex: female Admission Date (Current Location): 01/05/2017  [facility] and [place] Number:  Herbalist and Address:  [facility] [place] [address] [place], [place] [address]      Provider Number: [id number]  Attending Physician Name and Address:  Rod Can, MD  Relative Name and Phone Number:       Current Level of Care: Hospital Recommended Level of Care: [place] Prior Approval Number:    Date Approved/Denied:   PASRR Number:  [id number]  - End date 03/28/2017  Discharge Plan: SNF    Current Diagnoses: Patient Active Problem List   Diagnosis Date Noted  . Closed displaced trimalleolar fracture of right ankle 01/05/2017  . Acute encephalopathy 10/25/2016  . Essential hypertension 10/25/2016  . Obesity 10/25/2016  . Acidosis 10/25/2016  . Nausea without vomiting 10/25/2016  . Tachycardia 10/25/2016  . COPD (chronic obstructive pulmonary disease) ([place]) 10/25/2016  . Acute respiratory failure ([place]) 10/25/2016  . Hyponatremia   . Acute kidney injury ([place])   . Anemia   . Thrombocytopenia ([place])   . Osteoarthritis of left knee 10/23/2016    Orientation RESPIRATION BLADDER Height & Weight     Self, Time, Situation, Place  Normal Indwelling catheter Weight: 229 lb 4.8 oz (104 kg) Height:  5\' 6"  (167.6 cm)  BEHAVIORAL SYMPTOMS/MOOD NEUROLOGICAL BOWEL NUTRITION STATUS      Continent Diet (Regular Diet with Thin Liquids)  AMBULATORY STATUS COMMUNICATION OF NEEDS Skin   Extensive Assist Verbally Surgical wounds (Right Ankle Incision)                       Personal Care Assistance Level of Assistance  Bathing, Feeding, Dressing Bathing Assistance: Limited assistance Feeding assistance: Independent Dressing Assistance: Limited assistance     Functional Limitations Info  Hearing,  Sight, Speech Sight Info: Adequate Hearing Info: Adequate Speech Info: Adequate    SPECIAL CARE FACTORS FREQUENCY  PT (By licensed PT), OT (By licensed OT)     PT Frequency: 3x week OT Frequency: 2x week            Contractures Contractures Info: Not present    Additional Factors Info  Code Status, Allergies, Psychotropic Code Status Info: Full Code Allergies Info: Penicillins, Prozac Fluoxetine Hcl Psychotropic Info: Celexa, Klonopin, Depakote         Current Medications (01/08/2017):  This is the current hospital active medication list Current Facility-Administered Medications  Medication Dose Route Frequency Provider Last Rate Last Dose  . 0.9 %  sodium chloride infusion   Intravenous Continuous Corky Sing, [place] 75 mL/hr at 01/07/17 2134 1 mL at 01/07/17 2134  . acetaminophen (TYLENOL) tablet 650 mg  650 mg Oral Q6H PRN Corky Sing, PA-C   650 mg at 01/07/17 1031   Or  . acetaminophen (TYLENOL) suppository 650 mg  650 mg Rectal Q6H PRN Corky Sing, PA-C      . albuterol (PROVENTIL) (2.5 MG/3ML) 0.083% nebulizer solution 2.5 mg  2.5 mg Nebulization Q6H PRN Swinteck, Aaron Edelman, MD      . citalopram (CELEXA) tablet 40 mg  40 mg Oral Daily Corky Sing, PA-C   40 mg at 01/07/17 1031  . clonazePAM (KLONOPIN) tablet 0.5 mg  0.5 mg Oral Q8H Ollis, Justin Pike, PA-C      . diphenhydrAMINE (BENADRYL) 12.5 MG/5ML  elixir 12.5-25 mg  12.5-25 mg Oral Q4H PRN Corky Sing, PA-C      . divalproex (DEPAKOTE) DR tablet 500 mg  500 mg Oral BID Corky Sing, PA-C   500 mg at 01/07/17 2127  . enoxaparin (LOVENOX) injection 40 mg  40 mg Subcutaneous Q24H Corky Sing, PA-C   40 mg at 01/08/17 0815  . gabapentin (NEURONTIN) capsule 300 mg  300 mg Oral TID Rod Can, MD   300 mg at 01/07/17 2127  . lisinopril (PRINIVIL,ZESTRIL) tablet 10 mg  10 mg Oral Daily Corky Sing, PA-C   10 mg at 01/07/17 1031  . morphine 4 MG/ML injection 1 mg  1 mg  Intravenous Q2H PRN Rod Can, MD   1 mg at 01/08/17 0505  . ondansetron (ZOFRAN) tablet 4 mg  4 mg Oral Q6H PRN Corky Sing, PA-C       Or  . ondansetron [facility]) injection 4 mg  4 mg Intravenous Q6H PRN Corky Sing, PA-C      . oxyCODONE (Oxy IR/ROXICODONE) immediate release tablet 5-10 mg  5-10 mg Oral Q3H PRN Corky Sing, PA-C   5 mg at 01/08/17 0820  . polyethylene glycol (MIRALAX / GLYCOLAX) packet 17 g  17 g Oral Daily PRN Rod Can, MD         Discharge Medications: Please see discharge summary for a list of discharge medications.  Relevant Imaging Results:  Relevant Lab Results:   Additional Information SSN [id number]   Barbette Or, [place]

## 2017-01-08 NOTE — Discharge Instructions (Signed)
Wylene Simmer, MD [place]  Please read the following information regarding your care after surgery.  Narcotic pain medicine (ex. oxycodone, Percocet, Vicodin) will cause constipation.  To prevent this problem, take the following medicines while you are taking any pain medicine. X docusate sodium (Colace) 100 mg twice a day X senna (Senokot) 2 tablets twice a day  X To help prevent blood clots, take a baby aspirin (81 mg) twice a day for two weeks after surgery.  You should also get up every hour while you are awake to move around.    Weight Bearing ? Bear weight when you are able on your operated leg or foot. ? Bear weight only on your operated foot in the post-op shoe. X Do not bear any weight on the operated leg or foot.  Cast / Splint / Dressing X Keep your splint, cast or dressing clean and dry.  Dont put anything (coat hanger, pencil, etc) down inside of it.  If it gets damp, use a hair dryer on the cool setting to dry it.  If it gets soaked, call the office to schedule an appointment for a cast change. ? Remove your dressing 3 days after surgery and cover the incisions with dry dressings.    After your dressing, cast or splint is removed; you may shower, but do not soak or scrub the wound.  Allow the water to run over it, and then gently pat it dry.  Swelling It is normal for you to have swelling where you had surgery.  To reduce swelling and pain, keep your toes above your nose for at least 3 days after surgery.  It may be necessary to keep your foot or leg elevated for several weeks.  If it hurts, it should be elevated.  Follow Up Call my office at [phone] when you are discharged from the hospital or surgery center to schedule an appointment to be seen two weeks after surgery.  Call my office at [phone] if you develop a fever >101.5 F, nausea, vomiting, bleeding from the surgical site or severe pain.

## 2017-01-08 NOTE — Care Management Note (Signed)
Case Management Note  Patient Details  Name: Diana Oneill MRN: [id number] Date of Birth: [birth year]  Subjective/Objective:                    Action/Plan:   Expected Discharge Date:  01/08/17               Expected Discharge Plan:  Skilled Nursing Facility  In-House Referral:  Clinical Social Work  Discharge planning Services     Post Acute Care Choice:    Choice offered to:     DME Arranged:    DME Agency:     HH Arranged:    [place] Agency:     Status of Service:  In process, will continue to follow  If discussed at Long Length of Stay Meetings, dates discussed:    Additional Comments:  Marilu Favre, RN 01/08/2017, 10:58 AM

## 2017-01-08 NOTE — Clinical Social Work Note (Signed)
Clinical Social Worker facilitated patient discharge including contacting patient family and facility to confirm patient discharge plans.  Clinical information faxed to facility and family agreeable with plan.  CSW arranged ambulance transport via [facility] to [facility] .  RN to call [phone] for report prior to discharge.  Clinical Social Worker will sign off for now as social work intervention is no longer needed. Please consult [place] again if new need arises.  [place], [place]

## 2017-01-08 NOTE — Progress Notes (Signed)
Patient transferred to SNF, report called to [facility]  Belongings bagged and sent with patient.  Patient had a vase of flowers that she wanted to keep but EMS couldn't transport, left at front desk for husband to pick up on 01/09/17.  VS stable, BP soft but consistent with previous VS.  AVS printed and sent with EMS.

## 2017-01-08 NOTE — Clinical Social Work Note (Signed)
Clinical Social Work Assessment  Patient Details  Name: Diana Oneill MRN: [id number] Date of Birth: 04-27-1956  Date of referral:  01/08/17               Reason for consult:  Facility Placement                Permission sought to share information with:  Family Supports Permission granted to share information::  Yes, Verbal Permission Granted  Name::     Environmental health practitioner::     Relationship::  Spouse  Contact Information:     Housing/Transportation Living arrangements for the past 2 months:  [place] of Information:  Patient Patient Interpreter Needed:  None Criminal Activity/Legal Involvement Pertinent to Current Situation/Hospitalization:  No - Comment as needed Significant Relationships:  Spouse Lives with:  Spouse Do you feel safe going back to the place where you live?    Need for family participation in patient care:     Care giving concerns:  NO family or friends present at bedside during initial assessment.    Social Worker assessment / plan:  CSW met with pt at bedside to confirm plan. Pt agreeable to SNF placement. Pt wants to go to [facility]. Facility will take pt today. CSW will set up [facility] transport.   Employment status:    Insurance information:  Medicare PT Recommendations:  [place] / Referral to community resources:  [place]  Patient/Family's Response to care:  Pt verbalized understanding of CSW role and expressed appreciation for support. Pt denies any concern regarding pt care at this time.   Patient/Family's Understanding of and Emotional Response to Diagnosis, Current Treatment, and Prognosis:  Pt understanding and realistic regarding physical limitations. Pt understands the need for SNF placement at d/c. Pt agreeable to SNF placement at d/c, at this time. Pt's responses emotionally appropriate during conversation with CSW. Pt denies any concern regarding treatment plan  at this time. CSW will continue to provide support and facilitate d/c needs.   Emotional Assessment Appearance:  Appears stated age Attitude/Demeanor/Rapport:   (Patient was appropriate.) Affect (typically observed):  Accepting, Appropriate, Calm Orientation:  Oriented to Situation, Oriented to  Time, Oriented to Place, Oriented to Self Alcohol / Substance use:  Not Applicable Psych involvement (Current and /or in the community):  No (Comment)  Discharge Needs  Concerns to be addressed:  Care Coordination, Basic Needs Readmission within the last 30 days:  No Current discharge risk:  Dependent with Mobility Barriers to Discharge:  No Barriers Identified   Diana Oneill Diana Kanav Kazmierczak, LCSW 01/08/2017, 2:41 PM

## 2017-01-08 NOTE — Discharge Summary (Signed)
Physician Discharge Summary  Patient ID: Diana Oneill MRN: [id number] DOB/AGE: December 11, 1955 61 y.o.  Admit date: 01/05/2017 Discharge date: 01/08/2017  Admission Diagnoses:  S/p R total knee replacement htn COPD  Discharge Diagnoses:  Active Problems:   Closed displaced trimalleolar fracture of right ankle same as above S/p ORIF R ankle trimal fracture  Discharged Condition: stable  Hospital Course: Pt was admitted on 9/7 and taken to the OR for ORIF of her right ankle trimal fracture.  She tolerated the procedure and had PT and OT consults which both recommended continued snf care.  She is discharged to day in stable condition back to snf.  Consults: PT, OT  Significant Diagnostic Studies: none  Treatments: surgery: as above  Discharge Exam: Blood pressure 109/68, pulse 70, temperature 98.2 F (36.8 C), temperature source Oral, resp. rate 18, height 5\' 6"  (1.676 m), weight 104 kg (229 lb 4.8 oz), SpO2 96 %. WN WD woman in nad.  A and O x 4.  Mood and affect normal.  EOMI.  resp unlabored.  R ankle splinted.  NVI at the toes.  Disposition: 01-Home or Self Care  Discharge Instructions    Call MD / Call 911    Complete by:  As directed    If you experience chest pain or shortness of breath, CALL 911 and be transported to the hospital emergency room.  If you develope a fever above 101 F, pus (white drainage) or increased drainage or redness at the wound, or calf pain, call your surgeon's office.   Constipation Prevention    Complete by:  As directed    Drink plenty of fluids.  Prune juice may be helpful.  You may use a stool softener, such as Colace (over the counter) 100 mg twice a day.  Use MiraLax (over the counter) for constipation as needed.   Diet - low sodium heart healthy    Complete by:  As directed    Increase activity slowly as tolerated    Complete by:  As directed    Non weight bearing    Complete by:  As directed    Laterality:  right   Extremity:   Lower     Allergies as of 01/08/2017      Reactions   Penicillins Anaphylaxis, Other (See Comments)   Has patient had a PCN reaction causing immediate rash, facial/tongue/throat swelling, SOB or lightheadedness with hypotension: Yes Has patient had a PCN reaction causing severe rash involving mucus membranes or skin necrosis: No Has patient had a PCN reaction that required hospitalization: No Has patient had a PCN reaction occurring within the last 10 years: No If all of the above answers are "NO", then may proceed with Cephalosporin use.   Prozac [fluoxetine Hcl] Hives, Swelling, Other (See Comments)   Facial swelling      Medication List    TAKE these medications   acetaminophen 325 MG tablet Commonly known as:  TYLENOL Take 650 mg by mouth every 8 (eight) hours as needed for moderate pain or fever.   amLODipine 5 MG tablet Commonly known as:  NORVASC Take 5 mg by mouth daily.   aspirin 81 MG chewable tablet Chew 1 tablet (81 mg total) by mouth 2 (two) times daily.   citalopram 40 MG tablet Commonly known as:  CELEXA Take 40 mg by mouth daily.   clonazePAM 0.5 MG tablet Commonly known as:  KLONOPIN Take 0.5 mg by mouth every 8 (eight) hours as needed for anxiety.  divalproex 500 MG DR tablet Commonly known as:  DEPAKOTE Take 500 mg by mouth 2 (two) times daily.   docusate sodium 100 MG capsule Commonly known as:  COLACE Take 1 capsule (100 mg total) by mouth 2 (two) times daily.   gabapentin 300 MG capsule Commonly known as:  NEURONTIN Take 300 mg by mouth 3 (three) times daily.   HYDROmorphone 2 MG tablet Commonly known as:  DILAUDID Take 1 tablet (2 mg total) by mouth every 4 (four) hours as needed for severe pain.   nystatin powder Generic drug:  nystatin Apply 1 g topically 3 (three) times daily.   ondansetron 4 MG tablet Commonly known as:  ZOFRAN Take 1 tablet (4 mg total) by mouth every 6 (six) hours as needed for nausea.   polyethylene glycol  packet Commonly known as:  MIRALAX / GLYCOLAX Take 17 g by mouth daily as needed for moderate constipation.   senna 8.6 MG Tabs tablet Commonly known as:  SENOKOT Take 2 tablets (17.2 mg total) by mouth at bedtime.   tamsulosin 0.4 MG Caps capsule Commonly known as:  FLOMAX Take 0.4 mg by mouth daily.            Discharge Care Instructions        Start     Ordered   01/08/17 0000  Call MD / Call 911    Comments:  If you experience chest pain or shortness of breath, CALL 911 and be transported to the hospital emergency room.  If you develope a fever above 101 F, pus (white drainage) or increased drainage or redness at the wound, or calf pain, call your surgeon's office.   01/08/17 0744   01/08/17 0000  Diet - low sodium heart healthy     01/08/17 0744   01/08/17 0000  Constipation Prevention    Comments:  Drink plenty of fluids.  Prune juice may be helpful.  You may use a stool softener, such as Colace (over the counter) 100 mg twice a day.  Use MiraLax (over the counter) for constipation as needed.   01/08/17 0744   01/08/17 0000  Increase activity slowly as tolerated     01/08/17 0744   01/08/17 0000  Non weight bearing    Question Answer Comment  Laterality right   Extremity Lower      01/08/17 0744     call [phone] to schedule a follow up appt with Dr. Doran Durand in 2-3 weeks.  SignedWylene Simmer 01/08/2017, 7:49 AM

## 2017-01-08 NOTE — Progress Notes (Signed)
Pt unable to urinate after removing the foley cath this afternoon, paged on call Dr. Roselee [place] and he ordered in and out cath, did bladder scan it shows 700 ml and did in and out cath with 500 ml output and relieved from abdominal pain.

## 2017-01-08 NOTE — Progress Notes (Signed)
MD Hewitt verbally told nurse not to place foley catheter despite new order to place catheter.  MD was not aware patient had voided without catheter.

## 2017-01-11 NOTE — OR Nursing (Signed)
Upon chart review it was noted a a 3.5 locking 22  screw was was not accounted for I called my OR scrub who confirmed only 12 screws were implanted in patient we explanted  2. I did not record second one which is this screw. Recorded was corrected and screw accounted for.

## 2017-01-12 ENCOUNTER — Encounter (HOSPITAL_COMMUNITY): Payer: Self-pay | Admitting: Orthopedic Surgery

## 2017-01-16 DIAGNOSIS — Z471 Aftercare following joint replacement surgery: Secondary | ICD-10-CM | POA: Diagnosis not present

## 2017-01-16 DIAGNOSIS — Z96652 Presence of left artificial knee joint: Secondary | ICD-10-CM | POA: Diagnosis not present

## 2017-01-20 DIAGNOSIS — I119 Hypertensive heart disease without heart failure: Secondary | ICD-10-CM | POA: Diagnosis not present

## 2017-01-20 DIAGNOSIS — J431 Panlobular emphysema: Secondary | ICD-10-CM | POA: Diagnosis not present

## 2017-01-20 DIAGNOSIS — F339 Major depressive disorder, recurrent, unspecified: Secondary | ICD-10-CM | POA: Diagnosis not present

## 2017-01-22 DIAGNOSIS — Z4789 Encounter for other orthopedic aftercare: Secondary | ICD-10-CM | POA: Diagnosis not present

## 2017-01-22 DIAGNOSIS — S82851D Displaced trimalleolar fracture of right lower leg, subsequent encounter for closed fracture with routine healing: Secondary | ICD-10-CM | POA: Diagnosis not present

## 2017-02-19 DIAGNOSIS — Z4789 Encounter for other orthopedic aftercare: Secondary | ICD-10-CM | POA: Diagnosis not present

## 2017-02-19 DIAGNOSIS — S82851D Displaced trimalleolar fracture of right lower leg, subsequent encounter for closed fracture with routine healing: Secondary | ICD-10-CM | POA: Diagnosis not present

## 2017-03-02 DIAGNOSIS — Z96651 Presence of right artificial knee joint: Secondary | ICD-10-CM | POA: Diagnosis not present

## 2017-03-02 DIAGNOSIS — F339 Major depressive disorder, recurrent, unspecified: Secondary | ICD-10-CM | POA: Diagnosis not present

## 2017-03-02 DIAGNOSIS — S82851D Displaced trimalleolar fracture of right lower leg, subsequent encounter for closed fracture with routine healing: Secondary | ICD-10-CM | POA: Diagnosis not present

## 2017-03-02 DIAGNOSIS — J449 Chronic obstructive pulmonary disease, unspecified: Secondary | ICD-10-CM | POA: Diagnosis not present

## 2017-03-02 DIAGNOSIS — F419 Anxiety disorder, unspecified: Secondary | ICD-10-CM | POA: Diagnosis not present

## 2017-03-02 DIAGNOSIS — I10 Essential (primary) hypertension: Secondary | ICD-10-CM | POA: Diagnosis not present

## 2017-03-02 DIAGNOSIS — Z8659 Personal history of other mental and behavioral disorders: Secondary | ICD-10-CM | POA: Diagnosis not present

## 2017-03-02 DIAGNOSIS — Z87891 Personal history of nicotine dependence: Secondary | ICD-10-CM | POA: Diagnosis not present

## 2017-03-02 DIAGNOSIS — F319 Bipolar disorder, unspecified: Secondary | ICD-10-CM | POA: Diagnosis not present

## 2017-03-07 DIAGNOSIS — R918 Other nonspecific abnormal finding of lung field: Secondary | ICD-10-CM | POA: Diagnosis not present

## 2017-03-07 DIAGNOSIS — R2243 Localized swelling, mass and lump, lower limb, bilateral: Secondary | ICD-10-CM | POA: Diagnosis not present

## 2017-03-07 DIAGNOSIS — J90 Pleural effusion, not elsewhere classified: Secondary | ICD-10-CM | POA: Diagnosis not present

## 2017-03-08 DIAGNOSIS — J449 Chronic obstructive pulmonary disease, unspecified: Secondary | ICD-10-CM | POA: Diagnosis not present

## 2017-03-08 DIAGNOSIS — J90 Pleural effusion, not elsewhere classified: Secondary | ICD-10-CM | POA: Diagnosis not present

## 2017-03-08 DIAGNOSIS — F319 Bipolar disorder, unspecified: Secondary | ICD-10-CM | POA: Diagnosis not present

## 2017-03-08 DIAGNOSIS — I10 Essential (primary) hypertension: Secondary | ICD-10-CM | POA: Diagnosis not present

## 2017-03-08 DIAGNOSIS — M1990 Unspecified osteoarthritis, unspecified site: Secondary | ICD-10-CM | POA: Diagnosis not present

## 2017-03-14 DIAGNOSIS — Z6838 Body mass index (BMI) 38.0-38.9, adult: Secondary | ICD-10-CM | POA: Diagnosis not present

## 2017-03-14 DIAGNOSIS — E669 Obesity, unspecified: Secondary | ICD-10-CM | POA: Diagnosis not present

## 2017-03-14 DIAGNOSIS — S82891A Other fracture of right lower leg, initial encounter for closed fracture: Secondary | ICD-10-CM | POA: Diagnosis not present

## 2017-03-14 DIAGNOSIS — Z79899 Other long term (current) drug therapy: Secondary | ICD-10-CM | POA: Diagnosis not present

## 2017-03-14 DIAGNOSIS — R600 Localized edema: Secondary | ICD-10-CM | POA: Diagnosis not present

## 2017-03-19 DIAGNOSIS — Z4789 Encounter for other orthopedic aftercare: Secondary | ICD-10-CM | POA: Diagnosis not present

## 2017-03-19 DIAGNOSIS — S82851D Displaced trimalleolar fracture of right lower leg, subsequent encounter for closed fracture with routine healing: Secondary | ICD-10-CM | POA: Diagnosis not present

## 2017-03-20 DIAGNOSIS — S82851D Displaced trimalleolar fracture of right lower leg, subsequent encounter for closed fracture with routine healing: Secondary | ICD-10-CM | POA: Diagnosis not present

## 2017-03-20 DIAGNOSIS — F339 Major depressive disorder, recurrent, unspecified: Secondary | ICD-10-CM | POA: Diagnosis not present

## 2017-03-20 DIAGNOSIS — F319 Bipolar disorder, unspecified: Secondary | ICD-10-CM | POA: Diagnosis not present

## 2017-03-20 DIAGNOSIS — F419 Anxiety disorder, unspecified: Secondary | ICD-10-CM | POA: Diagnosis not present

## 2017-03-20 DIAGNOSIS — J449 Chronic obstructive pulmonary disease, unspecified: Secondary | ICD-10-CM | POA: Diagnosis not present

## 2017-03-20 DIAGNOSIS — I10 Essential (primary) hypertension: Secondary | ICD-10-CM | POA: Diagnosis not present

## 2017-03-28 DIAGNOSIS — F419 Anxiety disorder, unspecified: Secondary | ICD-10-CM | POA: Diagnosis not present

## 2017-03-28 DIAGNOSIS — F339 Major depressive disorder, recurrent, unspecified: Secondary | ICD-10-CM | POA: Diagnosis not present

## 2017-03-28 DIAGNOSIS — S82851D Displaced trimalleolar fracture of right lower leg, subsequent encounter for closed fracture with routine healing: Secondary | ICD-10-CM | POA: Diagnosis not present

## 2017-03-28 DIAGNOSIS — I10 Essential (primary) hypertension: Secondary | ICD-10-CM | POA: Diagnosis not present

## 2017-03-28 DIAGNOSIS — F319 Bipolar disorder, unspecified: Secondary | ICD-10-CM | POA: Diagnosis not present

## 2017-03-28 DIAGNOSIS — J449 Chronic obstructive pulmonary disease, unspecified: Secondary | ICD-10-CM | POA: Diagnosis not present

## 2017-04-02 DIAGNOSIS — F419 Anxiety disorder, unspecified: Secondary | ICD-10-CM | POA: Diagnosis not present

## 2017-04-02 DIAGNOSIS — J449 Chronic obstructive pulmonary disease, unspecified: Secondary | ICD-10-CM | POA: Diagnosis not present

## 2017-04-02 DIAGNOSIS — F339 Major depressive disorder, recurrent, unspecified: Secondary | ICD-10-CM | POA: Diagnosis not present

## 2017-04-02 DIAGNOSIS — I10 Essential (primary) hypertension: Secondary | ICD-10-CM | POA: Diagnosis not present

## 2017-04-02 DIAGNOSIS — S82851D Displaced trimalleolar fracture of right lower leg, subsequent encounter for closed fracture with routine healing: Secondary | ICD-10-CM | POA: Diagnosis not present

## 2017-04-02 DIAGNOSIS — F319 Bipolar disorder, unspecified: Secondary | ICD-10-CM | POA: Diagnosis not present

## 2017-04-12 DIAGNOSIS — M62552 Muscle wasting and atrophy, not elsewhere classified, left thigh: Secondary | ICD-10-CM | POA: Diagnosis not present

## 2017-04-12 DIAGNOSIS — M25571 Pain in right ankle and joints of right foot: Secondary | ICD-10-CM | POA: Diagnosis not present

## 2017-04-12 DIAGNOSIS — R2689 Other abnormalities of gait and mobility: Secondary | ICD-10-CM | POA: Diagnosis not present

## 2017-04-12 DIAGNOSIS — M62551 Muscle wasting and atrophy, not elsewhere classified, right thigh: Secondary | ICD-10-CM | POA: Diagnosis not present

## 2017-04-16 DIAGNOSIS — M62552 Muscle wasting and atrophy, not elsewhere classified, left thigh: Secondary | ICD-10-CM | POA: Diagnosis not present

## 2017-04-16 DIAGNOSIS — M62551 Muscle wasting and atrophy, not elsewhere classified, right thigh: Secondary | ICD-10-CM | POA: Diagnosis not present

## 2017-04-16 DIAGNOSIS — R2689 Other abnormalities of gait and mobility: Secondary | ICD-10-CM | POA: Diagnosis not present

## 2017-04-16 DIAGNOSIS — M25571 Pain in right ankle and joints of right foot: Secondary | ICD-10-CM | POA: Diagnosis not present

## 2017-04-17 DIAGNOSIS — R2689 Other abnormalities of gait and mobility: Secondary | ICD-10-CM | POA: Diagnosis not present

## 2017-04-17 DIAGNOSIS — M25571 Pain in right ankle and joints of right foot: Secondary | ICD-10-CM | POA: Diagnosis not present

## 2017-04-17 DIAGNOSIS — M62551 Muscle wasting and atrophy, not elsewhere classified, right thigh: Secondary | ICD-10-CM | POA: Diagnosis not present

## 2017-04-17 DIAGNOSIS — M62552 Muscle wasting and atrophy, not elsewhere classified, left thigh: Secondary | ICD-10-CM | POA: Diagnosis not present

## 2017-04-18 DIAGNOSIS — Z09 Encounter for follow-up examination after completed treatment for conditions other than malignant neoplasm: Secondary | ICD-10-CM | POA: Diagnosis not present

## 2017-04-18 DIAGNOSIS — S82851D Displaced trimalleolar fracture of right lower leg, subsequent encounter for closed fracture with routine healing: Secondary | ICD-10-CM | POA: Diagnosis not present

## 2017-04-19 DIAGNOSIS — M62552 Muscle wasting and atrophy, not elsewhere classified, left thigh: Secondary | ICD-10-CM | POA: Diagnosis not present

## 2017-04-19 DIAGNOSIS — M62551 Muscle wasting and atrophy, not elsewhere classified, right thigh: Secondary | ICD-10-CM | POA: Diagnosis not present

## 2017-04-19 DIAGNOSIS — R2689 Other abnormalities of gait and mobility: Secondary | ICD-10-CM | POA: Diagnosis not present

## 2017-04-19 DIAGNOSIS — M25571 Pain in right ankle and joints of right foot: Secondary | ICD-10-CM | POA: Diagnosis not present

## 2017-04-25 DIAGNOSIS — M25571 Pain in right ankle and joints of right foot: Secondary | ICD-10-CM | POA: Diagnosis not present

## 2017-04-25 DIAGNOSIS — M62552 Muscle wasting and atrophy, not elsewhere classified, left thigh: Secondary | ICD-10-CM | POA: Diagnosis not present

## 2017-04-25 DIAGNOSIS — M62551 Muscle wasting and atrophy, not elsewhere classified, right thigh: Secondary | ICD-10-CM | POA: Diagnosis not present

## 2017-04-25 DIAGNOSIS — R2689 Other abnormalities of gait and mobility: Secondary | ICD-10-CM | POA: Diagnosis not present

## 2017-07-10 DIAGNOSIS — M25579 Pain in unspecified ankle and joints of unspecified foot: Secondary | ICD-10-CM | POA: Insufficient documentation

## 2017-07-10 HISTORY — DX: Pain in unspecified ankle and joints of unspecified foot: M25.579

## 2018-01-21 IMAGING — CT CT ANGIO CHEST
2 of 6 series · 18 of 36 positions shown · IV contrast (isovue)
Comparison: 10/25/2016

CLINICAL DATA: Hypoxemia

EXAM:
CT ANGIOGRAPHY CHEST WITH CONTRAST
TECHNIQUE: Multidetector CT imaging of the chest was performed using the
standard protocol during bolus administration of intravenous
contrast. Multiplanar CT image reconstructions and MIPs were
obtained to evaluate the vascular anatomy.
CONTRAST:  100 cc Isovue 370 IV

[Series 7: pe thins · axial · 0.80mm/px · z∈[+1436,+1622]mm · 17 of 210 slices shown]
[im 12/210  lung]
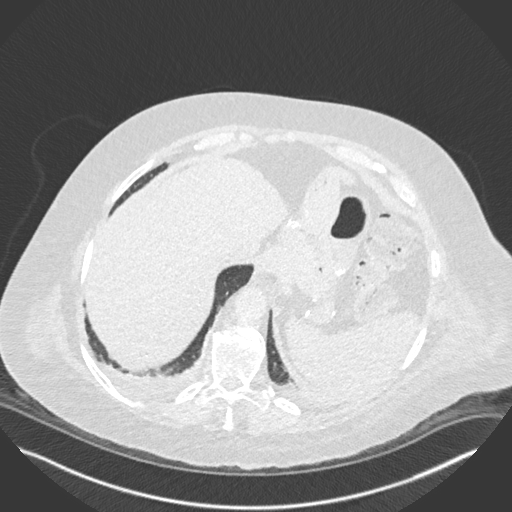
[im 24/210  mediastinal]
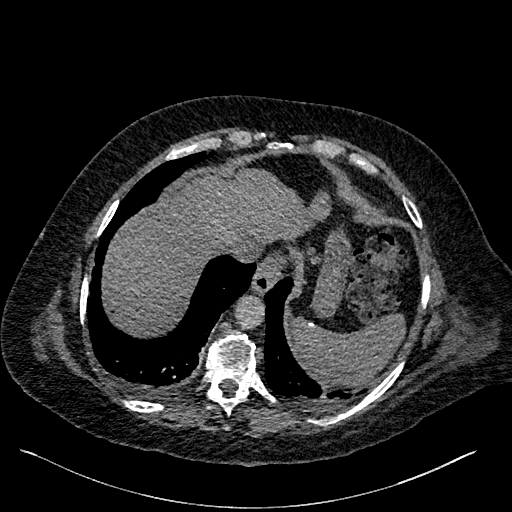
[im 35/210  lung]
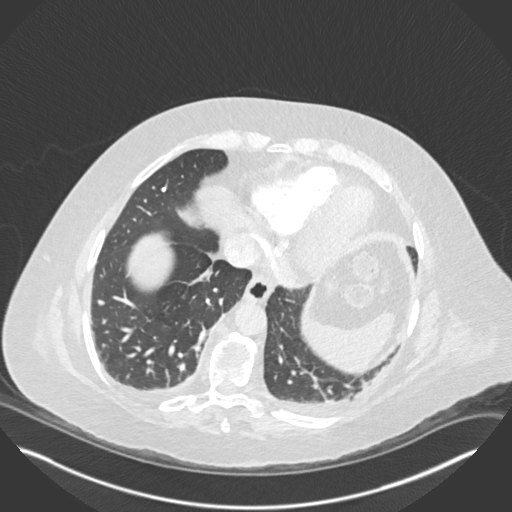
[im 47/210  mediastinal]
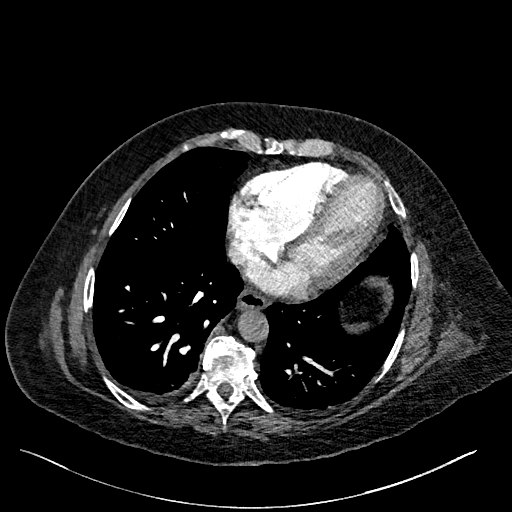
[im 59/210  lung]
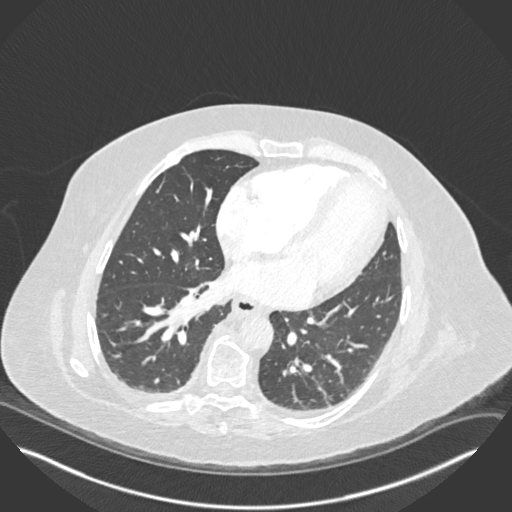
[im 70/210  mediastinal]
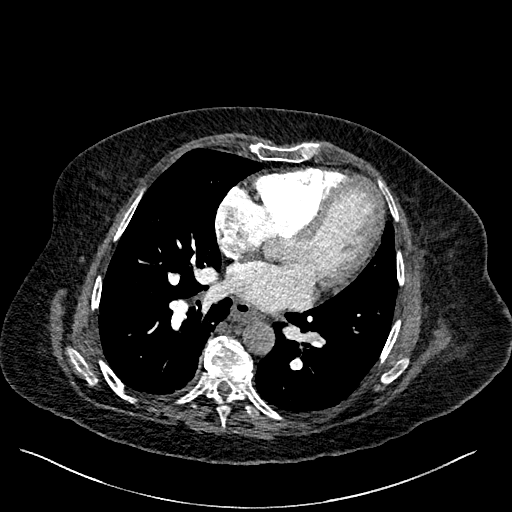
[im 82/210  lung]
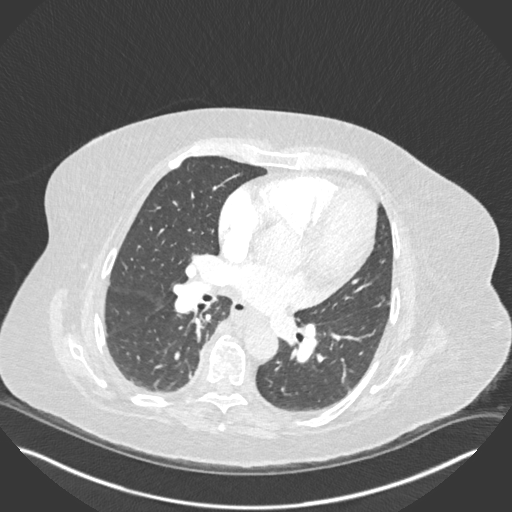
[im 93/210  mediastinal]
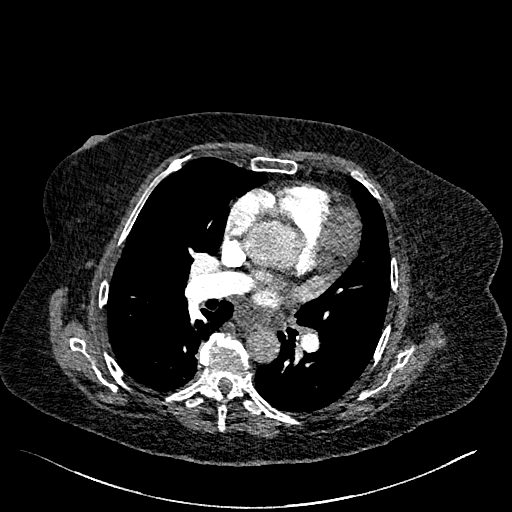
[im 105/210  lung]
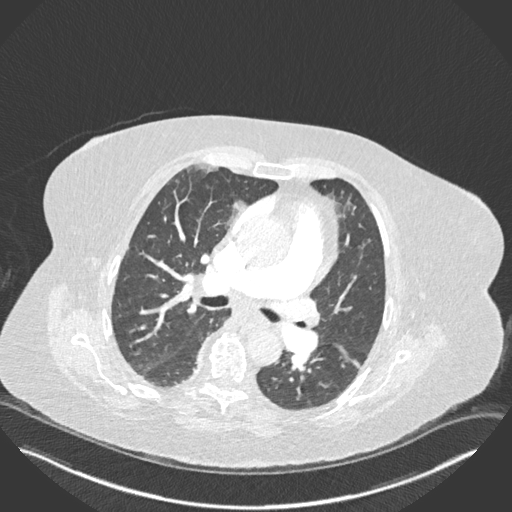
[im 117/210  mediastinal]
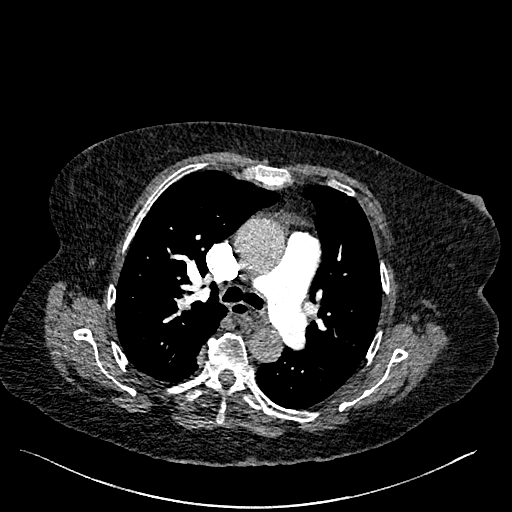
[im 128/210  lung]
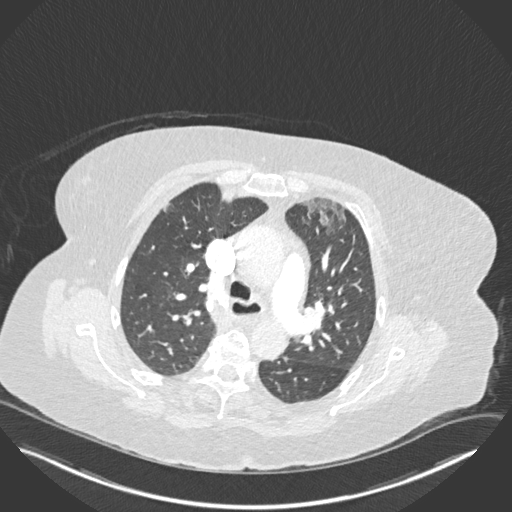
[im 140/210  mediastinal]
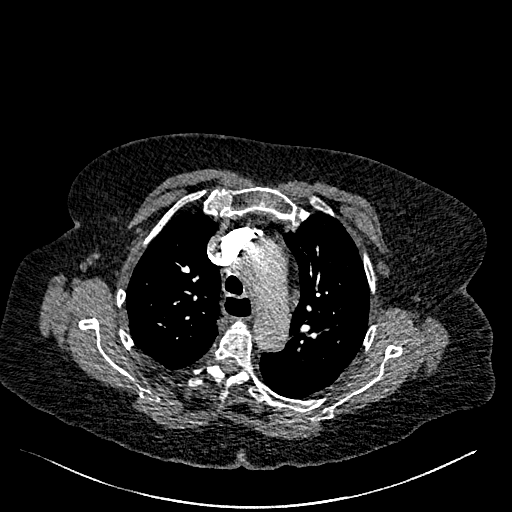
[im 151/210  lung]
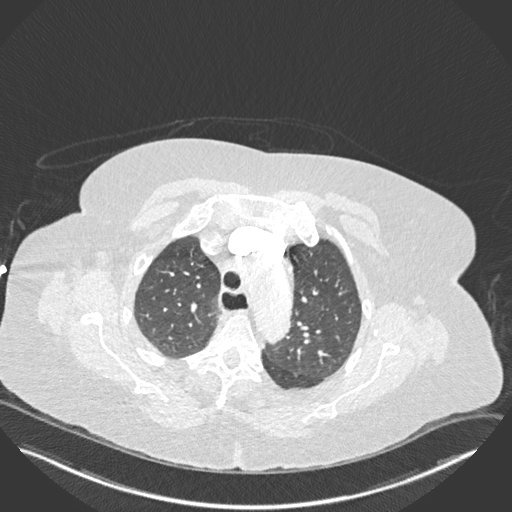
[im 163/210  mediastinal]
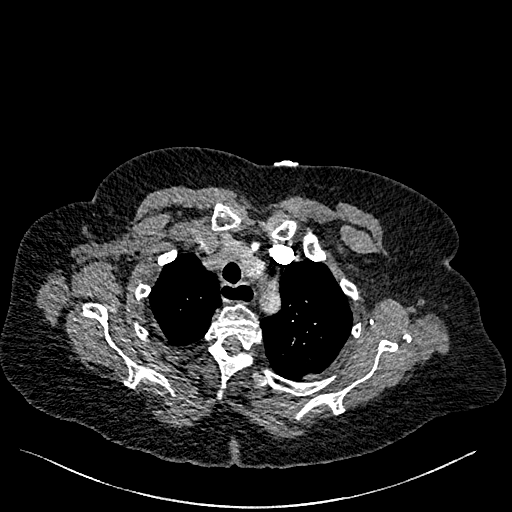
[im 175/210  lung]
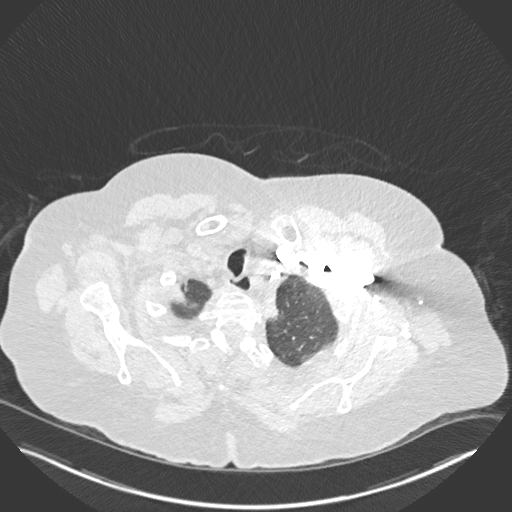
[im 186/210  mediastinal]
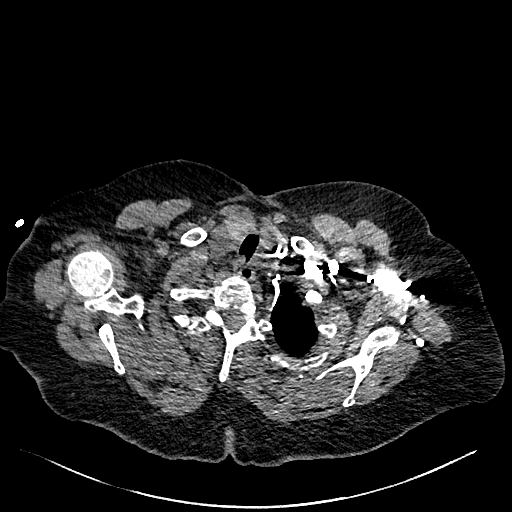
[im 198/210  lung]
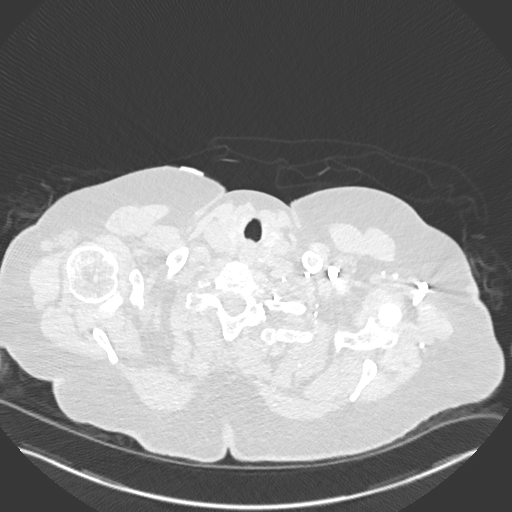

[Series 8: pe 2mm cor · coronal · 0.45mm/px · 1 of 151 slices shown]
[im 76/151  mediastinal]
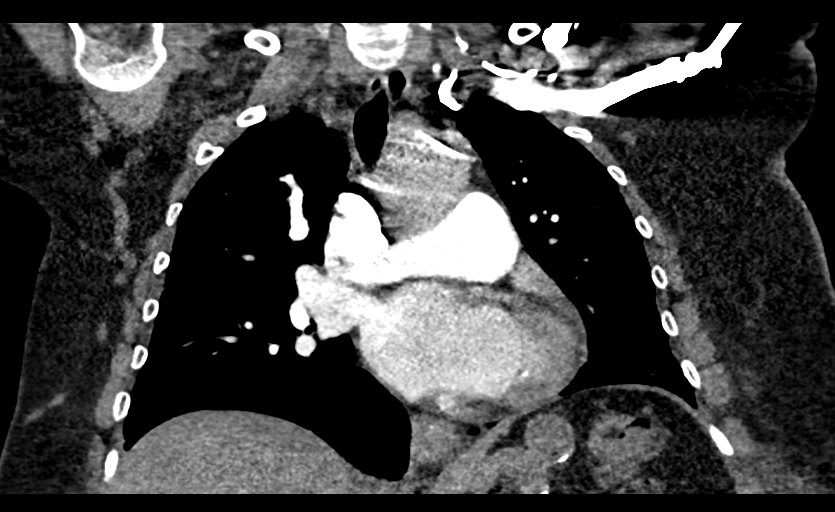

[18 of 36 positions shown; findings below may reference images not displayed]

FINDINGS: Cardiovascular: Heart is mildly enlarged. Slight dilatation of the
ascending thoracic aorta, 4.1 cm. No filling defects in the
pulmonary arteries to suggest pulmonary emboli.

Mediastinum/Nodes: No mediastinal, hilar, or axillary adenopathy.

Lungs/Pleura: Small bilateral pleural effusions. Patchy ground-glass
opacities noted anteriorly in the lingula and right upper lobe. No
confluent airspace opacities.

Upper Abdomen: Imaging into the upper abdomen shows no acute
findings.

Musculoskeletal: Chest wall soft tissues are unremarkable. No acute
bony abnormality.

Review of the MIP images confirms the above findings.
IMPRESSION: No evidence of pulmonary embolus.

Trace bilateral pleural effusions. Scattered ground-glass opacities
anteriorly in the right upper lobe and lingula could reflect areas
of alveolitis/ small airways disease.

4.1 cm ascending thoracic aortic aneurysm. Recommend annual imaging
followup by CTA or MRA. This recommendation follows 2191
ACCF/AHA/AATS/ACR/ASA/SCA/UNG/[place]/GALLAGHER MACKAY/MARICHU[place] Guidelines for the
Diagnosis and Management of Patients with Thoracic Aortic Disease.
Circulation. 2191; 121: [id number]

## 2018-05-01 HISTORY — PX: CATARACT EXTRACTION, BILATERAL: SHX1313

## 2018-06-19 ENCOUNTER — Ambulatory Visit: Payer: Medicare Other | Admitting: Sports Medicine

## 2018-06-19 ENCOUNTER — Other Ambulatory Visit: Payer: Self-pay

## 2018-10-09 ENCOUNTER — Encounter: Payer: Self-pay | Admitting: Sports Medicine

## 2018-10-09 ENCOUNTER — Other Ambulatory Visit: Payer: Self-pay

## 2018-10-09 ENCOUNTER — Ambulatory Visit (INDEPENDENT_AMBULATORY_CARE_PROVIDER_SITE_OTHER): Payer: Medicare Other | Admitting: Sports Medicine

## 2018-10-09 ENCOUNTER — Ambulatory Visit (INDEPENDENT_AMBULATORY_CARE_PROVIDER_SITE_OTHER): Payer: Medicare Other

## 2018-10-09 VITALS — Temp 98.6°F | Resp 16

## 2018-10-09 DIAGNOSIS — Z8781 Personal history of (healed) traumatic fracture: Secondary | ICD-10-CM | POA: Diagnosis not present

## 2018-10-09 DIAGNOSIS — M25571 Pain in right ankle and joints of right foot: Secondary | ICD-10-CM | POA: Diagnosis not present

## 2018-10-09 DIAGNOSIS — M7751 Other enthesopathy of right foot: Secondary | ICD-10-CM | POA: Diagnosis not present

## 2018-10-09 DIAGNOSIS — M779 Enthesopathy, unspecified: Secondary | ICD-10-CM | POA: Diagnosis not present

## 2018-10-09 DIAGNOSIS — M1250 Traumatic arthropathy, unspecified site: Secondary | ICD-10-CM | POA: Diagnosis not present

## 2018-10-09 MED ORDER — LIDOCAINE 5 % EX OINT: 1.0000 "application " | TOPICAL_OINTMENT | CUTANEOUS | 0 refills | Status: DC | PRN

## 2018-10-09 MED ORDER — PREDNISONE 10 MG (21) PO TBPK: ORAL_TABLET | ORAL | 0 refills | Status: DC

## 2018-10-09 MED ORDER — TRIAMCINOLONE ACETONIDE 40 MG/ML IJ SUSP
20.0000 mg | Freq: Once | INTRAMUSCULAR | Status: AC
  Administered 2018-10-09: 20 mg

## 2018-10-09 NOTE — Progress Notes (Signed)
Subjective:  Diana Oneill is a 63 y.o. female patient who presents to office for evaluation of right ankle pain. Patient complains of continued pain in the ankle which she fractured it in August 2018 reports that she has sharp burning pain to the ankle 7 out of 10 and difficulty with walking gait currently uses a ankle brace without relief.reports that her primary doctor has been giving her hydrocodone to help the pain has referred her to a pain management doctor.  Patient also reports that she has followed up with her orthopedic surgeon who gave her a brace and stated that there was nothing more that they could do for her.  Patient reports by the end of her day her pain is very excruciating and that the 2 pills of the hydrocodone her doctor has written for her to take daily does not help offer any long-term relief. Patient denies any other pedal complaints. Review of Systems  Cardiovascular: Positive for leg swelling.  Musculoskeletal: Positive for joint pain and myalgias.       Gait problem    Patient Active Problem List   Diagnosis Date Noted  . Closed displaced trimalleolar fracture of right ankle 01/05/2017  . Acute encephalopathy 10/25/2016  . Essential hypertension 10/25/2016  . Obesity 10/25/2016  . Acidosis 10/25/2016  . Nausea without vomiting 10/25/2016  . Tachycardia 10/25/2016  . COPD (chronic obstructive pulmonary disease) ([place]) 10/25/2016  . Acute respiratory failure ([place]) 10/25/2016  . Hyponatremia   . Acute kidney injury ([place])   . Anemia   . Thrombocytopenia ([place])   . Osteoarthritis of left knee 10/23/2016  . Bipolar disorder, now depressed ([place]) 08/10/2014  . Cocaine use disorder, mild, in early remission ([place]) 08/10/2014    Current Outpatient Medications on File Prior to Visit  Medication Sig Dispense Refill  . alendronate (FOSAMAX) 70 MG tablet take 1 tablet by mouth once weekly on empty stomach with 8 ounces of water. must sit upright for 30 minutes     . celecoxib (CELEBREX) 200 MG capsule TAKE ONE CAPSULE BY MOUTH DAILY WITH FOOD    . gabapentin (NEURONTIN) 600 MG tablet Take 600 mg by mouth 4 (four) times daily.    Marland Kitchen HYDROcodone-acetaminophen (NORCO/VICODIN) 5-325 MG tablet TAKE ONE TABLET BY MOUTH ONCE DAILY AS NEEDED    . hydrOXYzine (ATARAX/VISTARIL) 10 MG tablet Take 10 mg by mouth 3 (three) times daily.    Marland Kitchen lisinopril (PRINIVIL,ZESTRIL) 10 MG tablet Take 10 mg by mouth daily.    Marland Kitchen tiZANidine (ZANAFLEX) 4 MG tablet Take 4 mg by mouth 2 (two) times daily.    Marland Kitchen acetaminophen (TYLENOL) 325 MG tablet Take 650 mg by mouth every 8 (eight) hours as needed for moderate pain or fever.    Marland Kitchen amLODipine (NORVASC) 5 MG tablet Take 5 mg by mouth daily.    Marland Kitchen aspirin 81 MG chewable tablet Chew 1 tablet (81 mg total) by mouth 2 (two) times daily. (Patient not taking: Reported on 01/06/2017) 60 tablet 1  . azithromycin (ZITHROMAX) 250 MG tablet TAKE 2 TABLETS BY MOUTH ON DAY 1, THEN TAKE 1 TABLET DAILY ON DAYS 2-5    . CHANTIX STARTING MONTH PAK 0.5 MG X 11 & 1 MG X 42 tablet     . citalopram (CELEXA) 40 MG tablet Take 40 mg by mouth daily.    . clonazePAM (KLONOPIN) 0.5 MG tablet Take 0.5 mg by mouth every 8 (eight) hours as needed for anxiety.     Golden Hurter RESPIMAT  20-100 MCG/ACT AERS respimat USE ONE INHALATION FOUR TIMES DAILY AS NEEDED    . diclofenac sodium (VOLTAREN) 1 % GEL Apply topically 2 (two) times daily.    . divalproex (DEPAKOTE) 500 MG DR tablet Take 500 mg by mouth 2 (two) times daily.    Marland Kitchen docusate sodium (COLACE) 100 MG capsule Take 1 capsule (100 mg total) by mouth 2 (two) times daily. (Patient not taking: Reported on 01/06/2017) 60 capsule 1  . gabapentin (NEURONTIN) 300 MG capsule Take 300 mg by mouth 3 (three) times daily.     Michaela Corner AC 100-10 MG/5ML syrup TAKE ONE TEASPOONFUL BY MOUTH TWICE DAILY    . HYDROmorphone (DILAUDID) 2 MG tablet Take 1 tablet (2 mg total) by mouth every 4 (four) hours as needed for severe pain.  (Patient not taking: Reported on 10/09/2018) 60 tablet 0  . nystatin (NYSTATIN) powder Apply 1 g topically 3 (three) times daily.    . ondansetron (ZOFRAN) 4 MG tablet Take 1 tablet (4 mg total) by mouth every 6 (six) hours as needed for nausea. (Patient not taking: Reported on 01/06/2017) 20 tablet 0  . polyethylene glycol (MIRALAX / GLYCOLAX) packet Take 17 g by mouth daily as needed for moderate constipation.    . senna (SENOKOT) 8.6 MG TABS tablet Take 2 tablets (17.2 mg total) by mouth at bedtime. (Patient not taking: Reported on 01/06/2017) 120 each 0  . sertraline (ZOLOFT) 50 MG tablet Take 50 mg by mouth daily.    . tamsulosin (FLOMAX) 0.4 MG CAPS capsule Take 0.4 mg by mouth daily.    . traMADol (ULTRAM) 50 MG tablet Take 50 mg by mouth 2 (two) times daily.     No current facility-administered medications on file prior to visit.     Allergies  Allergen Reactions  . Penicillins Anaphylaxis and Other (See Comments)    Has patient had a PCN reaction causing immediate rash, facial/tongue/throat swelling, SOB or lightheadedness with hypotension: Yes Has patient had a PCN reaction causing severe rash involving mucus membranes or skin necrosis: No Has patient had a PCN reaction that required hospitalization: No Has patient had a PCN reaction occurring within the last 10 years: No If all of the above answers are "NO", then may proceed with Cephalosporin use.  . Prozac [Fluoxetine Hcl] Hives, Swelling and Other (See Comments)    Facial swelling    Objective:  General: Alert and oriented x3 in no acute distress  Dermatology: Surgical scars well-healed.  No open lesions bilateral lower extremities, no webspace macerations, no ecchymosis bilateral, all nails x 10 are well manicured.  Vascular: Dorsalis Pedis and Posterior Tibial pedal pulses palpable, Capillary Fill Time 3 seconds,(-) pedal hair growth bilateral, no edema bilateral lower extremities, venous hyperpigmentation and varicosities  especially on the right, temperature gradient within normal limits.  Neurology: Gross sensation intact via light touch bilateral, subjective burning sensation along the posterior tibial nerve distribution of the right foot and ankle.  Musculoskeletal: Mild tenderness with palpation at right ankle diffusely with limited range of motion with history of ankle fracture. Gait: Antalgic gait  Xrays  Right ankle   Impression: End-stage arthritis with ankle deformity hardware intact at ankle mild decreased osseous mineralization, no other acute findings.  Assessment and Plan: Problem List Items Addressed This Visit    None    Visit Diagnoses    Acute right ankle pain    -  Primary   Relevant Orders   DG Ankle Complete Right  Traumatic arthritis       Relevant Medications   triamcinolone acetonide (KENALOG-40) injection 20 mg (Completed) (Start on 10/09/2018  1:15 PM)   Capsulitis       Relevant Medications   triamcinolone acetonide (KENALOG-40) injection 20 mg (Completed) (Start on 10/09/2018  1:15 PM)   History of ankle fracture           -Complete examination performed -Xrays reviewed -Discussed treatement options for traumatic arthritis with capsulitis -After oral consent and aseptic prep, injected a mixture containing 1 ml of 2%  plain lidocaine, 1 ml 0.5% plain marcaine, 0.5 ml of kenalog 40 and 0.5 ml of dexamethasone phosphate into right ankle without complication. Post-injection care discussed with patient.  -Rx Medrol Dosepak and topical lidocaine to use as instructed -Continue with pain management follow-up as scheduled on June 30 -Continue with good supportive shoes and ankle brace -Patient to return to office 4 to 6 weeks or sooner if condition worsens.  Advised patient if she continues to have pain there is not good options that are conservative may require further orthopedic surgery including ankle fusion versus replacement.  Landis Martins, DPM

## 2018-10-09 NOTE — Progress Notes (Signed)
   Subjective:    Patient ID: Diana Oneill, female    DOB: 04-15-1956, 63 y.o.   MRN: [id number]  HPI    Review of Systems  Musculoskeletal: Positive for arthralgias, gait problem, joint swelling and myalgias.  All other systems reviewed and are negative.      Objective:   Physical Exam        Assessment & Plan:

## 2018-10-15 ENCOUNTER — Other Ambulatory Visit: Payer: Self-pay | Admitting: Sports Medicine

## 2018-10-15 DIAGNOSIS — M1250 Traumatic arthropathy, unspecified site: Secondary | ICD-10-CM

## 2018-10-15 DIAGNOSIS — M25571 Pain in right ankle and joints of right foot: Secondary | ICD-10-CM

## 2018-10-15 DIAGNOSIS — M779 Enthesopathy, unspecified: Secondary | ICD-10-CM

## 2018-10-25 ENCOUNTER — Encounter: Payer: Self-pay | Admitting: Sports Medicine

## 2018-10-25 ENCOUNTER — Other Ambulatory Visit: Payer: Self-pay

## 2018-10-25 ENCOUNTER — Ambulatory Visit (INDEPENDENT_AMBULATORY_CARE_PROVIDER_SITE_OTHER): Payer: Medicare Other | Admitting: Sports Medicine

## 2018-10-25 VITALS — Temp 99.7°F | Resp 16

## 2018-10-25 DIAGNOSIS — M1250 Traumatic arthropathy, unspecified site: Secondary | ICD-10-CM

## 2018-10-25 DIAGNOSIS — Z8781 Personal history of (healed) traumatic fracture: Secondary | ICD-10-CM

## 2018-10-25 DIAGNOSIS — Z87898 Personal history of other specified conditions: Secondary | ICD-10-CM

## 2018-10-25 DIAGNOSIS — M79675 Pain in left toe(s): Secondary | ICD-10-CM

## 2018-10-25 DIAGNOSIS — L600 Ingrowing nail: Secondary | ICD-10-CM

## 2018-10-25 DIAGNOSIS — M79674 Pain in right toe(s): Secondary | ICD-10-CM

## 2018-10-25 MED ORDER — NEOMYCIN-POLYMYXIN-HC 3.5-10000-1 OT SOLN: OTIC | 0 refills | Status: DC

## 2018-10-25 MED ORDER — TRAMADOL HCL 50 MG PO TABS: 50.0000 mg | ORAL_TABLET | Freq: Three times a day (TID) | ORAL | 0 refills | Status: AC | PRN

## 2018-10-25 NOTE — Patient Instructions (Signed)

## 2018-10-25 NOTE — Progress Notes (Signed)
Subjective: Diana Oneill is a 63 y.o. female patient presents to office today complaining of a moderately painful incurvated, red, hot, swollen medial greater than lateral nail border of the first toe on the left and right. This has been present for 2 to 3 days with redness and drainage especially on the right great toe medial border.  Patient has treated this by trimming. Patient denies fever/chills/nausea/vomitting/any other related constitutional symptoms at this time.  Patient reports that the injection that I gave her last visit helped her right ankle for a week or 2 now the pain is back and reports that she will be going to see pain management on Tuesday and reports that she cannot use her ankle brace due to some swelling reports that the steroid pack helped her ankle some as well but she has not been able to get the topical lidocaine prescription because she is waiting on prior authorization to be completed.  No other acute issues or problems noted at this time.  Patient Active Problem List   Diagnosis Date Noted  . Closed displaced trimalleolar fracture of right ankle 01/05/2017  . Acute encephalopathy 10/25/2016  . Essential hypertension 10/25/2016  . Obesity 10/25/2016  . Acidosis 10/25/2016  . Nausea without vomiting 10/25/2016  . Tachycardia 10/25/2016  . COPD (chronic obstructive pulmonary disease) ([place]) 10/25/2016  . Acute respiratory failure ([place]) 10/25/2016  . Hyponatremia   . Acute kidney injury ([place])   . Anemia   . Thrombocytopenia ([place])   . Osteoarthritis of left knee 10/23/2016  . Bipolar disorder, now depressed ([place]) 08/10/2014  . Cocaine use disorder, mild, in early remission ([place]) 08/10/2014    Current Outpatient Medications on File Prior to Visit  Medication Sig Dispense Refill  . acetaminophen (TYLENOL) 325 MG tablet Take 650 mg by mouth every 8 (eight) hours as needed for moderate pain or fever.    Marland Kitchen albuterol (VENTOLIN HFA) 108 (90 Base) MCG/ACT  inhaler INHALE TWO PUFFS EVERY 6 HOURS AS NEEDED    . alendronate (FOSAMAX) 70 MG tablet take 1 tablet by mouth once weekly on empty stomach with 8 ounces of water. must sit upright for 30 minutes    . amLODipine (NORVASC) 5 MG tablet Take 5 mg by mouth daily.    Marland Kitchen aspirin 81 MG chewable tablet Chew 1 tablet (81 mg total) by mouth 2 (two) times daily. 60 tablet 1  . azithromycin (ZITHROMAX) 250 MG tablet TAKE 2 TABLETS BY MOUTH ON DAY 1, THEN TAKE 1 TABLET DAILY ON DAYS 2-5    . celecoxib (CELEBREX) 200 MG capsule TAKE ONE CAPSULE BY MOUTH DAILY WITH FOOD    . CHANTIX STARTING MONTH PAK 0.5 MG X 11 & 1 MG X 42 tablet     . chlorhexidine (PERIDEX) 0.12 % solution     . citalopram (CELEXA) 40 MG tablet Take 40 mg by mouth daily.    . clindamycin (CLEOCIN) 300 MG capsule TK ONE C PO  Q 6 H    . clonazePAM (KLONOPIN) 0.5 MG tablet Take 0.5 mg by mouth every 8 (eight) hours as needed for anxiety.     . COMBIVENT RESPIMAT 20-100 MCG/ACT AERS respimat USE ONE INHALATION FOUR TIMES DAILY AS NEEDED    . diclofenac sodium (VOLTAREN) 1 % GEL Apply topically 2 (two) times daily.    . divalproex (DEPAKOTE) 500 MG DR tablet Take 500 mg by mouth 2 (two) times daily.    Marland Kitchen docusate sodium (COLACE) 100 MG capsule Take 1  capsule (100 mg total) by mouth 2 (two) times daily. 60 capsule 1  . fluticasone (FLONASE) 50 MCG/ACT nasal spray Place 2 sprays into both nostrils 2 (two) times daily.    Marland Kitchen gabapentin (NEURONTIN) 300 MG capsule Take 300 mg by mouth 3 (three) times daily.     Marland Kitchen gabapentin (NEURONTIN) 600 MG tablet Take 600 mg by mouth 4 (four) times daily.    Michaela Corner AC 100-10 MG/5ML syrup TAKE ONE TEASPOONFUL BY MOUTH TWICE DAILY    . HYDROcodone-acetaminophen (NORCO/VICODIN) 5-325 MG tablet TAKE ONE TABLET BY MOUTH ONCE DAILY AS NEEDED    . HYDROmorphone (DILAUDID) 2 MG tablet Take 1 tablet (2 mg total) by mouth every 4 (four) hours as needed for severe pain. 60 tablet 0  . hydrOXYzine (ATARAX/VISTARIL) 10  MG tablet Take 10 mg by mouth 3 (three) times daily.    . hydrOXYzine (ATARAX/VISTARIL) 25 MG tablet Take 25 mg by mouth 3 (three) times daily.    Marland Kitchen ketoconazole (NIZORAL) 2 % shampoo     . lidocaine (XYLOCAINE) 5 % ointment Apply 1 application topically as needed. 35.44 g 0  . lisinopril (PRINIVIL,ZESTRIL) 10 MG tablet Take 10 mg by mouth daily.    Marland Kitchen nystatin (NYSTATIN) powder Apply 1 g topically 3 (three) times daily.    . ondansetron (ZOFRAN) 4 MG tablet Take 1 tablet (4 mg total) by mouth every 6 (six) hours as needed for nausea. 20 tablet 0  . polyethylene glycol (MIRALAX / GLYCOLAX) packet Take 17 g by mouth daily as needed for moderate constipation.    . predniSONE (STERAPRED UNI-PAK 21 TAB) 10 MG (21) TBPK tablet Take as directed 21 tablet 0  . senna (SENOKOT) 8.6 MG TABS tablet Take 2 tablets (17.2 mg total) by mouth at bedtime. 120 each 0  . sertraline (ZOLOFT) 25 MG tablet Take 25 mg by mouth daily.    . sertraline (ZOLOFT) 50 MG tablet Take 50 mg by mouth daily.    . tamsulosin (FLOMAX) 0.4 MG CAPS capsule Take 0.4 mg by mouth daily.    Marland Kitchen tiZANidine (ZANAFLEX) 4 MG tablet Take 4 mg by mouth 2 (two) times daily.     No current facility-administered medications on file prior to visit.     Allergies  Allergen Reactions  . Penicillins Anaphylaxis and Other (See Comments)    Has patient had a PCN reaction causing immediate rash, facial/tongue/throat swelling, SOB or lightheadedness with hypotension: Yes Has patient had a PCN reaction causing severe rash involving mucus membranes or skin necrosis: No Has patient had a PCN reaction that required hospitalization: No Has patient had a PCN reaction occurring within the last 10 years: No If all of the above answers are "NO", then may proceed with Cephalosporin use.  . Prozac [Fluoxetine Hcl] Hives, Swelling and Other (See Comments)    Facial swelling    Objective:  There were no vitals filed for this visit.  General: Well developed,  nourished, in no acute distress, alert and oriented x3   Dermatology: Skin is warm, dry and supple bilateral.  Right and left hallux nail appears to be  severely incurvated with hyperkeratosis formation at the distal aspects of  the medial and lateral nail borders. (+) Erythema. (+) Edema. (+) serosanguous  drainage present. The remaining nails appear unremarkable at this time. There are no open sores, lesions or other signs of infection present.  Old surgical scars well-healed.  Vascular: Dorsalis Pedis artery and Posterior Tibial artery pedal pulses are  1/4 bilateral with immedate capillary fill time.  There is mild pedal hair growth noted with chronic edema and varicosities noted bilateral right greater than left.  Neruologic: Grossly intact via light touch bilateral.  Chronic burning sensations to feet with the right foot and ankle being most symptomatic due to history of trauma.  Musculoskeletal: Tenderness to palpation of the right and left hallux medial and lateral nail fold(s).  Regarding pain and limitation on the right ankle due to history of fracture.  Assesement and Plan: Problem List Items Addressed This Visit    None    Visit Diagnoses    Ingrown toenail    -  Primary   Toe pain, bilateral       History of chronic pain       Traumatic arthritis       History of ankle fracture          -Discussed treatment alternatives and plan of care; Explained permanent/temporary nail avulsion and post procedure course to patient. Patient elects for PNA right and left hallux medial and lateral nail borders - After a verbal and written consent, injected 3 ml of a 50:50 mixture of 2% plain  lidocaine and 0.5% plain marcaine in a normal hallux block fashion. Next, a  betadine prep was performed. Anesthesia was tested and found to be appropriate.  The offending right hallux medial and lateral nail borders were then incised from the hyponychium to the epinychium. The offending nail border was  removed and cleared from the field. The areas were curretted for any remaining nail or spicules. Phenol application performed and the area was then flushed with alcohol and dressed with antibiotic cream and a dry sterile dressing. -Patient was instructed to leave the dressing intact for today and begin soaking  in a weak solution of betadine or Epsom salt and water tomorrow. Patient was instructed to  soak for 15-20 minutes each day and apply neosporin/corticosporin and a gauze or bandaid dressing each day. -Patient was instructed to monitor the toes for signs of infection and return to office if toe becomes red, hot or swollen. -Advised ice, elevation, and prescribed this one-time only tramadol for pain and advised patient I cannot give her any additional pain medications she will have to consult with the pain management doctor that she will be seeing on next week for her ankle pain.  Recommend continue with good supportive shoes brace as tolerated for the right ankle until she can be seen by pain management -Patient is to return in 2 weeks for follow up care/nail check or sooner if problems arise.  Landis Martins, DPM

## 2018-10-26 DIAGNOSIS — J449 Chronic obstructive pulmonary disease, unspecified: Secondary | ICD-10-CM

## 2018-10-26 DIAGNOSIS — R001 Bradycardia, unspecified: Secondary | ICD-10-CM

## 2018-10-26 DIAGNOSIS — N179 Acute kidney failure, unspecified: Secondary | ICD-10-CM | POA: Diagnosis not present

## 2018-10-26 DIAGNOSIS — R600 Localized edema: Secondary | ICD-10-CM

## 2018-10-26 DIAGNOSIS — D649 Anemia, unspecified: Secondary | ICD-10-CM

## 2018-10-26 DIAGNOSIS — E875 Hyperkalemia: Secondary | ICD-10-CM | POA: Diagnosis not present

## 2018-10-26 DIAGNOSIS — I10 Essential (primary) hypertension: Secondary | ICD-10-CM

## 2018-10-27 DIAGNOSIS — R001 Bradycardia, unspecified: Secondary | ICD-10-CM | POA: Diagnosis not present

## 2018-10-27 DIAGNOSIS — E875 Hyperkalemia: Secondary | ICD-10-CM | POA: Diagnosis not present

## 2018-10-27 DIAGNOSIS — N179 Acute kidney failure, unspecified: Secondary | ICD-10-CM | POA: Diagnosis not present

## 2018-10-27 DIAGNOSIS — R600 Localized edema: Secondary | ICD-10-CM | POA: Diagnosis not present

## 2018-11-20 ENCOUNTER — Other Ambulatory Visit: Payer: Self-pay

## 2018-11-20 ENCOUNTER — Encounter: Payer: Self-pay | Admitting: Sports Medicine

## 2018-11-20 ENCOUNTER — Ambulatory Visit (INDEPENDENT_AMBULATORY_CARE_PROVIDER_SITE_OTHER): Payer: Self-pay | Admitting: Sports Medicine

## 2018-11-20 DIAGNOSIS — M79674 Pain in right toe(s): Secondary | ICD-10-CM

## 2018-11-20 DIAGNOSIS — Z8781 Personal history of (healed) traumatic fracture: Secondary | ICD-10-CM

## 2018-11-20 DIAGNOSIS — Z87898 Personal history of other specified conditions: Secondary | ICD-10-CM

## 2018-11-20 DIAGNOSIS — M1250 Traumatic arthropathy, unspecified site: Secondary | ICD-10-CM

## 2018-11-20 DIAGNOSIS — M79675 Pain in left toe(s): Secondary | ICD-10-CM

## 2018-11-20 DIAGNOSIS — L600 Ingrowing nail: Secondary | ICD-10-CM

## 2018-11-20 NOTE — Progress Notes (Signed)
Subjective: Diana Oneill is a 63 y.o. female patient returns to office today for follow up evaluation after having Right/Left Hallux medial/lateral permanent nail avulsions performed on (10/25/2018). Patient has been soaking using epsom salt and applying topical antibiotic covered with bandaid daily. Patient deniesfever/chills/nausea/vomitting/any other related constitutional symptoms at this time.  Patient Active Problem List   Diagnosis Date Noted  . Closed displaced trimalleolar fracture of right ankle 01/05/2017  . Acute encephalopathy 10/25/2016  . Essential hypertension 10/25/2016  . Obesity 10/25/2016  . Acidosis 10/25/2016  . Nausea without vomiting 10/25/2016  . Tachycardia 10/25/2016  . COPD (chronic obstructive pulmonary disease) ([place]) 10/25/2016  . Acute respiratory failure ([place]) 10/25/2016  . Hyponatremia   . Acute kidney injury ([place])   . Anemia   . Thrombocytopenia ([place])   . Osteoarthritis of left knee 10/23/2016  . Bipolar disorder, now depressed ([place]) 08/10/2014  . Cocaine use disorder, mild, in early remission ([place]) 08/10/2014    Current Outpatient Medications on File Prior to Visit  Medication Sig Dispense Refill  . acetaminophen (TYLENOL) 325 MG tablet Take 650 mg by mouth every 8 (eight) hours as needed for moderate pain or fever.    Marland Kitchen albuterol (VENTOLIN HFA) 108 (90 Base) MCG/ACT inhaler INHALE TWO PUFFS EVERY 6 HOURS AS NEEDED    . alendronate (FOSAMAX) 70 MG tablet take 1 tablet by mouth once weekly on empty stomach with 8 ounces of water. must sit upright for 30 minutes    . amLODipine (NORVASC) 5 MG tablet Take 5 mg by mouth daily.    Marland Kitchen aspirin 81 MG chewable tablet Chew 1 tablet (81 mg total) by mouth 2 (two) times daily. 60 tablet 1  . azithromycin (ZITHROMAX) 250 MG tablet TAKE 2 TABLETS BY MOUTH ON DAY 1, THEN TAKE 1 TABLET DAILY ON DAYS 2-5    . celecoxib (CELEBREX) 200 MG capsule TAKE ONE CAPSULE BY MOUTH DAILY WITH FOOD    . CHANTIX STARTING  MONTH PAK 0.5 MG X 11 & 1 MG X 42 tablet     . chlorhexidine (PERIDEX) 0.12 % solution     . citalopram (CELEXA) 40 MG tablet Take 40 mg by mouth daily.    . clindamycin (CLEOCIN) 300 MG capsule TK ONE C PO  Q 6 H    . clonazePAM (KLONOPIN) 0.5 MG tablet Take 0.5 mg by mouth every 8 (eight) hours as needed for anxiety.     . COMBIVENT RESPIMAT 20-100 MCG/ACT AERS respimat USE ONE INHALATION FOUR TIMES DAILY AS NEEDED    . diclofenac sodium (VOLTAREN) 1 % GEL Apply topically 2 (two) times daily.    . divalproex (DEPAKOTE) 500 MG DR tablet Take 500 mg by mouth 2 (two) times daily.    Marland Kitchen docusate sodium (COLACE) 100 MG capsule Take 1 capsule (100 mg total) by mouth 2 (two) times daily. 60 capsule 1  . fluticasone (FLONASE) 50 MCG/ACT nasal spray Place 2 sprays into both nostrils 2 (two) times daily.    Marland Kitchen gabapentin (NEURONTIN) 300 MG capsule Take 300 mg by mouth 3 (three) times daily.     Marland Kitchen gabapentin (NEURONTIN) 600 MG tablet Take 600 mg by mouth 4 (four) times daily.    Michaela Corner AC 100-10 MG/5ML syrup TAKE ONE TEASPOONFUL BY MOUTH TWICE DAILY    . HYDROcodone-acetaminophen (NORCO/VICODIN) 5-325 MG tablet TAKE ONE TABLET BY MOUTH ONCE DAILY AS NEEDED    . HYDROmorphone (DILAUDID) 2 MG tablet Take 1 tablet (2 mg total) by mouth  every 4 (four) hours as needed for severe pain. 60 tablet 0  . hydrOXYzine (ATARAX/VISTARIL) 10 MG tablet Take 10 mg by mouth 3 (three) times daily.    . hydrOXYzine (ATARAX/VISTARIL) 25 MG tablet Take 25 mg by mouth 3 (three) times daily.    Marland Kitchen ketoconazole (NIZORAL) 2 % shampoo     . lidocaine (XYLOCAINE) 5 % ointment Apply 1 application topically as needed. 35.44 g 0  . lisinopril (PRINIVIL,ZESTRIL) 10 MG tablet Take 10 mg by mouth daily.    Marland Kitchen neomycin-polymyxin-hydrocortisone (CORTISPORIN) OTIC solution Apply 2-3 drops to the ingrown toenail site twice daily. Cover with band-aid. 10 mL 0  . nystatin (NYSTATIN) powder Apply 1 g topically 3 (three) times daily.    .  ondansetron (ZOFRAN) 4 MG tablet Take 1 tablet (4 mg total) by mouth every 6 (six) hours as needed for nausea. 20 tablet 0  . polyethylene glycol (MIRALAX / GLYCOLAX) packet Take 17 g by mouth daily as needed for moderate constipation.    . predniSONE (STERAPRED UNI-PAK 21 TAB) 10 MG (21) TBPK tablet Take as directed 21 tablet 0  . senna (SENOKOT) 8.6 MG TABS tablet Take 2 tablets (17.2 mg total) by mouth at bedtime. 120 each 0  . sertraline (ZOLOFT) 25 MG tablet Take 25 mg by mouth daily.    . sertraline (ZOLOFT) 50 MG tablet Take 50 mg by mouth daily.    . tamsulosin (FLOMAX) 0.4 MG CAPS capsule Take 0.4 mg by mouth daily.    Marland Kitchen tiZANidine (ZANAFLEX) 4 MG tablet Take 4 mg by mouth 2 (two) times daily.     No current facility-administered medications on file prior to visit.     Allergies  Allergen Reactions  . Penicillins Anaphylaxis and Other (See Comments)    Has patient had a PCN reaction causing immediate rash, facial/tongue/throat swelling, SOB or lightheadedness with hypotension: Yes Has patient had a PCN reaction causing severe rash involving mucus membranes or skin necrosis: No Has patient had a PCN reaction that required hospitalization: No Has patient had a PCN reaction occurring within the last 10 years: No If all of the above answers are "NO", then may proceed with Cephalosporin use.  . Prozac [Fluoxetine Hcl] Hives, Swelling and Other (See Comments)    Facial swelling    Objective:  General: Well developed, nourished, in no acute distress, alert and oriented x3   Dermatology: Skin is warm, dry and supple bilateral.  Bilateral hallux medial/lateral nail bed appears to be clean, dry, with mild granular tissue and surrounding eschar/scab.  Decreased erythema.  Minimal edema.  Minimal serosanguous drainage present.  Advised patient that the tinge to her toenail is likely from soaking.  The remaining nails appear unremarkable at this time. There are no other lesions or other signs  of infection  Present.  Vascular: Dorsalis Pedis artery and Posterior Tibial artery pedal pulses are 1/4 bilateral with immedate capillary fill time.  There is mild pedal hair growth noted with chronic edema and varicosities noted bilateral right greater than left.  Neruologic: Grossly intact via light touch bilateral.  Chronic burning sensations to feet with the right foot and ankle being most symptomatic due to history of trauma.  Musculoskeletal: Tenderness to palpation of the right and left hallux medial and lateral nail fold(s).  Regarding pain and limitation on the right ankle due to history of fracture.  Assesement and Plan: Problem List Items Addressed This Visit    None    Visit Diagnoses  Ingrown toenail    -  Primary   Toe pain, bilateral       History of chronic pain       Traumatic arthritis       History of ankle fracture          -Examined patient  -Cleansed right/left hallux medial/lateral nail folds were gently scrubbed with peroxide and q-tip/curetted away eschar at site and applied antibiotic cream covered with bandaid.  -Discussed plan of care with patient. -Patient to now begin soaking in a weak solution of Epsom salt and warm water. Patient was instructed to soak for 15-20 minutes each day until the toe appears normal and there is no drainage, redness, tenderness, or swelling at the procedure site, and apply neosporin and a gauze or bandaid dressing each day as needed. May leave open to air at night. -Educated patient on long term care after nail surgery. -Patient was instructed to monitor the toe for reoccurrence and signs of infection; Patient advised to return to office or go to ER if toe becomes red, hot or swollen. -Continue with pain management follow-up for chronic ankle pain issues and advised patient that she may return occasionally for a steroid injection but can only get about 3/year -Patient is to return as needed or sooner if problems  arise.  Landis Martins, DPM

## 2019-03-05 DIAGNOSIS — G8929 Other chronic pain: Secondary | ICD-10-CM | POA: Insufficient documentation

## 2019-03-06 ENCOUNTER — Other Ambulatory Visit: Payer: Self-pay

## 2019-03-06 ENCOUNTER — Ambulatory Visit (INDEPENDENT_AMBULATORY_CARE_PROVIDER_SITE_OTHER): Payer: Medicare Other | Admitting: Sports Medicine

## 2019-03-06 ENCOUNTER — Encounter: Payer: Self-pay | Admitting: Sports Medicine

## 2019-03-06 DIAGNOSIS — M25571 Pain in right ankle and joints of right foot: Secondary | ICD-10-CM

## 2019-03-06 DIAGNOSIS — M1250 Traumatic arthropathy, unspecified site: Secondary | ICD-10-CM | POA: Diagnosis not present

## 2019-03-06 DIAGNOSIS — M779 Enthesopathy, unspecified: Secondary | ICD-10-CM

## 2019-03-06 DIAGNOSIS — B351 Tinea unguium: Secondary | ICD-10-CM

## 2019-03-06 MED ORDER — TRIAMCINOLONE ACETONIDE 10 MG/ML IJ SUSP
10.0000 mg | Freq: Once | INTRAMUSCULAR | Status: AC
  Administered 2019-03-06: 16:00:00 10 mg

## 2019-03-06 MED ORDER — CICLOPIROX 8 % EX SOLN: Freq: Every day | CUTANEOUS | 0 refills | Status: DC

## 2019-03-06 NOTE — Progress Notes (Signed)
Subjective: Diana Oneill is a 63 y.o. female patient who returns to office for evaluation of right ankle pain. Patient reports that the injection helped her ankle and reports that she has seen pain management who put her on Lyrica for her ankle pain.  Patient reports that the pain is sharp and constant over the dorsal and lateral side of the ankle with some swelling worse with a lot of walking has been using ankle brace resting icing and elevating.  Reports that injections helped for several months.  Patient also reports that she wants to discuss further treatment for her thick toenails.  Patient denies any other pedal complaints at this time.  Patient Active Problem List   Diagnosis Date Noted  . Closed displaced trimalleolar fracture of right ankle 01/05/2017  . Acute encephalopathy 10/25/2016  . Essential hypertension 10/25/2016  . Obesity 10/25/2016  . Acidosis 10/25/2016  . Nausea without vomiting 10/25/2016  . Tachycardia 10/25/2016  . COPD (chronic obstructive pulmonary disease) ([place]) 10/25/2016  . Acute respiratory failure ([place]) 10/25/2016  . Hyponatremia   . Acute kidney injury ([place])   . Anemia   . Thrombocytopenia ([place])   . Osteoarthritis of left knee 10/23/2016  . Bipolar disorder, now depressed ([place]) 08/10/2014  . Cocaine use disorder, mild, in early remission ([place]) 08/10/2014    Current Outpatient Medications on File Prior to Visit  Medication Sig Dispense Refill  . furosemide (LASIX) 40 MG tablet furosemide 40 mg tablet    . acetaminophen (TYLENOL) 325 MG tablet Take 650 mg by mouth every 8 (eight) hours as needed for moderate pain or fever.    Marland Kitchen albuterol (VENTOLIN HFA) 108 (90 Base) MCG/ACT inhaler INHALE TWO PUFFS EVERY 6 HOURS AS NEEDED    . alendronate (FOSAMAX) 70 MG tablet take 1 tablet by mouth once weekly on empty stomach with 8 ounces of water. must sit upright for 30 minutes    . amLODipine (NORVASC) 5 MG tablet Take 5 mg by mouth daily.    Marland Kitchen aspirin  81 MG chewable tablet Chew 1 tablet (81 mg total) by mouth 2 (two) times daily. 60 tablet 1  . azithromycin (ZITHROMAX) 250 MG tablet TAKE 2 TABLETS BY MOUTH ON DAY 1, THEN TAKE 1 TABLET DAILY ON DAYS 2-5    . celecoxib (CELEBREX) 200 MG capsule TAKE ONE CAPSULE BY MOUTH DAILY WITH FOOD    . CHANTIX STARTING MONTH PAK 0.5 MG X 11 & 1 MG X 42 tablet     . chlorhexidine (PERIDEX) 0.12 % solution     . citalopram (CELEXA) 40 MG tablet Take 40 mg by mouth daily.    . clindamycin (CLEOCIN) 300 MG capsule TK ONE C PO  Q 6 H    . clonazePAM (KLONOPIN) 0.5 MG tablet Take 0.5 mg by mouth every 8 (eight) hours as needed for anxiety.     . COMBIVENT RESPIMAT 20-100 MCG/ACT AERS respimat USE ONE INHALATION FOUR TIMES DAILY AS NEEDED    . diclofenac sodium (VOLTAREN) 1 % GEL Apply topically 2 (two) times daily.    . divalproex (DEPAKOTE) 500 MG DR tablet Take 500 mg by mouth 2 (two) times daily.    Marland Kitchen docusate sodium (COLACE) 100 MG capsule Take 1 capsule (100 mg total) by mouth 2 (two) times daily. 60 capsule 1  . fluconazole (DIFLUCAN) 200 MG tablet fluconazole 200 mg tablet    . fluticasone (FLONASE) 50 MCG/ACT nasal spray Place 2 sprays into both nostrils 2 (two) times daily.    Marland Kitchen  gabapentin (NEURONTIN) 300 MG capsule Take 300 mg by mouth 3 (three) times daily.     Marland Kitchen gabapentin (NEURONTIN) 600 MG tablet Take 600 mg by mouth 4 (four) times daily.    Michaela Corner AC 100-10 MG/5ML syrup TAKE ONE TEASPOONFUL BY MOUTH TWICE DAILY    . HYDROcodone-acetaminophen (NORCO/VICODIN) 5-325 MG tablet TAKE ONE TABLET BY MOUTH ONCE DAILY AS NEEDED    . HYDROmorphone (DILAUDID) 2 MG tablet Take 1 tablet (2 mg total) by mouth every 4 (four) hours as needed for severe pain. 60 tablet 0  . hydrOXYzine (ATARAX/VISTARIL) 10 MG tablet Take 10 mg by mouth 3 (three) times daily.    . hydrOXYzine (ATARAX/VISTARIL) 25 MG tablet Take 25 mg by mouth 3 (three) times daily.    Marland Kitchen ketoconazole (NIZORAL) 2 % shampoo     . lidocaine  (XYLOCAINE) 5 % ointment Apply 1 application topically as needed. 35.44 g 0  . lisinopril (PRINIVIL,ZESTRIL) 10 MG tablet Take 10 mg by mouth daily.    Marland Kitchen neomycin-polymyxin-hydrocortisone (CORTISPORIN) OTIC solution Apply 2-3 drops to the ingrown toenail site twice daily. Cover with band-aid. 10 mL 0  . nystatin (NYSTATIN) powder Apply 1 g topically 3 (three) times daily.    . ondansetron (ZOFRAN) 4 MG tablet Take 1 tablet (4 mg total) by mouth every 6 (six) hours as needed for nausea. 20 tablet 0  . phentermine (ADIPEX-P) 37.5 MG tablet phentermine 37.5 mg tablet  TAKE 1/2 TABLET BY MOUTH EVERY MORNING    . polyethylene glycol (MIRALAX / GLYCOLAX) packet Take 17 g by mouth daily as needed for moderate constipation.    . predniSONE (STERAPRED UNI-PAK 21 TAB) 10 MG (21) TBPK tablet Take as directed 21 tablet 0  . senna (SENOKOT) 8.6 MG TABS tablet Take 2 tablets (17.2 mg total) by mouth at bedtime. 120 each 0  . sertraline (ZOLOFT) 25 MG tablet Take 25 mg by mouth daily.    . sertraline (ZOLOFT) 50 MG tablet Take 50 mg by mouth daily.    . tamsulosin (FLOMAX) 0.4 MG CAPS capsule Take 0.4 mg by mouth daily.    Marland Kitchen tiZANidine (ZANAFLEX) 4 MG tablet Take 4 mg by mouth 2 (two) times daily.    Marland Kitchen triamcinolone cream (KENALOG) 0.5 % triamcinolone acetonide 0.5 % topical cream     No current facility-administered medications on file prior to visit.     Allergies  Allergen Reactions  . Penicillins Anaphylaxis and Other (See Comments)    Has patient had a PCN reaction causing immediate rash, facial/tongue/throat swelling, SOB or lightheadedness with hypotension: Yes Has patient had a PCN reaction causing severe rash involving mucus membranes or skin necrosis: No Has patient had a PCN reaction that required hospitalization: No Has patient had a PCN reaction occurring within the last 10 years: No If all of the above answers are "NO", then may proceed with Cephalosporin use.  . Prozac [Fluoxetine Hcl]  Hives, Swelling and Other (See Comments)    Facial swelling  . Codeine     nausea    Objective:  General: Alert and oriented x3 in no acute distress  Dermatology: Surgical scars well-healed.  No open lesions bilateral lower extremities, no webspace macerations, no ecchymosis bilateral, all nails x 8 are well manicured short and thickened.  Vascular: Dorsalis Pedis and Posterior Tibial pedal pulses palpable, Capillary Fill Time 3 seconds,(-) pedal hair growth bilateral, no edema bilateral lower extremities, venous hyperpigmentation and varicosities especially on the right, temperature gradient within normal  limits.  Neurology: Gross sensation intact via light touch bilateral, subjective burning sensation along the posterior tibial nerve distribution of the right foot and ankle like before currently controlled with Lyrica.  Musculoskeletal: Tenderness palpation at the right ankle at the dorsal lateral aspect history of ankle fracture.  Severely limited range of motion on right.   Problem List Items Addressed This Visit    None    Visit Diagnoses    Traumatic arthritis    -  Primary   Relevant Medications   triamcinolone acetonide (KENALOG) 10 MG/ML injection 10 mg   Capsulitis       Acute right ankle pain       Nail fungus       Relevant Medications   fluconazole (DIFLUCAN) 200 MG tablet   ciclopirox (PENLAC) 8 % solution     -Complete examination performed -Discussed treatement options for traumatic arthritis with capsulitis and nail fungus -After oral consent and aseptic prep, injected a mixture containing 1 ml of 2%  plain lidocaine, 1 ml 0.5% plain marcaine, 0.5 ml of kenalog 10 and 0.5 ml of dexamethasone phosphate into right ankle without complication. Post-injection care discussed with patient.  -Rx Penlac solution to use to toenails -Continue with pain management recommendations -Continue with good supportive shoes and ankle brace; dispensed new Tri-Lock ankle brace this  visit -Patient to return to office as needed or sooner if condition worsens. Landis Martins, DPM

## 2019-04-14 ENCOUNTER — Encounter: Payer: Self-pay | Admitting: *Deleted

## 2019-04-14 ENCOUNTER — Ambulatory Visit: Payer: Medicare Other | Admitting: Cardiology

## 2019-04-18 ENCOUNTER — Ambulatory Visit (INDEPENDENT_AMBULATORY_CARE_PROVIDER_SITE_OTHER): Payer: Medicare Other | Admitting: Cardiology

## 2019-04-18 ENCOUNTER — Telehealth: Payer: Self-pay

## 2019-04-18 ENCOUNTER — Encounter: Payer: Self-pay | Admitting: Cardiology

## 2019-04-18 ENCOUNTER — Other Ambulatory Visit: Payer: Self-pay

## 2019-04-18 VITALS — BP 124/80 | HR 67 | Ht 65.0 in | Wt 206.8 lb

## 2019-04-18 DIAGNOSIS — F17210 Nicotine dependence, cigarettes, uncomplicated: Secondary | ICD-10-CM | POA: Diagnosis not present

## 2019-04-18 DIAGNOSIS — I10 Essential (primary) hypertension: Secondary | ICD-10-CM

## 2019-04-18 DIAGNOSIS — E663 Overweight: Secondary | ICD-10-CM | POA: Diagnosis not present

## 2019-04-18 DIAGNOSIS — Z01810 Encounter for preprocedural cardiovascular examination: Secondary | ICD-10-CM | POA: Diagnosis not present

## 2019-04-18 NOTE — Telephone Encounter (Signed)
Post office visit, echocardiogram and lexiscan procedures ordered. Pt states she will call back to schedule tests, she doesn't have time right now. Lexiscan instructions given, she verbalizes understanding.

## 2019-04-18 NOTE — Patient Instructions (Signed)
Medication Instructions:  Your physician recommends that you continue on your current medications as directed. Please refer to the Current Medication list given to you today.  *If you need a refill on your cardiac medications before your next appointment, please call your pharmacy*  Lab Work: None Ordered .  Testing/Procedures: Your physician has requested that you have an echocardiogram. Echocardiography is a painless test that uses sound waves to create images of your heart. It provides your doctor with information about the size and shape of your heart and how well your heart's chambers and valves are working. This procedure takes approximately one hour. There are no restrictions for this procedure.  Your physician has requested that you have a lexiscan myoview. For further information please visit [email or web address]. Please follow instruction sheet, as given.    Follow-Up: At [facility], you and your health needs are our priority.  As part of our continuing mission to provide you with exceptional heart care, we have created designated Provider Care Teams.  These Care Teams include your primary Cardiologist (physician) and Advanced Practice Providers (APPs -  Physician Assistants and Nurse Practitioners) who all work together to provide you with the care you need, when you need it.  Your next appointment:   As needed  The format for your next appointment:   In Person  Provider:   Jyl Heinz, MD  Other Instructions

## 2019-04-18 NOTE — Progress Notes (Signed)
Cardiology Office Note:    Date:  04/18/2019   ID:  Diana Oneill, DOB 20-Mar-1956, MRN [id number]  PCP:  Celedonio Miyamoto (Inactive)  Cardiologist:  Jenean Lindau, MD   Referring MD: No ref. provider found    ASSESSMENT:    1. Essential hypertension   2. Preoperative cardiovascular examination   3. Overweight   4. Cigarette smoker    PLAN:    In order of problems listed above:  1. Preoperative cardiovascular stratification: Patient has multiple risk factors for coronary artery disease and leads a sedentary lifestyle.  In view of this I discussed with her preoperatively stratification.  Her EKG is unremarkable.  I would prefer to do a Lexiscan sestamibi.  If this is negative then she is not at high risk for coronary events during the aforementioned surgery.  Meticulous hemodynamic monitoring will further reduce the risk of coronary event. 2. Essential hypertension: Blood pressure stable.  Echocardiogram will be done to assess murmur on auscultation 3. Overweight: I discussed diet for reducing weight this will help her.  Recovery also when she promises to do better. 4. Cigarette smoker: I spent 5 minutes with the patient discussing solely about smoking. Smoking cessation was counseled. I suggested to the patient also different medications and pharmacological interventions. Patient is keen to try stopping on its own at this time. He will get back to me if he needs any further assistance in this matter. 5. She will be seen in follow-up on a as needed basis.  Patient had multiple questions which were answered to her satisfaction.   Medication Adjustments/Labs and Tests Ordered: Current medicines are reviewed at length with the patient today.  Concerns regarding medicines are outlined above.  No orders of the defined types were placed in this encounter.  No orders of the defined types were placed in this encounter.    History of Present Illness:    Diana Oneill  is a 63 y.o. female who is being seen today for the evaluation of preoperative cardiovascular stratification at the request of her primary care physician.  Patient is a pleasant 63 year old female.  She has past medical history of essential hypertension, overweight status, dyslipidemia and cigarette smoking.  She is planning to undergo knee replacement surgery and therefore she was sent here for evaluation.  She denies any chest pain orthopnea or PND.  She leads a sedentary lifestyle.  At the time of my evaluation, the patient is alert awake oriented and in no distress.  Past Medical History:  Diagnosis Date  . Acidosis 10/25/2016  . Acute encephalopathy 10/25/2016  . Acute kidney injury ([place])   . Acute respiratory failure ([place]) 10/25/2016  . Anemia   . Ankle pain 07/10/2017  . Anxiety   . Arthritis   . Bipolar disorder, now depressed ([place]) 08/10/2014  . Cancer ([place])    skin cancer, uterine cancer  . Closed displaced trimalleolar fracture of right ankle 01/05/2017  . Cocaine use disorder, mild, in early remission ([place]) 08/10/2014  . COPD (chronic obstructive pulmonary disease) ([place])   . Depression   . Dyspnea    with exertion  . Essential hypertension 10/25/2016  . History of kidney stones   . Hypertension   . Hyponatremia   . Nausea without vomiting 10/25/2016  . Obesity 10/25/2016  . Osteoarthritis   . Osteoarthritis of left knee 10/23/2016  . Sleep apnea    does not use CPAP "Makes too much Noise"  . Tachycardia 10/25/2016  . Thrombocytopenia ([place])  09/2016    Past Surgical History:  Procedure Laterality Date  . ABDOMINAL HYSTERECTOMY    . ACHILLES TENDON REPAIR Left   . GASTRIC BYPASS    . JOINT REPLACEMENT Right    knee replace  . KNEE ARTHROPLASTY Left 10/23/2016   Procedure: COMPUTER ASSISTED TOTAL KNEE ARTHROPLASTY;  Surgeon: Rod Can, MD;  Location: [facility];  Service: Orthopedics;  Laterality: Left;  . ORIF ANKLE FRACTURE Right 01/06/2017   Procedure: OPEN REDUCTION INTERNAL  FIXATION (ORIF) ANKLE FRACTURE;  Surgeon: Wylene Simmer, MD;  Location: [facility];  Service: Orthopedics;  Laterality: Right;    Current Medications: Current Meds  Medication Sig  . acetaminophen (TYLENOL) 325 MG tablet Take 650 mg by mouth every 8 (eight) hours as needed for moderate pain or fever.  Marland Kitchen albuterol (VENTOLIN HFA) 108 (90 Base) MCG/ACT inhaler INHALE TWO PUFFS EVERY 6 HOURS AS NEEDED  . alendronate (FOSAMAX) 70 MG tablet take 1 tablet by mouth once weekly on empty stomach with 8 ounces of water. must sit upright for 30 minutes  . amLODipine (NORVASC) 5 MG tablet Take 5 mg by mouth daily.  Marland Kitchen aspirin 81 MG chewable tablet Chew 1 tablet (81 mg total) by mouth 2 (two) times daily.  . COMBIVENT RESPIMAT 20-100 MCG/ACT AERS respimat USE ONE INHALATION FOUR TIMES DAILY AS NEEDED  . diclofenac sodium (VOLTAREN) 1 % GEL Apply topically 2 (two) times daily.  Marland Kitchen docusate sodium (COLACE) 100 MG capsule Take 1 capsule (100 mg total) by mouth 2 (two) times daily.  . fluconazole (DIFLUCAN) 200 MG tablet fluconazole 200 mg tablet  . fluticasone (FLONASE) 50 MCG/ACT nasal spray Place 2 sprays into both nostrils 2 (two) times daily.  . furosemide (LASIX) 40 MG tablet furosemide 40 mg tablet  . gabapentin (NEURONTIN) 600 MG tablet Take 600 mg by mouth 4 (four) times daily.  Marland Kitchen HYDROcodone-acetaminophen (NORCO/VICODIN) 5-325 MG tablet TAKE ONE TABLET BY MOUTH ONCE DAILY AS NEEDED  . hydrOXYzine (ATARAX/VISTARIL) 25 MG tablet Take 25 mg by mouth 3 (three) times daily.  Marland Kitchen ketoconazole (NIZORAL) 2 % shampoo   . lidocaine (XYLOCAINE) 5 % ointment Apply 1 application topically as needed.  Marland Kitchen lisinopril (PRINIVIL,ZESTRIL) 10 MG tablet Take 10 mg by mouth daily.  . ondansetron (ZOFRAN) 4 MG tablet Take 1 tablet (4 mg total) by mouth every 6 (six) hours as needed for nausea.  . polyethylene glycol (MIRALAX / GLYCOLAX) packet Take 17 g by mouth daily as needed for moderate constipation.  . pregabalin (LYRICA) 50 MG  capsule TK 1 C PO Q 8 H FOR 15 DAYS THEN TK 1 C PO Q 6 H FOR 15 DAYS  . senna (SENOKOT) 8.6 MG TABS tablet Take 2 tablets (17.2 mg total) by mouth at bedtime.  Marland Kitchen tiZANidine (ZANAFLEX) 4 MG tablet Take 4 mg by mouth 2 (two) times daily.  Marland Kitchen triamcinolone cream (KENALOG) 0.5 % triamcinolone acetonide 0.5 % topical cream     Allergies:   Penicillins and Prozac [fluoxetine hcl]   Social History   Socioeconomic History  . Marital status: Married    Spouse name: Not on file  . Number of children: Not on file  . Years of education: Not on file  . Highest education level: Not on file  Occupational History  . Not on file  Tobacco Use  . Smoking status: Former Smoker    Packs/day: 1.00    Years: 40.00    Pack years: 40.00    Types: Cigarettes  Quit date: 07/30/2016    Years since quitting: 2.7  . Smokeless tobacco: Never Used  . Tobacco comment: every once in a while she may have 1   Substance and Sexual Activity  . Alcohol use: Never  . Drug use: Yes  . Sexual activity: Not on file  Other Topics Concern  . Not on file  Social History Narrative  . Not on file   Social Determinants of Health   Financial Resource Strain:   . Difficulty of Paying Living Expenses: Not on file  Food Insecurity:   . Worried About Charity fundraiser in the Last Year: Not on file  . Ran Out of Food in the Last Year: Not on file  Transportation Needs:   . Lack of Transportation (Medical): Not on file  . Lack of Transportation (Non-Medical): Not on file  Physical Activity:   . Days of Exercise per Week: Not on file  . Minutes of Exercise per Session: Not on file  Stress:   . Feeling of Stress : Not on file  Social Connections:   . Frequency of Communication with Friends and Family: Not on file  . Frequency of Social Gatherings with Friends and Family: Not on file  . Attends Religious Services: Not on file  . Active Member of Clubs or Organizations: Not on file  . Attends Archivist  Meetings: Not on file  . Marital Status: Not on file     Family History: The patient's family history includes Arthritis in her father; Hyperlipidemia in her mother; Hypertension in her mother.  ROS:   Please see the history of present illness.    All other systems reviewed and are negative.  EKGs/Labs/Other Studies Reviewed:    The following studies were reviewed today: EKG with sinus rhythm and nonspecific ST-T changes.   Recent Labs: No results found for requested labs within last 8760 hours.  Recent Lipid Panel No results found for: CHOL, TRIG, HDL, CHOLHDL, VLDL, LDLCALC, LDLDIRECT  Physical Exam:    VS:  BP 124/80   Pulse 67   Ht 5\' 5"  (1.651 m)   Wt 206 lb 12.8 oz (93.8 kg)   SpO2 98%   BMI 34.41 kg/m     Wt Readings from Last 3 Encounters:  04/18/19 206 lb 12.8 oz (93.8 kg)  01/05/17 229 lb 4.8 oz (104 kg)  10/27/16 249 lb 1.9 oz (113 kg)     GEN: Patient is in no acute distress HEENT: Normal NECK: No JVD; No carotid bruits LYMPHATICS: No lymphadenopathy CARDIAC: S1 S2 regular, 2/6 systolic murmur at the apex. RESPIRATORY:  Clear to auscultation without rales, wheezing or rhonchi  ABDOMEN: Soft, non-tender, non-distended MUSCULOSKELETAL:  No edema; No deformity  SKIN: Warm and dry NEUROLOGIC:  Alert and oriented x 3 PSYCHIATRIC:  Normal affect    Signed, Jenean Lindau, MD  04/18/2019 4:27 PM    [facility]

## 2019-04-18 NOTE — Addendum Note (Signed)
Addended by: Particia Nearing B on: 04/18/2019 05:03 PM   Modules accepted: Orders

## 2019-04-21 DIAGNOSIS — B342 Coronavirus infection, unspecified: Secondary | ICD-10-CM | POA: Insufficient documentation

## 2019-05-02 HISTORY — PX: ROTATOR CUFF REPAIR: SHX139

## 2019-06-04 ENCOUNTER — Encounter: Payer: Self-pay | Admitting: Sports Medicine

## 2019-06-04 ENCOUNTER — Ambulatory Visit (INDEPENDENT_AMBULATORY_CARE_PROVIDER_SITE_OTHER): Payer: Medicare Other | Admitting: Sports Medicine

## 2019-06-04 ENCOUNTER — Other Ambulatory Visit: Payer: Self-pay

## 2019-06-04 DIAGNOSIS — M79674 Pain in right toe(s): Secondary | ICD-10-CM | POA: Diagnosis not present

## 2019-06-04 DIAGNOSIS — M79675 Pain in left toe(s): Secondary | ICD-10-CM

## 2019-06-04 DIAGNOSIS — M1250 Traumatic arthropathy, unspecified site: Secondary | ICD-10-CM

## 2019-06-04 DIAGNOSIS — M779 Enthesopathy, unspecified: Secondary | ICD-10-CM

## 2019-06-04 DIAGNOSIS — B351 Tinea unguium: Secondary | ICD-10-CM

## 2019-06-04 DIAGNOSIS — M25571 Pain in right ankle and joints of right foot: Secondary | ICD-10-CM

## 2019-06-04 DIAGNOSIS — Z87898 Personal history of other specified conditions: Secondary | ICD-10-CM

## 2019-06-04 MED ORDER — TRIAMCINOLONE ACETONIDE 40 MG/ML IJ SUSP
20.0000 mg | Freq: Once | INTRAMUSCULAR | Status: AC
  Administered 2019-06-04: 20 mg

## 2019-06-04 NOTE — Progress Notes (Signed)
Subjective: Diana Oneill is a 64 y.o. female patient who returns to office for evaluation of right ankle pain. Patient reports that she is having constant pain in ankle 7-10/10 can barely walk at time and reports that her Pain doctors are no longer giving her pain meds. Patient also reports that for her nails she could not afford the topical and was not covered at her pharmacy.  Patient Active Problem List   Diagnosis Date Noted  . Coronavirus infection 04/21/2019  . Preoperative cardiovascular examination 04/18/2019  . Overweight 04/18/2019  . Cigarette smoker 04/18/2019  . Chronic knee pain after total replacement of left knee joint 03/05/2019  . Ankle pain 07/10/2017  . Closed displaced trimalleolar fracture of right ankle 01/05/2017  . Acute encephalopathy 10/25/2016  . Essential hypertension 10/25/2016  . Obesity 10/25/2016  . Acidosis 10/25/2016  . Nausea without vomiting 10/25/2016  . Tachycardia 10/25/2016  . COPD (chronic obstructive pulmonary disease) ([place]) 10/25/2016  . Acute respiratory failure ([place]) 10/25/2016  . Hyponatremia   . Acute kidney injury ([place])   . Anemia   . Thrombocytopenia ([place])   . Osteoarthritis of left knee 10/23/2016  . Bipolar disorder, now depressed ([place]) 08/10/2014  . Cocaine use disorder, mild, in early remission ([place]) 08/10/2014    Current Outpatient Medications on File Prior to Visit  Medication Sig Dispense Refill  . acetaminophen (TYLENOL) 325 MG tablet Take 650 mg by mouth every 8 (eight) hours as needed for moderate pain or fever.    Marland Kitchen albuterol (VENTOLIN HFA) 108 (90 Base) MCG/ACT inhaler INHALE TWO PUFFS EVERY 6 HOURS AS NEEDED    . alendronate (FOSAMAX) 70 MG tablet take 1 tablet by mouth once weekly on empty stomach with 8 ounces of water. must sit upright for 30 minutes    . amLODipine (NORVASC) 5 MG tablet Take 5 mg by mouth daily.    Marland Kitchen aspirin 81 MG chewable tablet Chew 1 tablet (81 mg total) by mouth 2 (two) times daily.  60 tablet 1  . COMBIVENT RESPIMAT 20-100 MCG/ACT AERS respimat USE ONE INHALATION FOUR TIMES DAILY AS NEEDED    . dexamethasone (DECADRON) 4 MG tablet Take 4 mg by mouth daily.    . diclofenac sodium (VOLTAREN) 1 % GEL Apply topically 2 (two) times daily.    Marland Kitchen docusate sodium (COLACE) 100 MG capsule Take 1 capsule (100 mg total) by mouth 2 (two) times daily. 60 capsule 1  . fluconazole (DIFLUCAN) 200 MG tablet fluconazole 200 mg tablet    . fluticasone (FLONASE) 50 MCG/ACT nasal spray Place 2 sprays into both nostrils 2 (two) times daily.    . furosemide (LASIX) 40 MG tablet furosemide 40 mg tablet    . gabapentin (NEURONTIN) 600 MG tablet Take 600 mg by mouth 4 (four) times daily.    Marland Kitchen guaiFENesin-codeine 100-10 MG/5ML syrup Take 5 mLs by mouth 2 (two) times daily.    Marland Kitchen HYDROcodone-acetaminophen (NORCO/VICODIN) 5-325 MG tablet TAKE ONE TABLET BY MOUTH ONCE DAILY AS NEEDED    . hydrOXYzine (ATARAX/VISTARIL) 25 MG tablet Take 25 mg by mouth 3 (three) times daily.    Marland Kitchen ketoconazole (NIZORAL) 2 % shampoo     . lidocaine (XYLOCAINE) 5 % ointment Apply 1 application topically as needed. 35.44 g 0  . lisinopril (PRINIVIL,ZESTRIL) 10 MG tablet Take 10 mg by mouth daily.    . ondansetron (ZOFRAN) 4 MG tablet Take 1 tablet (4 mg total) by mouth every 6 (six) hours as needed for nausea. [place]  tablet 0  . polyethylene glycol (MIRALAX / GLYCOLAX) packet Take 17 g by mouth daily as needed for moderate constipation.    . pregabalin (LYRICA) 50 MG capsule TK 1 C PO Q 8 H FOR 15 DAYS THEN TK 1 C PO Q 6 H FOR 15 DAYS    . senna (SENOKOT) 8.6 MG TABS tablet Take 2 tablets (17.2 mg total) by mouth at bedtime. 120 each 0  . tiZANidine (ZANAFLEX) 4 MG tablet Take 4 mg by mouth 2 (two) times daily.    . traMADol (ULTRAM) 50 MG tablet Take 50 mg by mouth daily.    Marland Kitchen triamcinolone cream (KENALOG) 0.5 % triamcinolone acetonide 0.5 % topical cream     No current facility-administered medications on file prior to visit.     Allergies  Allergen Reactions  . Penicillins Anaphylaxis and Other (See Comments)    Has patient had a PCN reaction causing immediate rash, facial/tongue/throat swelling, SOB or lightheadedness with hypotension: Yes Has patient had a PCN reaction causing severe rash involving mucus membranes or skin necrosis: No Has patient had a PCN reaction that required hospitalization: No Has patient had a PCN reaction occurring within the last 10 years: No If all of the above answers are "NO", then may proceed with Cephalosporin use.  . Prozac [Fluoxetine Hcl] Hives, Swelling and Other (See Comments)    Facial swelling    Objective:  General: Alert and oriented x3 in no acute distress  Dermatology: Surgical scars well-healed.  No open lesions bilateral lower extremities, no webspace macerations, no ecchymosis bilateral, all nails x 8 are well manicured long and thickened.  Vascular: Dorsalis Pedis and Posterior Tibial pedal pulses palpable, Capillary Fill Time 3 seconds,(-) pedal hair growth bilateral, no edema bilateral lower extremities, venous hyperpigmentation and varicosities especially on the right, temperature gradient within normal limits.  Neurology: Gross sensation intact via light touch bilateral, subjective burning sensation along the posterior tibial nerve distribution of the right foot and ankle like before currently controlled with Lyrica.  Musculoskeletal: Tenderness palpation at the right ankle at the dorsal lateral aspect history of ankle fracture.  Severely limited range of motion on right.   Problem List Items Addressed This Visit      Other   Ankle pain    Other Visit Diagnoses    Capsulitis    -  Primary   Relevant Medications   triamcinolone acetonide (KENALOG-40) injection 20 mg (Completed) (Start on 06/04/2019  6:15 PM)   Traumatic arthritis       History of chronic pain       Toe pain, bilateral       Nail fungus       Relevant Orders   Culture, fungus without  smear     -Complete examination performed -Discussed treatement options for traumatic arthritis with capsulitis and nail fungus -After oral consent and aseptic prep, injected a mixture containing 1 ml of 2%  plain lidocaine, 1 ml 0.5% plain marcaine, 0.5 ml of kenalog 40 and 0.5 ml of dexamethasone phosphate into right ankle without complication. Post-injection care discussed with patient. This is injection #1 for the year  -Advised patient that I can not give pain medication for this concern she will have to follow up with her pain doctor or PCP -Continue with good supportive shoes and ankle brace as tolerated and advised patient if her ankle on right continues to be painful to see ortho to discuss fusion vs implant arthroplasty -Fungal culture was obtained by  removing a portion of the hard nail itself from each of the involved toenails 2-5 bilateral using a sterile nail nipper and sent to [facility] lab. Patient tolerated the biopsy procedure well without discomfort or need for anesthesia. WILL CALL HER WITH RESULTS to discuss treatment options  -Patient to return to office as needed or sooner if condition worsens. Landis Martins, DPM

## 2019-06-20 ENCOUNTER — Telehealth: Payer: Self-pay

## 2019-06-20 NOTE — Telephone Encounter (Signed)
-----   Message from Landis Martins, [place] sent at 06/20/2019  7:11 AM EST ----- Regarding: Fungal Culture results Will you let patient know that her culture results are NEGATIVE for fungus. Her nails are thick because of the way she walks and stands and from pressure from her shoes. Patient should continue with self trimming and filing and may use vicks vapor rub at bedtime to nails to help with thickness or may purchase from our office Tolcylen nail gel Thanks Dr. Chauncey Cruel

## 2019-06-20 NOTE — Telephone Encounter (Signed)
Called and spoke with pt regarding nail cx results and Dr.'s recommendations. Pt stated understanding and states she will speak to Dr. Cannon Kettle at her next apt.

## 2019-06-23 DIAGNOSIS — N183 Chronic kidney disease, stage 3 unspecified: Secondary | ICD-10-CM | POA: Insufficient documentation

## 2019-06-23 DIAGNOSIS — J4540 Moderate persistent asthma, uncomplicated: Secondary | ICD-10-CM | POA: Insufficient documentation

## 2019-06-23 DIAGNOSIS — J302 Other seasonal allergic rhinitis: Secondary | ICD-10-CM | POA: Insufficient documentation

## 2019-06-23 DIAGNOSIS — Z720 Tobacco use: Secondary | ICD-10-CM | POA: Insufficient documentation

## 2019-06-25 ENCOUNTER — Telehealth (HOSPITAL_COMMUNITY): Payer: Self-pay | Admitting: *Deleted

## 2019-06-25 NOTE — Telephone Encounter (Signed)
Patient given detailed instructions per Myocardial Perfusion Study Information Sheet for the test on 07/02/19. Patient notified to arrive 15 minutes early and that it is imperative to arrive on time for appointment to keep from having the test rescheduled.  If you need to cancel or reschedule your appointment, please call the office within 24 hours of your appointment. . Patient verbalized understanding. Diana Oneill    

## 2019-09-12 ENCOUNTER — Encounter: Payer: Self-pay | Admitting: Sports Medicine

## 2019-09-12 ENCOUNTER — Ambulatory Visit: Payer: Medicare Other | Admitting: Sports Medicine

## 2019-09-12 ENCOUNTER — Other Ambulatory Visit: Payer: Self-pay

## 2019-09-12 ENCOUNTER — Ambulatory Visit (INDEPENDENT_AMBULATORY_CARE_PROVIDER_SITE_OTHER): Payer: Medicare Other | Admitting: Sports Medicine

## 2019-09-12 DIAGNOSIS — M25571 Pain in right ankle and joints of right foot: Secondary | ICD-10-CM

## 2019-09-12 DIAGNOSIS — Z87898 Personal history of other specified conditions: Secondary | ICD-10-CM

## 2019-09-12 DIAGNOSIS — M779 Enthesopathy, unspecified: Secondary | ICD-10-CM | POA: Diagnosis not present

## 2019-09-12 DIAGNOSIS — F3131 Bipolar disorder, current episode depressed, mild: Secondary | ICD-10-CM

## 2019-09-12 DIAGNOSIS — M1250 Traumatic arthropathy, unspecified site: Secondary | ICD-10-CM | POA: Diagnosis not present

## 2019-09-12 DIAGNOSIS — J9600 Acute respiratory failure, unspecified whether with hypoxia or hypercapnia: Secondary | ICD-10-CM

## 2019-09-12 DIAGNOSIS — E662 Morbid (severe) obesity with alveolar hypoventilation: Secondary | ICD-10-CM | POA: Insufficient documentation

## 2019-09-12 DIAGNOSIS — B351 Tinea unguium: Secondary | ICD-10-CM

## 2019-09-12 DIAGNOSIS — D696 Thrombocytopenia, unspecified: Secondary | ICD-10-CM

## 2019-09-12 DIAGNOSIS — J431 Panlobular emphysema: Secondary | ICD-10-CM

## 2019-09-12 MED ORDER — TRIAMCINOLONE ACETONIDE 40 MG/ML IJ SUSP
20.0000 mg | Freq: Once | INTRAMUSCULAR | Status: AC
  Administered 2019-09-12: 20 mg

## 2019-09-12 NOTE — Progress Notes (Signed)
Subjective: Diana Oneill is a 64 y.o. female patient who returns to office for evaluation of right ankle pain. Patient her right ankle is starting to hurt again at the front and at the side of the ankle reports that she cannot walk without her Tri-Lock brace states that she is gotten into pain management and they are helping to manage her ankle pain and reports that she is not ready for any type of fusion or ankle replacement implant surgery at this time and reports so far that the steroid injections have been helping.  Patient reports that her toenails are still thick especially the right great toenail but she has been doing the best that she can by keeping it filed and applying Vicks VapoRub to the area.  No other issues noted.  Patient Active Problem List   Diagnosis Date Noted  . Coronavirus infection 04/21/2019  . Preoperative cardiovascular examination 04/18/2019  . Overweight 04/18/2019  . Cigarette smoker 04/18/2019  . Chronic knee pain after total replacement of left knee joint 03/05/2019  . Ankle pain 07/10/2017  . Closed displaced trimalleolar fracture of right ankle 01/05/2017  . Acute encephalopathy 10/25/2016  . Essential hypertension 10/25/2016  . Obesity 10/25/2016  . Acidosis 10/25/2016  . Nausea without vomiting 10/25/2016  . Tachycardia 10/25/2016  . COPD (chronic obstructive pulmonary disease) ([place]) 10/25/2016  . Acute respiratory failure ([place]) 10/25/2016  . Hyponatremia   . Acute kidney injury ([place])   . Anemia   . Thrombocytopenia ([place])   . Osteoarthritis of left knee 10/23/2016  . Bipolar disorder, now depressed ([place]) 08/10/2014  . Cocaine use disorder, mild, in early remission ([place]) 08/10/2014    Current Outpatient Medications on File Prior to Visit  Medication Sig Dispense Refill  . acetaminophen (TYLENOL) 325 MG tablet Take 650 mg by mouth every 8 (eight) hours as needed for moderate pain or fever.    Marland Kitchen albuterol (VENTOLIN HFA) 108 (90 Base) MCG/ACT  inhaler INHALE TWO PUFFS EVERY 6 HOURS AS NEEDED    . alendronate (FOSAMAX) 70 MG tablet take 1 tablet by mouth once weekly on empty stomach with 8 ounces of water. must sit upright for 30 minutes    . amLODipine (NORVASC) 5 MG tablet Take 5 mg by mouth daily.    Marland Kitchen aspirin 81 MG chewable tablet Chew 1 tablet (81 mg total) by mouth 2 (two) times daily. 60 tablet 1  . COMBIVENT RESPIMAT 20-100 MCG/ACT AERS respimat USE ONE INHALATION FOUR TIMES DAILY AS NEEDED    . dexamethasone (DECADRON) 4 MG tablet Take 4 mg by mouth daily.    . diclofenac sodium (VOLTAREN) 1 % GEL Apply topically 2 (two) times daily.    Marland Kitchen docusate sodium (COLACE) 100 MG capsule Take 1 capsule (100 mg total) by mouth 2 (two) times daily. 60 capsule 1  . fluconazole (DIFLUCAN) 200 MG tablet fluconazole 200 mg tablet    . fluticasone (FLONASE) 50 MCG/ACT nasal spray Place 2 sprays into both nostrils 2 (two) times daily.    . furosemide (LASIX) 40 MG tablet furosemide 40 mg tablet    . gabapentin (NEURONTIN) 600 MG tablet Take 600 mg by mouth 4 (four) times daily.    Marland Kitchen guaiFENesin-codeine 100-10 MG/5ML syrup Take 5 mLs by mouth 2 (two) times daily.    Marland Kitchen HYDROcodone-acetaminophen (NORCO/VICODIN) 5-325 MG tablet TAKE ONE TABLET BY MOUTH ONCE DAILY AS NEEDED    . hydrOXYzine (ATARAX/VISTARIL) 25 MG tablet Take 25 mg by mouth 3 (three) times daily.    Marland Kitchen  ketoconazole (NIZORAL) 2 % shampoo     . lidocaine (XYLOCAINE) 5 % ointment Apply 1 application topically as needed. 35.44 g 0  . lisinopril (PRINIVIL,ZESTRIL) 10 MG tablet Take 10 mg by mouth daily.    . ondansetron (ZOFRAN) 4 MG tablet Take 1 tablet (4 mg total) by mouth every 6 (six) hours as needed for nausea. 20 tablet 0  . polyethylene glycol (MIRALAX / GLYCOLAX) packet Take 17 g by mouth daily as needed for moderate constipation.    . pregabalin (LYRICA) 50 MG capsule TK 1 C PO Q 8 H FOR 15 DAYS THEN TK 1 C PO Q 6 H FOR 15 DAYS    . senna (SENOKOT) 8.6 MG TABS tablet Take 2  tablets (17.2 mg total) by mouth at bedtime. 120 each 0  . tiZANidine (ZANAFLEX) 4 MG tablet Take 4 mg by mouth 2 (two) times daily.    . traMADol (ULTRAM) 50 MG tablet Take 50 mg by mouth daily.    Marland Kitchen triamcinolone cream (KENALOG) 0.5 % triamcinolone acetonide 0.5 % topical cream     No current facility-administered medications on file prior to visit.    Allergies  Allergen Reactions  . Penicillins Anaphylaxis and Other (See Comments)    Has patient had a PCN reaction causing immediate rash, facial/tongue/throat swelling, SOB or lightheadedness with hypotension: Yes Has patient had a PCN reaction causing severe rash involving mucus membranes or skin necrosis: No Has patient had a PCN reaction that required hospitalization: No Has patient had a PCN reaction occurring within the last 10 years: No If all of the above answers are "NO", then may proceed with Cephalosporin use.  . Prozac [Fluoxetine Hcl] Hives, Swelling and Other (See Comments)    Facial swelling    Objective:  General: Alert and oriented x3 in no acute distress  Dermatology: Surgical scars well-healed.  No open lesions bilateral lower extremities, no webspace macerations, no ecchymosis bilateral, all nails x 8 are well manicured with nail polish present short and thickened especially right hallux nail but does appear to be improving compared to prior.  Vascular: Dorsalis Pedis and Posterior Tibial pedal pulses palpable, Capillary Fill Time 3 seconds,(-) pedal hair growth bilateral, no edema bilateral lower extremities, venous hyperpigmentation and varicosities especially on the right, temperature gradient within normal limits.  Neurology: Johney Maine sensation intact via light touch bilateral, unchanged burning sensation right foot and ankle currently on Lyrica  Musculoskeletal: Tenderness palpation at the right ankle at the dorsal lateral aspect history of ankle fracture with also some pain to the posterior fibula along the  peroneal tendon complex at today's visit.  Severely limited range of motion on right.   Problem List Items Addressed This Visit      Other   Ankle pain    Other Visit Diagnoses    Capsulitis    -  Primary   Relevant Medications   triamcinolone acetonide (KENALOG-40) injection 20 mg   Traumatic arthritis       Relevant Medications   triamcinolone acetonide (KENALOG-40) injection 20 mg   History of chronic pain       Nail fungus         -Complete examination performed -Re-Discussed treatement options for traumatic arthritis with capsulitis and nail fungus -After oral consent and aseptic prep, injected a mixture containing 1 ml of 2%  plain lidocaine, 1 ml 0.5% plain marcaine, 0.5 ml of kenalog 40 and 0.5 ml of dexamethasone phosphate into right ankle at the anterior lateral  and posterior lateral ankle without complication. Post-injection care discussed with patient. This is injection #2 for the year  -Continue with pain management follow-up -Continue with good supportive shoes and ankle brace; dispensed a new Tri-Lock ankle brace at today's visit to replace the one she got last year that is currently fraying and is worn -Advised patient that previous fungal culture was negative continue with Vicks VapoRub phone filing likely nail changes especially at the right hallux is secondary to the way she walks and stands advised patient that it may take upwards to a year or more to see changes with her nails -Patient to return to office as needed or sooner if condition worsens. Landis Martins, DPM

## 2019-09-23 ENCOUNTER — Encounter: Payer: Self-pay | Admitting: Allergy

## 2019-09-23 ENCOUNTER — Other Ambulatory Visit: Payer: Self-pay

## 2019-09-23 ENCOUNTER — Ambulatory Visit (INDEPENDENT_AMBULATORY_CARE_PROVIDER_SITE_OTHER): Payer: Medicare Other | Admitting: Allergy

## 2019-09-23 VITALS — BP 124/62 | HR 60 | Resp 16 | Ht 64.8 in | Wt 215.8 lb

## 2019-09-23 DIAGNOSIS — Z720 Tobacco use: Secondary | ICD-10-CM

## 2019-09-23 DIAGNOSIS — R431 Parosmia: Secondary | ICD-10-CM

## 2019-09-23 DIAGNOSIS — J3089 Other allergic rhinitis: Secondary | ICD-10-CM

## 2019-09-23 DIAGNOSIS — J449 Chronic obstructive pulmonary disease, unspecified: Secondary | ICD-10-CM

## 2019-09-23 DIAGNOSIS — K9049 Malabsorption due to intolerance, not elsewhere classified: Secondary | ICD-10-CM | POA: Diagnosis not present

## 2019-09-23 MED ORDER — TRELEGY ELLIPTA 200-62.5-25 MCG/INH IN AEPB: INHALATION_SPRAY | RESPIRATORY_TRACT | 5 refills | Status: DC

## 2019-09-23 NOTE — Progress Notes (Signed)
New Patient Note  RE: Diana Oneill MRN: [id number] DOB: 1956-02-16 Date of Office Visit: 09/23/2019  Referring provider: Denzil Magnuson * Primary care provider: Marisue Humble, FNP  Chief Complaint: allergies  History of present illness: Diana Oneill is a 64 y.o. female presenting today for consultation for allergies.   She reports having problems with her allergies.  She states her nose runs like a faucet of clear drainage.  She also feels she swells around her eyes.  She also reports having bad headaches that she notices between her eyes and forehead.  She also reports symptoms of tension headache as well where she feels her "head in a vice".   She reports getting sinus infections about 4-5 times a year and states she does not like to use antibiotics unless truly necessary.   She reports trying Allegra, Zyrtec, Claritin and none of these helped greatly.  At night she takes benadryl which helps with her sleep.  She use flonase 2 sprays each nostril most days of the week.  She states she alternates between flonase and azelastine for which she takes 2 sprays each nostril when she uses it.    She was on allergen immunotherapy until Dec 2020.  She states she was on them for about a year. She was getting weekly injections and got to the point where she was able to get them every other week.  She states then the doctor's office told her they had no one to give the injections as the doctor there went on maternity leave.  Thus she had to stop the allergy shots.  She does feel like she was benefitting from allergy shots as this spring has been much worse than last spring with her symptoms.   Allergy testing was done around 2018-2019 and she states was positive to "a lot of stuff".      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  This of course is worse this spring.  Her allergy  symptoms are year-round but worse during spring and summer.    She had Covid in Dec 2020.  She reports since she has been noticing a strange odor everywhere.  She reports she smells it the most at home.  She is concerned that the smell could be meth as she is concerned that a neighboring house is a Writer" and she has reported her concerns for police department but she states "nothing has been done".  She states she did lose her sense of smell with covid. She reports when she smells this smell it makes her headache worse.  She states her husband has smelled this smell as well but not as much as her.  But also mentions husband had covid with loss of smell as well like she did.    She reports having history of COPD and asthma diagnosed around 1995.  She has a long-standing history of smoking but she is in process of quitting.  She has an albuterol inhaler that she report using about 3-4 times a week.  She has combivent as well and states she may use everyday for cough, shortness of breath. She is on Breo 1 puff once a day and has been on for the past 3 years.   She is on singulair also for past 6 months. She is on chantix and down to 3 cigarettes a day.   She reports milk and  ice cream will cause abomdinal pain and diarrhea.  However states she can eat cheese.   Ongoing for past 3-4 years.     Review of systems: Review of Systems  Constitutional: Negative.   HENT:       See HPI  Eyes: Negative.   Respiratory: Positive for cough and shortness of breath.   Cardiovascular: Negative.   Gastrointestinal: Negative.   Musculoskeletal: Negative.   Skin: Negative.   Neurological:       See HPI    All other systems negative unless noted above in HPI  Past medical history: Past Medical History:  Diagnosis Date  . Acidosis 10/25/2016  . Acute encephalopathy 10/25/2016  . Acute kidney injury ([place])   . Acute respiratory failure ([place]) 10/25/2016  . Anemia   . Ankle pain 07/10/2017  . Anxiety   .  Arthritis   . Asthma   . Bipolar disorder, now depressed ([place]) 08/10/2014  . Cancer ([place])    skin cancer, uterine cancer  . Closed displaced trimalleolar fracture of right ankle 01/05/2017  . Cocaine use disorder, mild, in early remission ([place]) 08/10/2014  . COPD (chronic obstructive pulmonary disease) ([place])   . Depression   . Dyspnea    with exertion  . Essential hypertension 10/25/2016  . History of kidney stones   . Hypertension   . Hyponatremia   . Nausea without vomiting 10/25/2016  . Obesity 10/25/2016  . Osteoarthritis   . Osteoarthritis of left knee 10/23/2016  . Sleep apnea    does not use CPAP "Makes too much Noise"  . Tachycardia 10/25/2016  . Thrombocytopenia ([place]) 09/2016    Past surgical history: Past Surgical History:  Procedure Laterality Date  . ABDOMINAL HYSTERECTOMY  1988  . ACHILLES TENDON REPAIR Left   . CARPAL TUNNEL RELEASE Bilateral 2007  . CATARACT EXTRACTION, BILATERAL  2020  . GASTRIC BYPASS    . JOINT REPLACEMENT Right    knee replace  . KNEE ARTHROPLASTY Left 10/23/2016   Procedure: COMPUTER ASSISTED TOTAL KNEE ARTHROPLASTY;  Surgeon: Rod Can, MD;  Location: [facility];  Service: Orthopedics;  Laterality: Left;  . ORIF ANKLE FRACTURE Right 01/06/2017   Procedure: OPEN REDUCTION INTERNAL FIXATION (ORIF) ANKLE FRACTURE;  Surgeon: Wylene Simmer, MD;  Location: [facility];  Service: Orthopedics;  Laterality: Right;  . ROTATOR CUFF REPAIR  2021  . TONSILLECTOMY  1968    Family history:  Family History  Problem Relation Age of Onset  . Hypertension Mother   . Hyperlipidemia Mother   . Arthritis Father   . Asthma Son     Social history: Lives in a mobile home with carpeting in the bedroom with electric heating and window, fan cooling.  She has dog and chickens outside but she does keep the baby chicks inside.  She is on disability.  She has a smoking history since 1976 of a pack each day but currently in process of quitting as in HPI  Medication  List: Current Outpatient Medications  Medication Sig Dispense Refill  . albuterol (VENTOLIN HFA) 108 (90 Base) MCG/ACT inhaler INHALE TWO PUFFS EVERY 6 HOURS AS NEEDED    . alendronate (FOSAMAX) 70 MG tablet take 1 tablet by mouth once weekly on empty stomach with 8 ounces of water. must sit upright for 30 minutes    . Ascorbic Acid (VITAMIN C PO) Take by mouth.    Marland Kitchen azelastine (ASTELIN) 0.1 % nasal spray SMARTSIG:1-2 Spray(s) Both Nares Twice Daily    . B Complex  Vitamins (VITAMIN B COMPLEX PO) Take by mouth.    Marland Kitchen BREO ELLIPTA 200-25 MCG/INH AEPB 1 puff every morning.    . Cetirizine HCl (ZYRTEC PO) Take by mouth as needed.    . CHANTIX 1 MG tablet Take 1 mg by mouth 2 (two) times daily.    . COMBIVENT RESPIMAT 20-100 MCG/ACT AERS respimat USE ONE INHALATION FOUR TIMES DAILY AS NEEDED    . diclofenac Sodium (VOLTAREN) 1 % GEL Apply 2 g topically 4 (four) times daily.    . diphenhydrAMINE HCl (BENADRYL PO) Take by mouth at bedtime.    Marland Kitchen Fexofenadine HCl (ALLEGRA PO) Take by mouth as needed.    . fluticasone (FLONASE) 50 MCG/ACT nasal spray Place 2 sprays into both nostrils 2 (two) times daily.    . furosemide (LASIX) 40 MG tablet Take 40 mg by mouth as needed.    . gabapentin (NEURONTIN) 300 MG capsule Take 600 mg by mouth 4 (four) times daily.    . hydrochlorothiazide (HYDRODIURIL) 25 MG tablet Take 12.5 mg by mouth every morning.    Marland Kitchen HYDROcodone-acetaminophen (NORCO/VICODIN) 5-325 MG tablet TAKE ONE TABLET BY MOUTH ONCE DAILY AS NEEDED    . ibuprofen (ADVIL) 800 MG tablet Take 800 mg by mouth every 8 (eight) hours as needed.    Marland Kitchen ketoconazole (NIZORAL) 2 % shampoo     . lisinopril (PRINIVIL,ZESTRIL) 10 MG tablet Take 10 mg by mouth daily.    . montelukast (SINGULAIR) 10 MG tablet Take 10 mg by mouth daily.    Marland Kitchen tiZANidine (ZANAFLEX) 4 MG tablet Take 4 mg by mouth 2 (two) times daily.    Marland Kitchen VITAMIN D PO Take by mouth.    . Fluticasone-Umeclidin-Vilant (TRELEGY ELLIPTA) 200-62.5-25 MCG/INH  AEPB Inhale one dose once daily to prevent cough or wheeze.  Rinse, gargle, and spit after use. 60 each 5   No current facility-administered medications for this visit.    Known medication allergies: Allergies  Allergen Reactions  . Penicillins Anaphylaxis and Other (See Comments)    Has patient had a PCN reaction causing immediate rash, facial/tongue/throat swelling, SOB or lightheadedness with hypotension: Yes Has patient had a PCN reaction causing severe rash involving mucus membranes or skin necrosis: No Has patient had a PCN reaction that required hospitalization: No Has patient had a PCN reaction occurring within the last 10 years: No If all of the above answers are "NO", then may proceed with Cephalosporin use.  . Prozac [Fluoxetine Hcl] Hives, Swelling and Other (See Comments)    Facial swelling     Physical examination: Blood pressure 124/62, pulse 60, resp. rate 16, height 5' 4.8" (1.646 m), weight 215 lb 12.8 oz (97.9 kg), SpO2 95 %.  General: Alert, interactive, in no acute distress. HEENT: PERRLA, TMs pearly gray, turbinates mildly edematous with clear discharge, post-pharynx non erythematous. Neck: Supple without lymphadenopathy. Lungs: Clear to auscultation without wheezing, rhonchi or rales. {no increased work of breathing. CV: Normal S1, S2 without murmurs. Abdomen: Nondistended, nontender. Skin: Warm and dry, without lesions or rashes. Extremities:  No clubbing, cyanosis or edema. Neuro:   Grossly intact.  Diagnositics/Labs: Spirometry: FEV1: 1.57L 61%, FVC: 1.91L 57% predicted.  She did not have any improvement s/p bronchodilator.  Allergy testing: environmental allergy skin prick testing is positive to ky blue, meadow fescue, perrenial rye, sweet vernal, timothy.   Intradermal testing is positive to cockroach and mite mix.  Skin prick to milk is negative.  Allergy testing results were read and interpreted by  provider, documented by clinical  staff.   Assessment and plan:   Allergic rhinitis   - environmental allergy skin testing is positive to grass pollens, dust mite and cockroach.    - allergen avoidance measures discussed/handouts provided   - try Xyzal 5mg  daily   - continue Singulair daily   - recommend performing nasal saline rinses to help clean/flush out the nose and sinus tract.  This also helps your nasal sprays work better if nose is cleaning.    - for nasal drainage use Astelin/Azelastine 2 sprays each nostril twice a day   - use Flonase 2 sprays each nostril if having nasal congestion.  Use for 1-2 weeks at a time for nasal congestion.     - allergen immunotherapy discussed today including protocol, benefits and risk.  Informational handout provided.  If interested in this therapuetic option you can check with your insurance carrier for coverage.  Let us know if you would like to proceed with this option.    COPD with asthma  - at this time not under good control.    - Lung function is low and does not improve with albuterol  - stop Breo  - Start Trelegy 252mcg 1 puff daily  - continue Singulair 10mg  daily at bedtime  - have access to albuterol inhaler 2 puffs every 4-6 hours as needed for cough/wheeze/shortness of breath/chest tightness.  May use 15-20 minutes prior to activity.   Monitor frequency of use.    Control goals:   Full participation in all desired activities (may need albuterol before activity)  Albuterol use two time or less a week on average (not counting use with activity)  Cough interfering with sleep two time or less a month  Oral steroids no more than once a year  No hospitalizations  Tobacco use  - continue your course to quitting!  Continue to decrease your daily cigarette use.  Continue on Chantix.   Parosmia  - unsure if this is related to past Covid illness or not.  It is quite possible that this odor she is smelling is due to drug (meth lab) in neighboring home - she has  already alerted authorities into this matter  - can refer to ENT if you would like further evaluation into this  - as above would recommend performing nasal saline rinse  Food intolerance  - skin testing to milk is negative thus confirming dairy intolerance  - continue avoidance of milk/ice cream to prevent GI symptoms  Follow-up 3-4 months or sooner if needed  I appreciate the opportunity to take part in Diana Oneill care. Please do not hesitate to contact me with questions.  Sincerely,   Prudy Feeler, MD Allergy/Immunology [facility]

## 2019-09-23 NOTE — Patient Instructions (Addendum)
Allergies   - environmental allergy skin testing is positive to grass pollens, dust mite and cockroach.    - allergen avoidance measures discussed/handouts provided   - try Xyzal 5mg  daily   - continue Singulair daily   - recommend performing nasal saline rinses to help clean/flush out the nose and sinus tract.  This also helps your nasal sprays work better if nose is cleaning.    - for nasal drainage use Astelin/Azelastine 2 sprays each nostril twice a day   - use Flonase 2 sprays each nostril if having nasal congestion.  Use for 1-2 weeks at a time for nasal congestion.     - allergen immunotherapy discussed today including protocol, benefits and risk.  Informational handout provided.  If interested in this therapuetic option you can check with your insurance carrier for coverage.  Let us know if you would like to proceed with this option.    COPD with asthma  - at this time not under good control.    - Lung function is low and does not improve with albuterol  - stop Breo  - Start Trelegy 282mcg 1 puff daily  - continue Singulair 10mg  daily at bedtime  - have access to albuterol inhaler 2 puffs every 4-6 hours as needed for cough/wheeze/shortness of breath/chest tightness.  May use 15-20 minutes prior to activity.   Monitor frequency of use.    Control goals:   Full participation in all desired activities (may need albuterol before activity)  Albuterol use two time or less a week on average (not counting use with activity)  Cough interfering with sleep two time or less a month  Oral steroids no more than once a year  No hospitalizations  Tobacco use  - continue your course to quitting!  Continue to decrease your daily cigarette use.  Continue on Chantix.   Strange odor  - unsure if this is related to past Covid illness or not  - can refer to ENT if you would like further evaluation into this  - as above would recommend performing nasal saline rinse  Food intolerance  - skin  testing to milk is negative thus confirming dairy intolerance  - continue avoidance of milk/ice cream to prevent GI symptoms  Follow-up 3-4 months or sooner if needed

## 2019-10-02 ENCOUNTER — Ambulatory Visit: Payer: Medicare Other | Admitting: Sports Medicine

## 2019-10-20 DIAGNOSIS — R001 Bradycardia, unspecified: Secondary | ICD-10-CM | POA: Insufficient documentation

## 2019-10-23 ENCOUNTER — Ambulatory Visit: Payer: Medicare Other | Admitting: Sports Medicine

## 2019-10-27 ENCOUNTER — Ambulatory Visit: Payer: Self-pay | Admitting: Orthopedic Surgery

## 2019-10-30 ENCOUNTER — Encounter: Payer: Self-pay | Admitting: Allergy

## 2019-10-31 ENCOUNTER — Other Ambulatory Visit (HOSPITAL_COMMUNITY): Payer: Medicare Other

## 2019-10-31 ENCOUNTER — Ambulatory Visit: Payer: Self-pay | Admitting: Orthopedic Surgery

## 2019-10-31 ENCOUNTER — Encounter (HOSPITAL_COMMUNITY): Payer: Self-pay

## 2019-10-31 NOTE — Progress Notes (Signed)
COVID Vaccine Completed: Date COVID Vaccine completed: COVID vaccine manufacturer: [place]   PCP - Adela Glimpse FNP last office visit and surgical clearance note 10/20/2019 in care everywhere Cardiologist - Dr. Orlinda Blalock 10/23/19 cardiac pre op exam in care everywhere  Chest x-ray - greater than 1 year EKG - 10/23/2019 in care everywhere Stress Test -  ECHO -  Cardiac Cath -   Sleep Study -  CPAP -   Fasting Blood Sugar -  Checks Blood Sugar _____ times a day Hgb A1c 10/20/2019 5.4 in care everywhere  Blood Thinner Instructions: Aspirin Instructions: Last Dose:  Anesthesia review: COPD, OSA, thoracic aneurysm  Patient denies shortness of breath, fever, cough and chest pain at PAT appointment   Patient verbalized understanding of instructions that were given to them at the PAT appointment. Patient was also instructed that they will need to review over the PAT instructions again at home before surgery.

## 2019-10-31 NOTE — Patient Instructions (Addendum)
DUE TO COVID-19 ONLY ONE VISITOR ARE ALLOWED TO COME WITH YOU AND STAY IN THE WAITING ROOM ONLY DURING PRE OP AND PROCEDURE. THEN TWO VISITORS MAY VISIT WITH YOU IN YOUR PRIVATE ROOM DURING VISITING HOURS ONLY!!   COVID SWAB TESTING MUST BE COMPLETED ON: Today, November 04, 2019 immediately after pre op appointment     [address], [place] -Former [facility] enter pre surgical testing line (Must self quarantine after testing. Follow instructions on handout.)             Your procedure is scheduled on: Friday, November 07, 2019   Report to [facility] Main  Entrance    Report to admitting at 8:00 AM   Call this number if you have problems the morning of surgery [phone]   Do not eat food:After Midnight.   May have liquids until  7:30 AM  day of surgery   CLEAR LIQUID DIET  Foods Allowed                                                                     Foods Excluded  Water, Black Coffee and tea, regular and decaf                             liquids that you cannot  Plain Jell-O in any flavor  (No red)                                           see through such as: Fruit ices (not with fruit pulp)                                     milk, soups, orange juice  Iced Popsicles (No red)                                    All solid food                                   Apple juices Sports drinks like Gatorade (No red) Lightly seasoned clear broth or consume(fat free) Sugar, honey syrup    Complete one Ensure drink the morning of surgery at 7:30 AM the day of surgery.   Oral Hygiene is also important to reduce your risk of infection.                                    Remember - BRUSH YOUR TEETH THE MORNING OF SURGERY WITH YOUR REGULAR TOOTHPASTE   Do NOT smoke after Midnight   Take these medicines the morning of surgery with A SIP OF WATER: Gabapentin, Hydrocodone, Montelukast, Xyzal if needed   Use Inhaler the morning of surgery per normal routine   May  use nasal spray if needed  Bring rescue inhaler day of surgery                               You may not have any metal on your body including hair pins, jewelry, and body piercings             Do not wear make-up, lotions, powders, perfumes/cologne, or deodorant             Do not wear nail polish.  Do not shave  48 hours prior to surgery.                Do not bring valuables to the hospital. [place].   Contacts, dentures or bridgework may not be worn into surgery.   Bring small overnight bag day of surgery.   Special Instructions: Bring a copy of your healthcare power of attorney and living will documents the day of surgery if you haven't scanned them in before.              Please read over the following fact sheets you were given:             IF YOU HAVE QUESTIONS ABOUT YOUR PRE OP INSTRUCTIONS PLEASE CALL [phone]    - Preparing for Surgery Before surgery, you can play an important role.  Because skin is not sterile, your skin needs to be as free of germs as possible.  You can reduce the number of germs on your skin by washing with CHG (chlorahexidine gluconate) soap before surgery.  CHG is an antiseptic cleaner which kills germs and bonds with the skin to continue killing germs even after washing. Please DO NOT use if you have an allergy to CHG or antibacterial soaps.  If your skin becomes reddened/irritated stop using the CHG and inform your nurse when you arrive at Short Stay. Do not shave (including legs and underarms) for at least 48 hours prior to the first CHG shower.  You may shave your face/neck.  Please follow these instructions carefully:  1.  Shower with CHG Soap the night before surgery and the  morning of surgery.  2.  If you choose to wash your hair, wash your hair first as usual with your normal  shampoo.  3.  After you shampoo, rinse your hair and body thoroughly to remove the shampoo.                              4.  Use CHG as you would any other liquid soap.  You can apply chg directly to the skin and wash.                   Gently with a scrungie or clean washcloth.  5.  Apply the CHG Soap to your body ONLY FROM THE NECK DOWN.   Do not use on face/ open                           Wound or open sores. Avoid contact with eyes, ears mouth and genitals (private parts).                       Wash face,  Genitals (private parts) with your  normal soap.             6.  Wash thoroughly, paying special attention to the area where your surgery  will be performed.  7.  Thoroughly rinse your body with warm water from the neck down.  8.  DO NOT shower/wash with your normal soap after using and rinsing off the CHG Soap.             9.  Pat yourself dry with a clean towel.            10.  Wear clean pajamas.            11.  Place clean sheets on your bed the night of your first shower and do not  sleep with pets. Day of Surgery : Do not apply any lotions/deodorants the morning of surgery.  Please wear clean clothes to the hospital/surgery center.  FAILURE TO FOLLOW THESE INSTRUCTIONS MAY RESULT IN THE CANCELLATION OF YOUR SURGERY  PATIENT SIGNATURE_________________________________  NURSE SIGNATURE__________________________________  ________________________________________________________________________   Adam Phenix  An incentive spirometer is a tool that can help keep your lungs clear and active. This tool measures how well you are filling your lungs with each breath. Taking long deep breaths may help reverse or decrease the chance of developing breathing (pulmonary) problems (especially infection) following:  A long period of time when you are unable to move or be active. BEFORE THE PROCEDURE   If the spirometer includes an indicator to show your best effort, your nurse or respiratory therapist will set it to a desired goal.  If possible, sit up straight or lean slightly forward.  Try not to slouch.  Hold the incentive spirometer in an upright position. INSTRUCTIONS FOR USE  1. Sit on the edge of your bed if possible, or sit up as far as you can in bed or on a chair. 2. Hold the incentive spirometer in an upright position. 3. Breathe out normally. 4. Place the mouthpiece in your mouth and seal your lips tightly around it. 5. Breathe in slowly and as deeply as possible, raising the piston or the ball toward the top of the column. 6. Hold your breath for 3-5 seconds or for as long as possible. Allow the piston or ball to fall to the bottom of the column. 7. Remove the mouthpiece from your mouth and breathe out normally. 8. Rest for a few seconds and repeat Steps 1 through 7 at least 10 times every 1-2 hours when you are awake. Take your time and take a few normal breaths between deep breaths. 9. The spirometer may include an indicator to show your best effort. Use the indicator as a goal to work toward during each repetition. 10. After each set of 10 deep breaths, practice coughing to be sure your lungs are clear. If you have an incision (the cut made at the time of surgery), support your incision when coughing by placing a pillow or rolled up towels firmly against it. Once you are able to get out of bed, walk around indoors and cough well. You may stop using the incentive spirometer when instructed by your caregiver.  RISKS AND COMPLICATIONS  Take your time so you do not get dizzy or light-headed.  If you are in pain, you may need to take or ask for pain medication before doing incentive spirometry. It is harder to take a deep breath if you are having pain. AFTER USE  Rest and breathe slowly and easily.  It  can be helpful to keep track of a log of your progress. Your caregiver can provide you with a simple table to help with this. If you are using the spirometer at home, follow these instructions: [place] IF:   You are having difficultly using the  spirometer.  You have trouble using the spirometer as often as instructed.  Your pain medication is not giving enough relief while using the spirometer.  You develop fever of 100.5 F (38.1 C) or higher. SEEK IMMEDIATE MEDICAL CARE IF:   You cough up bloody sputum that had not been present before.  You develop fever of 102 F (38.9 C) or greater.  You develop worsening pain at or near the incision site. MAKE SURE YOU:   Understand these instructions.  Will watch your condition.  Will get help right away if you are not doing well or get worse. Document Released: 08/28/2006 Document Revised: 07/10/2011 Document Reviewed: 10/29/2006 ExitCare Patient Information 2014 Exit[employer], [place].   ________________________________________________________________________  WHAT IS A BLOOD TRANSFUSION? Blood Transfusion Information  A transfusion is the replacement of blood or some of its parts. Blood is made up of multiple cells which provide different functions.  Red blood cells carry oxygen and are used for blood loss replacement.  White blood cells fight against infection.  Platelets control bleeding.  Plasma helps clot blood.  Other blood products are available for specialized needs, such as hemophilia or other clotting disorders. BEFORE THE TRANSFUSION  Who gives blood for transfusions?   Healthy volunteers who are fully evaluated to make sure their blood is safe. This is blood bank blood. Transfusion therapy is the safest it has ever been in the practice of medicine. Before blood is taken from a donor, a complete history is taken to make sure that person has no history of diseases nor engages in risky social behavior (examples are intravenous drug use or sexual activity with multiple partners). The donor's travel history is screened to minimize risk of transmitting infections, such as malaria. The donated blood is tested for signs of infectious diseases, such as HIV and hepatitis.  The blood is then tested to be sure it is compatible with you in order to minimize the chance of a transfusion reaction. If you or a relative donates blood, this is often done in anticipation of surgery and is not appropriate for emergency situations. It takes many days to process the donated blood. RISKS AND COMPLICATIONS Although transfusion therapy is very safe and saves many lives, the main dangers of transfusion include:   Getting an infectious disease.  Developing a transfusion reaction. This is an allergic reaction to something in the blood you were given. Every precaution is taken to prevent this. The decision to have a blood transfusion has been considered carefully by your caregiver before blood is given. Blood is not given unless the benefits outweigh the risks. AFTER THE TRANSFUSION  Right after receiving a blood transfusion, you will usually feel much better and more energetic. This is especially true if your red blood cells have gotten low (anemic). The transfusion raises the level of the red blood cells which carry oxygen, and this usually causes an energy increase.  The nurse administering the transfusion will monitor you carefully for complications. HOME CARE INSTRUCTIONS  No special instructions are needed after a transfusion. You may find your energy is better. Speak with your caregiver about any limitations on activity for underlying diseases you may have. SEEK MEDICAL CARE IF:   Your condition  is not improving after your transfusion.  You develop redness or irritation at the intravenous (IV) site. SEEK IMMEDIATE MEDICAL CARE IF:  Any of the following symptoms occur over the next 12 hours:  Shaking chills.  You have a temperature by mouth above 102 F (38.9 C), not controlled by medicine.  Chest, back, or muscle pain.  People around you feel you are not acting correctly or are confused.  Shortness of breath or difficulty breathing.  Dizziness and fainting.  You  get a rash or develop hives.  You have a decrease in urine output.  Your urine turns a dark color or changes to pink, red, or brown. Any of the following symptoms occur over the next 10 days:  You have a temperature by mouth above 102 F (38.9 C), not controlled by medicine.  Shortness of breath.  Weakness after normal activity.  The white part of the eye turns yellow (jaundice).  You have a decrease in the amount of urine or are urinating less often.  Your urine turns a dark color or changes to pink, red, or brown. Document Released: 04/14/2000 Document Revised: 07/10/2011 Document Reviewed: 12/02/2007 [facility] Patient Information 2014 [place], [place].  _______________________________________________________________________

## 2019-10-31 NOTE — H&P (Signed)
TOTAL KNEE REVISION ADMISSION H&P  Patient is being admitted for left revision total knee arthroplasty.  Subjective:  Chief Complaint:left knee pain.  HPI: Diana Oneill, 64 y.o. female, has a history of pain and functional disability in the left knee(s) due to failed previous arthroplasty and patient has failed non-surgical conservative treatments for greater than 12 weeks to include NSAID's and/or analgesics, weight reduction as appropriate and activity modification. The indications for the revision of the total knee arthroplasty are loosening of one or more components. Onset of symptoms was gradual starting 2 years ago with gradually worsening course since that time.  Prior procedures on the left knee(s) include arthroplasty.  Patient currently rates pain in the left knee(s) at 8 out of 10 with activity. There is worsening of pain with activity and weight bearing, pain that interferes with activities of daily living and pain with passive range of motion.  Patient has evidence of prosthetic loosening by imaging studies. This condition presents safety issues increasing the risk of falls. There is no current active infection.  Patient Active Problem List   Diagnosis Date Noted   Morbid (severe) obesity with alveolar hypoventilation ([place]) 09/12/2019   Coronavirus infection 04/21/2019   Preoperative cardiovascular examination 04/18/2019   Overweight 04/18/2019   Cigarette smoker 04/18/2019   Chronic knee pain after total replacement of left knee joint 03/05/2019   Ankle pain 07/10/2017   Closed displaced trimalleolar fracture of right ankle 01/05/2017   Acute encephalopathy 10/25/2016   Essential hypertension 10/25/2016   Obesity 10/25/2016   Acidosis 10/25/2016   Nausea without vomiting 10/25/2016   Tachycardia 10/25/2016   COPD (chronic obstructive pulmonary disease) ([place]) 10/25/2016   Acute respiratory failure ([place]) 10/25/2016   Hyponatremia    Acute kidney  injury ([place])    Anemia    Thrombocytopenia (H[facility])    Osteoarthritis of left knee 10/23/2016   Bipolar disorder, now depressed ([place]) 08/10/2014   Cocaine use disorder, mild, in early remission ([place]) 08/10/2014   Past Medical History:  Diagnosis Date   Acidosis 10/25/2016   Acute encephalopathy 10/25/2016   Acute kidney injury ([place])    Acute respiratory failure ([place]) 10/25/2016   Anemia    Ankle pain 07/10/2017   Anxiety    Arthritis    Asthma    Bipolar disorder, now depressed ([place]) 08/10/2014   Cancer (HCC)    skin cancer, uterine cancer   CKD (chronic kidney disease), stage III    Closed displaced trimalleolar fracture of right ankle 01/05/2017   Cocaine use disorder, mild, in early remission ([place]) 08/10/2014   COPD (chronic obstructive pulmonary disease) ([place])    Depression    Dyspnea    with exertion   Essential hypertension 10/25/2016   History of kidney stones    Hypertension    Hyponatremia    Nausea without vomiting 10/25/2016   Obese    Obesity 10/25/2016   Osteoarthritis    Osteoarthritis of left knee 10/23/2016   Sleep apnea    does not use CPAP "Makes too much Noise"   Tachycardia 10/25/2016   Thoracic aortic aneurysm ([place]) 09/2016   4.1 cm ascending thoracic aortic aneurysm.   Thrombocytopenia ([place]) 09/2016    Past Surgical History:  Procedure Laterality Date   ABDOMINAL HYSTERECTOMY  1988   ACHILLES TENDON REPAIR Left    CARPAL TUNNEL RELEASE Bilateral 2007   CATARACT EXTRACTION, BILATERAL  2020   GASTRIC BYPASS     JOINT REPLACEMENT Right    knee replace  KNEE ARTHROPLASTY Left 10/23/2016   Procedure: COMPUTER ASSISTED TOTAL KNEE ARTHROPLASTY;  Surgeon: Rod Can, MD;  Location: [facility];  Service: Orthopedics;  Laterality: Left;   ORIF ANKLE FRACTURE Right 01/06/2017   Procedure: OPEN REDUCTION INTERNAL FIXATION (ORIF) ANKLE FRACTURE;  Surgeon: Wylene Simmer, MD;  Location: [facility];  Service: Orthopedics;  Laterality:  Right;   ROTATOR CUFF REPAIR  2021   TONSILLECTOMY  1968    Current Outpatient Medications  Medication Sig Dispense Refill Last Dose   albuterol (VENTOLIN HFA) 108 (90 Base) MCG/ACT inhaler Inhale 2 puffs into the lungs every 6 (six) hours as needed for wheezing or shortness of breath.       alendronate (FOSAMAX) 70 MG tablet Take 70 mg by mouth every Sunday.       Ascorbic Acid (VITAMIN C PO) Take 1,000 mg by mouth daily.       azelastine (ASTELIN) 0.1 % nasal spray Place 2 sprays into both nostrils 2 (two) times daily as needed for rhinitis or allergies.       B Complex Vitamins (VITAMIN B COMPLEX PO) Take 1 tablet by mouth daily.       CHANTIX 1 MG tablet Take 1 mg by mouth 2 (two) times daily.      Cholecalciferol (VITAMIN D3) 125 MCG (5000 UT) CAPS Take 5,000 Units by mouth daily.      COMBIVENT RESPIMAT 20-100 MCG/ACT AERS respimat Inhale 1 puff into the lungs every 6 (six) hours as needed for wheezing or shortness of breath.       ferrous sulfate 325 (65 FE) MG EC tablet Take 325 mg by mouth 3 (three) times daily with meals.      fluticasone (FLONASE) 50 MCG/ACT nasal spray Place 2 sprays into both nostrils 2 (two) times daily as needed for allergies.       Fluticasone-Umeclidin-Vilant (TRELEGY ELLIPTA) 200-62.5-25 MCG/INH AEPB Inhale one dose once daily to prevent cough or wheeze.  Rinse, gargle, and spit after use. (Patient taking differently: Inhale 1 puff into the lungs daily. Inhale one dose once daily to prevent cough or wheeze.  Rinse, gargle, and spit after use.) 60 each 5    furosemide (LASIX) 40 MG tablet Take 40 mg by mouth daily.       gabapentin (NEURONTIN) 300 MG capsule Take 600 mg by mouth 4 (four) times daily.      HYDROcodone-acetaminophen (NORCO/VICODIN) 5-325 MG tablet Take 1 tablet by mouth in the morning, at noon, and at bedtime.       ketoconazole (NIZORAL) 2 % shampoo Apply 1 application topically 2 (two) times a week.       levocetirizine (XYZAL)  5 MG tablet Take 5 mg by mouth daily as needed for allergies.      lisinopril-hydrochlorothiazide (ZESTORETIC) 10-12.5 MG tablet Take 1 tablet by mouth daily.      montelukast (SINGULAIR) 10 MG tablet Take 10 mg by mouth daily.      nystatin (MYCOSTATIN/NYSTOP) powder Apply 1 application topically daily.      tiZANidine (ZANAFLEX) 4 MG tablet Take 4 mg by mouth at bedtime.       triamcinolone cream (KENALOG) 0.1 % Apply 1 application topically 2 (two) times daily as needed (itching).      No current facility-administered medications for this visit.   Allergies  Allergen Reactions   Penicillins Anaphylaxis and Other (See Comments)    Has patient had a PCN reaction causing immediate rash, facial/tongue/throat swelling, SOB or lightheadedness with hypotension: Yes  Has patient had a PCN reaction causing severe rash involving mucus membranes or skin necrosis: No Has patient had a PCN reaction that required hospitalization: No Has patient had a PCN reaction occurring within the last 10 years: No If all of the above answers are "NO", then may proceed with Cephalosporin use.   Prozac [Fluoxetine Hcl] Hives, Swelling and Other (See Comments)    Facial swelling    Social History   Tobacco Use   Smoking status: Current Every Day Smoker    Years: 45.00    Types: Cigarettes   Smokeless tobacco: Never Used   Tobacco comment: Currently smoking about 2-3 cigs per day- taking Chantix   Substance Use Topics   Alcohol use: Not Currently    Family History  Problem Relation Age of Onset   Hypertension Mother    Hyperlipidemia Mother    Arthritis Father    Asthma Son       Review of Systems  Constitutional: Negative.   HENT: Negative.   Respiratory:       Occasional shortness of breath on exertion and wheezing  Cardiovascular: Negative.   Gastrointestinal: Negative.   Genitourinary: Negative.   Musculoskeletal: Positive for arthralgias.  Skin: Negative.    Allergic/Immunologic: Positive for environmental allergies (Pollen).  Neurological: Negative.   Psychiatric/Behavioral: Negative.      Objective:  Physical Exam Constitutional:      Appearance: Normal appearance.  HENT:     Head: Normocephalic and atraumatic.  Eyes:     Extraocular Movements: Extraocular movements intact.     Pupils: Pupils are equal, round, and reactive to light.  Cardiovascular:     Rate and Rhythm: Normal rate and regular rhythm.     Pulses: Normal pulses.  Pulmonary:     Effort: Pulmonary effort is normal.     Breath sounds: Normal breath sounds.  Abdominal:     Palpations: Abdomen is soft.     Tenderness: There is no abdominal tenderness.  Genitourinary:    Comments: Deferred Musculoskeletal:     Cervical back: Normal range of motion and neck supple.     Comments: Left Knee:  Moderate  tenderness to palpation about the  lateral  joint line of the left knee. Tenderness along the tibial tray. AROM  0-90  degrees.  No  effusion noted.  Skin:    General: Skin is warm and dry.  Neurological:     General: No focal deficit present.     Mental Status: She is alert and oriented to person, place, and time.  Psychiatric:        Mood and Affect: Mood normal.     Vital signs in last 24 hours: @VSRANGES @  Labs:  Estimated body mass index is 36.13 kg/m as calculated from the following:   Height as of 09/23/19: 5' 4.8" (1.646 m).   Weight as of 09/23/19: 97.9 kg.  Imaging Review . The overall alignment is neutral.There is evidence of loosening of the tibial components. The bone quality appears to be adequate for age and reported activity level.     Assessment/Plan:  End stage arthritis, left knee(s) with failed previous arthroplasty.   The patient history, physical examination, clinical judgment of the provider and imaging studies are consistent with end stage degenerative joint disease of the left knee(s), previous total knee arthroplasty.  Revision total knee arthroplasty is deemed medically necessary. The treatment options including medical management, injection therapy, arthroscopy and revision arthroplasty were discussed at length. The risks and benefits of  revision total knee arthroplasty were presented and reviewed. The risks due to aseptic loosening, infection, stiffness, patella tracking problems, thromboembolic complications and other imponderables were discussed. The patient acknowledged the explanation, agreed to proceed with the plan and consent was signed. Patient is being admitted for inpatient treatment for surgery, pain control, PT, OT, prophylactic antibiotics, VTE prophylaxis, progressive ambulation and ADL's and discharge planning.The patient is planning to be discharged to skilled nursing facility

## 2019-10-31 NOTE — H&P (View-Only) (Signed)
TOTAL KNEE REVISION ADMISSION H&P  Patient is being admitted for left revision total knee arthroplasty.  Subjective:  Chief Complaint:left knee pain.  HPI: Diana Oneill, 64 y.o. female, has a history of pain and functional disability in the left knee(s) due to failed previous arthroplasty and patient has failed non-surgical conservative treatments for greater than 12 weeks to include NSAID's and/or analgesics, weight reduction as appropriate and activity modification. The indications for the revision of the total knee arthroplasty are loosening of one or more components. Onset of symptoms was gradual starting 2 years ago with gradually worsening course since that time.  Prior procedures on the left knee(s) include arthroplasty.  Patient currently rates pain in the left knee(s) at 8 out of 10 with activity. There is worsening of pain with activity and weight bearing, pain that interferes with activities of daily living and pain with passive range of motion.  Patient has evidence of prosthetic loosening by imaging studies. This condition presents safety issues increasing the risk of falls. There is no current active infection.  Patient Active Problem List   Diagnosis Date Noted  . Morbid (severe) obesity with alveolar hypoventilation ([place]) 09/12/2019  . Coronavirus infection 04/21/2019  . Preoperative cardiovascular examination 04/18/2019  . Overweight 04/18/2019  . Cigarette smoker 04/18/2019  . Chronic knee pain after total replacement of left knee joint 03/05/2019  . Ankle pain 07/10/2017  . Closed displaced trimalleolar fracture of right ankle 01/05/2017  . Acute encephalopathy 10/25/2016  . Essential hypertension 10/25/2016  . Obesity 10/25/2016  . Acidosis 10/25/2016  . Nausea without vomiting 10/25/2016  . Tachycardia 10/25/2016  . COPD (chronic obstructive pulmonary disease) ([place]) 10/25/2016  . Acute respiratory failure ([place]) 10/25/2016  . Hyponatremia   . Acute kidney  injury ([place])   . Anemia   . Thrombocytopenia ([place])   . Osteoarthritis of left knee 10/23/2016  . Bipolar disorder, now depressed ([place]) 08/10/2014  . Cocaine use disorder, mild, in early remission ([place]) 08/10/2014   Past Medical History:  Diagnosis Date  . Acidosis 10/25/2016  . Acute encephalopathy 10/25/2016  . Acute kidney injury ([place])   . Acute respiratory failure ([place]) 10/25/2016  . Anemia   . Ankle pain 07/10/2017  . Anxiety   . Arthritis   . Asthma   . Bipolar disorder, now depressed ([place]) 08/10/2014  . Cancer ([place])    skin cancer, uterine cancer  . CKD (chronic kidney disease), stage III   . Closed displaced trimalleolar fracture of right ankle 01/05/2017  . Cocaine use disorder, mild, in early remission ([place]) 08/10/2014  . COPD (chronic obstructive pulmonary disease) ([place])   . Depression   . Dyspnea    with exertion  . Essential hypertension 10/25/2016  . History of kidney stones   . Hypertension   . Hyponatremia   . Nausea without vomiting 10/25/2016  . Obese   . Obesity 10/25/2016  . Osteoarthritis   . Osteoarthritis of left knee 10/23/2016  . Sleep apnea    does not use CPAP "Makes too much Noise"  . Tachycardia 10/25/2016  . Thoracic aortic aneurysm ([place]) 09/2016   4.1 cm ascending thoracic aortic aneurysm.  . Thrombocytopenia ([place]) 09/2016    Past Surgical History:  Procedure Laterality Date  . ABDOMINAL HYSTERECTOMY  1988  . ACHILLES TENDON REPAIR Left   . CARPAL TUNNEL RELEASE Bilateral 2007  . CATARACT EXTRACTION, BILATERAL  2020  . GASTRIC BYPASS    . JOINT REPLACEMENT Right    knee replace  .  KNEE ARTHROPLASTY Left 10/23/2016   Procedure: COMPUTER ASSISTED TOTAL KNEE ARTHROPLASTY;  Surgeon: Rod Can, MD;  Location: [facility];  Service: Orthopedics;  Laterality: Left;  . ORIF ANKLE FRACTURE Right 01/06/2017   Procedure: OPEN REDUCTION INTERNAL FIXATION (ORIF) ANKLE FRACTURE;  Surgeon: Wylene Simmer, MD;  Location: [facility];  Service: Orthopedics;  Laterality:  Right;  . ROTATOR CUFF REPAIR  2021  . TONSILLECTOMY  1968    Current Outpatient Medications  Medication Sig Dispense Refill Last Dose  . albuterol (VENTOLIN HFA) 108 (90 Base) MCG/ACT inhaler Inhale 2 puffs into the lungs every 6 (six) hours as needed for wheezing or shortness of breath.      Marland Kitchen alendronate (FOSAMAX) 70 MG tablet Take 70 mg by mouth every Sunday.      . Ascorbic Acid (VITAMIN C PO) Take 1,000 mg by mouth daily.      Marland Kitchen azelastine (ASTELIN) 0.1 % nasal spray Place 2 sprays into both nostrils 2 (two) times daily as needed for rhinitis or allergies.      . B Complex Vitamins (VITAMIN B COMPLEX PO) Take 1 tablet by mouth daily.      . CHANTIX 1 MG tablet Take 1 mg by mouth 2 (two) times daily.     . Cholecalciferol (VITAMIN D3) 125 MCG (5000 UT) CAPS Take 5,000 Units by mouth daily.     . COMBIVENT RESPIMAT 20-100 MCG/ACT AERS respimat Inhale 1 puff into the lungs every 6 (six) hours as needed for wheezing or shortness of breath.      . ferrous sulfate 325 (65 FE) MG EC tablet Take 325 mg by mouth 3 (three) times daily with meals.     . fluticasone (FLONASE) 50 MCG/ACT nasal spray Place 2 sprays into both nostrils 2 (two) times daily as needed for allergies.      . Fluticasone-Umeclidin-Vilant (TRELEGY ELLIPTA) 200-62.5-25 MCG/INH AEPB Inhale one dose once daily to prevent cough or wheeze.  Rinse, gargle, and spit after use. (Patient taking differently: Inhale 1 puff into the lungs daily. Inhale one dose once daily to prevent cough or wheeze.  Rinse, gargle, and spit after use.) 60 each 5   . furosemide (LASIX) 40 MG tablet Take 40 mg by mouth daily.      Marland Kitchen gabapentin (NEURONTIN) 300 MG capsule Take 600 mg by mouth 4 (four) times daily.     Marland Kitchen HYDROcodone-acetaminophen (NORCO/VICODIN) 5-325 MG tablet Take 1 tablet by mouth in the morning, at noon, and at bedtime.      Marland Kitchen ketoconazole (NIZORAL) 2 % shampoo Apply 1 application topically 2 (two) times a week.      . levocetirizine (XYZAL)  5 MG tablet Take 5 mg by mouth daily as needed for allergies.     Marland Kitchen lisinopril-hydrochlorothiazide (ZESTORETIC) 10-12.5 MG tablet Take 1 tablet by mouth daily.     . montelukast (SINGULAIR) 10 MG tablet Take 10 mg by mouth daily.     Marland Kitchen nystatin (MYCOSTATIN/NYSTOP) powder Apply 1 application topically daily.     Marland Kitchen tiZANidine (ZANAFLEX) 4 MG tablet Take 4 mg by mouth at bedtime.      . triamcinolone cream (KENALOG) 0.1 % Apply 1 application topically 2 (two) times daily as needed (itching).      No current facility-administered medications for this visit.   Allergies  Allergen Reactions  . Penicillins Anaphylaxis and Other (See Comments)    Has patient had a PCN reaction causing immediate rash, facial/tongue/throat swelling, SOB or lightheadedness with hypotension: Yes  Has patient had a PCN reaction causing severe rash involving mucus membranes or skin necrosis: No Has patient had a PCN reaction that required hospitalization: No Has patient had a PCN reaction occurring within the last 10 years: No If all of the above answers are "NO", then may proceed with Cephalosporin use.  . Prozac [Fluoxetine Hcl] Hives, Swelling and Other (See Comments)    Facial swelling    Social History   Tobacco Use  . Smoking status: Current Every Day Smoker    Years: 45.00    Types: Cigarettes  . Smokeless tobacco: Never Used  . Tobacco comment: Currently smoking about 2-3 cigs per day- taking Chantix   Substance Use Topics  . Alcohol use: Not Currently    Family History  Problem Relation Age of Onset  . Hypertension Mother   . Hyperlipidemia Mother   . Arthritis Father   . Asthma Son       Review of Systems  Constitutional: Negative.   HENT: Negative.   Respiratory:       Occasional shortness of breath on exertion and wheezing  Cardiovascular: Negative.   Gastrointestinal: Negative.   Genitourinary: Negative.   Musculoskeletal: Positive for arthralgias.  Skin: Negative.    Allergic/Immunologic: Positive for environmental allergies (Pollen).  Neurological: Negative.   Psychiatric/Behavioral: Negative.      Objective:  Physical Exam Constitutional:      Appearance: Normal appearance.  HENT:     Head: Normocephalic and atraumatic.  Eyes:     Extraocular Movements: Extraocular movements intact.     Pupils: Pupils are equal, round, and reactive to light.  Cardiovascular:     Rate and Rhythm: Normal rate and regular rhythm.     Pulses: Normal pulses.  Pulmonary:     Effort: Pulmonary effort is normal.     Breath sounds: Normal breath sounds.  Abdominal:     Palpations: Abdomen is soft.     Tenderness: There is no abdominal tenderness.  Genitourinary:    Comments: Deferred Musculoskeletal:     Cervical back: Normal range of motion and neck supple.     Comments: Left Knee:  Moderate  tenderness to palpation about the  lateral  joint line of the left knee. Tenderness along the tibial tray. AROM  0-90  degrees.  No  effusion noted.  Skin:    General: Skin is warm and dry.  Neurological:     General: No focal deficit present.     Mental Status: She is alert and oriented to person, place, and time.  Psychiatric:        Mood and Affect: Mood normal.     Vital signs in last 24 hours: @VSRANGES @  Labs:  Estimated body mass index is 36.13 kg/m as calculated from the following:   Height as of 09/23/19: 5' 4.8" (1.646 m).   Weight as of 09/23/19: 97.9 kg.  Imaging Review . The overall alignment is neutral.There is evidence of loosening of the tibial components. The bone quality appears to be adequate for age and reported activity level.     Assessment/Plan:  End stage arthritis, left knee(s) with failed previous arthroplasty.   The patient history, physical examination, clinical judgment of the provider and imaging studies are consistent with end stage degenerative joint disease of the left knee(s), previous total knee arthroplasty.  Revision total knee arthroplasty is deemed medically necessary. The treatment options including medical management, injection therapy, arthroscopy and revision arthroplasty were discussed at length. The risks and benefits of  revision total knee arthroplasty were presented and reviewed. The risks due to aseptic loosening, infection, stiffness, patella tracking problems, thromboembolic complications and other imponderables were discussed. The patient acknowledged the explanation, agreed to proceed with the plan and consent was signed. Patient is being admitted for inpatient treatment for surgery, pain control, PT, OT, prophylactic antibiotics, VTE prophylaxis, progressive ambulation and ADL's and discharge planning.The patient is planning to be discharged to skilled nursing facility

## 2019-11-04 ENCOUNTER — Other Ambulatory Visit (HOSPITAL_COMMUNITY)
Admission: RE | Admit: 2019-11-04 | Discharge: 2019-11-04 | Disposition: A | Discharge status: Home / Self Care | Payer: Medicare Other | Source: Ambulatory Visit | Attending: Orthopedic Surgery | Admitting: Orthopedic Surgery

## 2019-11-04 ENCOUNTER — Encounter (HOSPITAL_COMMUNITY)
Admission: RE | Admit: 2019-11-04 | Discharge: 2019-11-04 | Disposition: A | Discharge status: Home / Self Care | Payer: Medicare Other | Source: Ambulatory Visit | Attending: Orthopedic Surgery | Admitting: Orthopedic Surgery

## 2019-11-04 ENCOUNTER — Other Ambulatory Visit: Payer: Self-pay

## 2019-11-04 ENCOUNTER — Encounter (HOSPITAL_COMMUNITY): Payer: Self-pay

## 2019-11-04 DIAGNOSIS — Z01812 Encounter for preprocedural laboratory examination: Secondary | ICD-10-CM | POA: Insufficient documentation

## 2019-11-04 DIAGNOSIS — Z20822 Contact with and (suspected) exposure to covid-19: Secondary | ICD-10-CM | POA: Insufficient documentation

## 2019-11-04 HISTORY — DX: Chronic kidney disease, stage 3 unspecified: N18.30

## 2019-11-04 HISTORY — DX: Obesity, unspecified: E66.9

## 2019-11-04 LAB — COMPREHENSIVE METABOLIC PANEL
ALT: 23 U/L (ref 0–44)
AST: 25 U/L (ref 15–41)
Albumin: 4.4 g/dL (ref 3.5–5.0)
Alkaline Phosphatase: 48 U/L (ref 38–126)
Anion gap: 8 (ref 5–15)
BUN: 43 mg/dL — ABNORMAL HIGH (ref 8–23)
CO2: 24 mmol/L (ref 22–32)
Calcium: 8.9 mg/dL (ref 8.9–10.3)
Chloride: 101 mmol/L (ref 98–111)
Creatinine, Ser: 1.7 mg/dL — ABNORMAL HIGH (ref 0.44–1.00)
GFR calc Af Amer: 37 mL/min — ABNORMAL LOW (ref >60)
GFR calc non Af Amer: 32 mL/min — ABNORMAL LOW (ref >60)
Glucose, Bld: 74 mg/dL (ref 70–99)
Potassium: 3.9 mmol/L (ref 3.5–5.1)
Sodium: 133 mmol/L — ABNORMAL LOW (ref 135–145)
Total Bilirubin: 0.6 mg/dL (ref 0.3–1.2)
Total Protein: 7.1 g/dL (ref 6.5–8.1)

## 2019-11-04 LAB — URINALYSIS, ROUTINE W REFLEX MICROSCOPIC
Bilirubin Urine: NEGATIVE
Glucose, UA: NEGATIVE mg/dL
Hgb urine dipstick: NEGATIVE
Ketones, ur: NEGATIVE mg/dL
Nitrite: NEGATIVE
Protein, ur: NEGATIVE mg/dL
Specific Gravity, Urine: 1.011 (ref 1.005–1.030)
pH: 5 (ref 5.0–8.0)

## 2019-11-04 LAB — PROTIME-INR
INR: 1 (ref 0.8–1.2)
Prothrombin Time: 12.5 seconds (ref 11.4–15.2)

## 2019-11-04 LAB — CBC
HCT: 37.9 % (ref 36.0–46.0)
Hemoglobin: 12.3 g/dL (ref 12.0–15.0)
MCH: 28.1 pg (ref 26.0–34.0)
MCHC: 32.5 g/dL (ref 30.0–36.0)
MCV: 86.7 fL (ref 80.0–100.0)
Platelets: 160 10*3/uL (ref 150–400)
RBC: 4.37 MIL/uL (ref 3.87–5.11)
RDW: 16.8 % — ABNORMAL HIGH (ref 11.5–15.5)
WBC: 4.9 10*3/uL (ref 4.0–10.5)
nRBC: 0 % (ref 0.0–0.2)

## 2019-11-04 LAB — SARS CORONAVIRUS 2 (TAT 6-24 HRS): SARS Coronavirus 2: NEGATIVE

## 2019-11-04 LAB — SURGICAL PCR SCREEN
MRSA, PCR: NEGATIVE
Staphylococcus aureus: NEGATIVE

## 2019-11-04 NOTE — Progress Notes (Addendum)
COVID Vaccine Completed:Yes Date COVID Vaccine completed:08/30/19 COVID vaccine manufacturer: Engineer, production in [place]  PCP - Dr. Adela Glimpse Cardiologist - Dr. Zannie Cove Clearances requested from care everywhere  Chest x-ray - 2018 EKG - 04/18/19 Stress Test -  ECHO - 04/18/19 Cardiac Cath - no  Sleep Study - yes she has OSA but doesn't use the mask because she was geting URI and soar throats CPAP - yes , she uses it PRN and rarely in the last year  Fasting Blood Sugar - NA Checks Blood Sugar _____ times a day  Blood Thinner Instructions:NA Aspirin Instructions: Last Dose:  Anesthesia review:   Patient denies shortness of breath, fever, cough and chest pain at PAT appointment yes   Patient verbalized understanding of instructions that were given to them at the PAT appointment. Patient was also instructed that they will need to review over the PAT instructions again at home before surgery. Yes  Pt has lost wt after gastric surgery whoever she is sedentary. She has COPD and does get SOB sometimes with ADLs.  She quit smoking 09/30/19.

## 2019-11-05 NOTE — Progress Notes (Signed)
Anesthesia Chart Review   Case: [id number] Date/Time: 11/07/19 1015   Procedure: TOTAL KNEE REVISION (Left Knee)   Anesthesia type: Spinal   Pre-op diagnosis: Failed left total knee arthroplasty   [facility]   Surgeons: Rod Can, MD      DISCUSSION:64 y.o. former smoker (45 pack years, quit 09/30/19) with h/o sleep apnea, COPD, CKD Stage III (creatinine stable), HTN, failed left total knee arthroplasty scheduled for above procedure 11/07/2019 with Dr. Rod Can.   Pt seen by cardiology 10/23/2019 for preoperative evaluation.  Per OV note, "I do not think she needs a stress test. She has no previous cardiac history, no cardiac symptoms, minimal risk factors, and has done well with surgery as recently as March of this year (shoulder surgery). Recommend hemodynamic monitoring (which will be done anyway), continue medications."  Anticipate pt can proceed with planned procedure barring acute status change.   VS: BP (!) 109/50   Pulse 70   Temp 36.9 C (Oral)   Resp 16   Ht 5\' 5"  (1.651 m)   Wt 93.4 kg   SpO2 100%   BMI 34.28 kg/m   PROVIDERS: Kruppenbach, Katherine H, FNP is PCP last seen 10/20/2019  Forde Dandy, MD is Cardiologist  LABS: Creatinine stable  (all labs ordered are listed, but only abnormal results are displayed)  Labs Reviewed  CBC - Abnormal; Notable for the following components:      Result Value   RDW 16.8 (*)    All other components within normal limits  COMPREHENSIVE METABOLIC PANEL - Abnormal; Notable for the following components:   Sodium 133 (*)    BUN 43 (*)    Creatinine, Ser 1.70 (*)    GFR calc non Af Amer 32 (*)    GFR calc Af Amer 37 (*)    All other components within normal limits  URINALYSIS, ROUTINE W REFLEX MICROSCOPIC - Abnormal; Notable for the following components:   Leukocytes,Ua TRACE (*)    Bacteria, UA RARE (*)    All other components within normal limits  SURGICAL PCR SCREEN  PROTIME-INR  TYPE AND  SCREEN     IMAGES:   EKG: 04/18/2019 Rate 63 bpm Normal sinus rhythm   CV:  Past Medical History:  Diagnosis Date  . Acidosis 10/25/2016  . Acute encephalopathy 10/25/2016  . Acute kidney injury ([place])   . Acute respiratory failure ([place]) 10/25/2016  . Anemia   . Ankle pain 07/10/2017  . Anxiety    no meds  . Arthritis   . Asthma   . Bipolar disorder, now depressed ([place]) 08/10/2014   not on meds at this time  . Cancer ([place])    skin cancer, uterine cancer  . CKD (chronic kidney disease), stage III   . Closed displaced trimalleolar fracture of right ankle 01/05/2017  . Cocaine use disorder, mild, in early remission ([place]) 08/10/2014  . COPD (chronic obstructive pulmonary disease) ([place])   . Depression   . Dyspnea    with exertion  . Essential hypertension 10/25/2016   takes meds as needed  . History of kidney stones   . Hypertension   . Hyponatremia   . Nausea without vomiting 10/25/2016  . Obese   . Obesity 10/25/2016  . Osteoarthritis   . Osteoarthritis of left knee 10/23/2016  . Sleep apnea    does not use CPAP "Makes too much Noise"  . Tachycardia 10/25/2016  . Thoracic aortic aneurysm ([place]) 09/2016   4.1  cm ascending thoracic aortic aneurysm.  . Thrombocytopenia ([place]) 09/2016    Past Surgical History:  Procedure Laterality Date  . ABDOMINAL HYSTERECTOMY  1988  . ACHILLES TENDON REPAIR Left   . CARPAL TUNNEL RELEASE Bilateral 2007  . CATARACT EXTRACTION, BILATERAL  2020  . FRACTURE SURGERY Right 2005   wrist  . GASTRIC BYPASS    . JOINT REPLACEMENT Right    knee replace  . KNEE ARTHROPLASTY Left 10/23/2016   Procedure: COMPUTER ASSISTED TOTAL KNEE ARTHROPLASTY;  Surgeon: Rod Can, MD;  Location: [facility];  Service: Orthopedics;  Laterality: Left;  . ORIF ANKLE FRACTURE Right 01/06/2017   Procedure: OPEN REDUCTION INTERNAL FIXATION (ORIF) ANKLE FRACTURE;  Surgeon: Wylene Simmer, MD;  Location: [facility];  Service: Orthopedics;  Laterality: Right;  . ROTATOR CUFF  REPAIR  2021  . TONSILLECTOMY  1968    MEDICATIONS: . oxyCODONE-acetaminophen (PERCOCET) 10-325 MG tablet  . albuterol (VENTOLIN HFA) 108 (90 Base) MCG/ACT inhaler  . alendronate (FOSAMAX) 70 MG tablet  . Ascorbic Acid (VITAMIN C PO)  . azelastine (ASTELIN) 0.1 % nasal spray  . B Complex Vitamins (VITAMIN B COMPLEX PO)  . CHANTIX 1 MG tablet  . Cholecalciferol (VITAMIN D3) 125 MCG (5000 UT) CAPS  . COMBIVENT RESPIMAT 20-100 MCG/ACT AERS respimat  . ferrous sulfate 325 (65 FE) MG EC tablet  . fluticasone (FLONASE) 50 MCG/ACT nasal spray  . Fluticasone-Umeclidin-Vilant (TRELEGY ELLIPTA) 200-62.5-25 MCG/INH AEPB  . furosemide (LASIX) 40 MG tablet  . gabapentin (NEURONTIN) 300 MG capsule  . HYDROcodone-acetaminophen (NORCO/VICODIN) 5-325 MG tablet  . ketoconazole (NIZORAL) 2 % shampoo  . levocetirizine (XYZAL) 5 MG tablet  . lisinopril-hydrochlorothiazide (ZESTORETIC) 10-12.5 MG tablet  . montelukast (SINGULAIR) 10 MG tablet  . nystatin (MYCOSTATIN/NYSTOP) powder  . tiZANidine (ZANAFLEX) 4 MG tablet  . triamcinolone cream (KENALOG) 0.1 %   No current facility-administered medications for this encounter.     Maia Plan [facility] Pre-Surgical Testing [phone] 11/05/19  2:13 PM

## 2019-11-06 NOTE — Anesthesia Preprocedure Evaluation (Addendum)
Anesthesia Evaluation  Patient identified by MRN, date of birth, ID band Patient awake    Reviewed: Allergy & Precautions, H&P , NPO status , Patient's Chart, lab work & pertinent test results  Airway Mallampati: III  TM Distance: >3 FB Neck ROM: Full    Dental no notable dental hx. (+) Edentulous Upper, Upper Dentures   Pulmonary neg pulmonary ROS, shortness of breath, asthma , sleep apnea , COPD, former smoker,    Pulmonary exam normal breath sounds clear to auscultation       Cardiovascular Exercise Tolerance: Good hypertension, Pt. on medications + Peripheral Vascular Disease  negative cardio ROS Normal cardiovascular exam Rhythm:Regular Rate:Normal  EKG: 04/18/2019 Rate 63 bpm Normal sinus rhythm    Neuro/Psych PSYCHIATRIC DISORDERS Anxiety Depression Bipolar Disorder negative neurological ROS  negative psych ROS   GI/Hepatic negative GI ROS, Neg liver ROS,   Endo/Other  negative endocrine ROSMorbid obesity  Renal/GU CRFRenal diseasenegative Renal ROS  negative genitourinary   Musculoskeletal negative musculoskeletal ROS (+) Arthritis , Osteoarthritis,    Abdominal   Peds negative pediatric ROS (+)  Hematology negative hematology ROS (+)   Anesthesia Other Findings   Reproductive/Obstetrics negative OB ROS                            Anesthesia Physical Anesthesia Plan  ASA: III  Anesthesia Plan: Spinal   Post-op Pain Management:  Regional for Post-op pain   Induction:   PONV Risk Score and Plan:   Airway Management Planned:   Additional Equipment:   Intra-op Plan:   Post-operative Plan:   Informed Consent: I have reviewed the patients History and Physical, chart, labs and discussed the procedure including the risks, benefits and alternatives for the proposed anesthesia with the patient or authorized representative who has indicated his/her understanding and  acceptance.       Plan Discussed with: Anesthesiologist  Anesthesia Plan Comments: (64 y.o. former smoker (45 pack years, quit 09/30/19) with h/o sleep apnea, COPD, CKD Stage III (creatinine stable), HTN, failed left total knee arthroplasty scheduled for above procedure 11/07/2019 with Dr. Rod Can.   Pt seen by cardiology 10/23/2019 for preoperative evaluation.  Per OV note, "I do not think she needs a stress test. She has no previous cardiac history, no cardiac symptoms, minimal risk factors, and has done well with surgery as recently as March of this year (shoulder surgery).)        Anesthesia Quick Evaluation

## 2019-11-07 ENCOUNTER — Inpatient Hospital Stay (HOSPITAL_COMMUNITY): Payer: Medicare Other

## 2019-11-07 ENCOUNTER — Ambulatory Visit (HOSPITAL_COMMUNITY): Payer: Medicare Other | Admitting: Physician Assistant

## 2019-11-07 ENCOUNTER — Inpatient Hospital Stay (HOSPITAL_COMMUNITY)
Admission: RE | Admit: 2019-11-07 | Discharge: 2019-11-11 | DRG: 467 | Disposition: A | Discharge status: Skilled Nursing Facility | Payer: Medicare Other | Attending: Orthopedic Surgery | Admitting: Orthopedic Surgery

## 2019-11-07 ENCOUNTER — Other Ambulatory Visit: Payer: Self-pay

## 2019-11-07 ENCOUNTER — Encounter (HOSPITAL_COMMUNITY)
Admission: RE | Disposition: A | Discharge status: Skilled Nursing Facility | Payer: Self-pay | Source: Home / Self Care | Attending: Orthopedic Surgery

## 2019-11-07 ENCOUNTER — Ambulatory Visit (HOSPITAL_COMMUNITY): Payer: Medicare Other | Admitting: Certified Registered Nurse Anesthetist

## 2019-11-07 ENCOUNTER — Encounter (HOSPITAL_COMMUNITY): Payer: Self-pay | Admitting: Orthopedic Surgery

## 2019-11-07 DIAGNOSIS — Z888 Allergy status to other drugs, medicaments and biological substances status: Secondary | ICD-10-CM

## 2019-11-07 DIAGNOSIS — Z7952 Long term (current) use of systemic steroids: Secondary | ICD-10-CM

## 2019-11-07 DIAGNOSIS — E662 Morbid (severe) obesity with alveolar hypoventilation: Secondary | ICD-10-CM | POA: Diagnosis present

## 2019-11-07 DIAGNOSIS — D649 Anemia, unspecified: Secondary | ICD-10-CM | POA: Diagnosis present

## 2019-11-07 DIAGNOSIS — Z87442 Personal history of urinary calculi: Secondary | ICD-10-CM | POA: Diagnosis not present

## 2019-11-07 DIAGNOSIS — I129 Hypertensive chronic kidney disease with stage 1 through stage 4 chronic kidney disease, or unspecified chronic kidney disease: Secondary | ICD-10-CM | POA: Diagnosis present

## 2019-11-07 DIAGNOSIS — Z20822 Contact with and (suspected) exposure to covid-19: Secondary | ICD-10-CM | POA: Diagnosis not present

## 2019-11-07 DIAGNOSIS — Z880 Allergy status to penicillin: Secondary | ICD-10-CM | POA: Diagnosis not present

## 2019-11-07 DIAGNOSIS — Y792 Prosthetic and other implants, materials and accessory orthopedic devices associated with adverse incidents: Secondary | ICD-10-CM | POA: Diagnosis present

## 2019-11-07 DIAGNOSIS — Z90710 Acquired absence of both cervix and uterus: Secondary | ICD-10-CM

## 2019-11-07 DIAGNOSIS — N183 Chronic kidney disease, stage 3 unspecified: Secondary | ICD-10-CM | POA: Diagnosis present

## 2019-11-07 DIAGNOSIS — Z8542 Personal history of malignant neoplasm of other parts of uterus: Secondary | ICD-10-CM

## 2019-11-07 DIAGNOSIS — Z79891 Long term (current) use of opiate analgesic: Secondary | ICD-10-CM

## 2019-11-07 DIAGNOSIS — J449 Chronic obstructive pulmonary disease, unspecified: Secondary | ICD-10-CM | POA: Diagnosis present

## 2019-11-07 DIAGNOSIS — T84093A Other mechanical complication of internal left knee prosthesis, initial encounter: Principal | ICD-10-CM | POA: Diagnosis present

## 2019-11-07 DIAGNOSIS — M1990 Unspecified osteoarthritis, unspecified site: Secondary | ICD-10-CM | POA: Diagnosis present

## 2019-11-07 DIAGNOSIS — Z79899 Other long term (current) drug therapy: Secondary | ICD-10-CM | POA: Diagnosis not present

## 2019-11-07 DIAGNOSIS — Z85828 Personal history of other malignant neoplasm of skin: Secondary | ICD-10-CM | POA: Diagnosis not present

## 2019-11-07 DIAGNOSIS — F17210 Nicotine dependence, cigarettes, uncomplicated: Secondary | ICD-10-CM | POA: Diagnosis present

## 2019-11-07 DIAGNOSIS — Z7951 Long term (current) use of inhaled steroids: Secondary | ICD-10-CM | POA: Diagnosis not present

## 2019-11-07 DIAGNOSIS — Z96652 Presence of left artificial knee joint: Secondary | ICD-10-CM

## 2019-11-07 HISTORY — PX: TOTAL KNEE REVISION: SHX996

## 2019-11-07 LAB — RAPID URINE DRUG SCREEN, HOSP PERFORMED
Amphetamines: NOT DETECTED
Barbiturates: NOT DETECTED
Benzodiazepines: NOT DETECTED
Cocaine: NOT DETECTED
Opiates: POSITIVE — AB
Tetrahydrocannabinol: NOT DETECTED

## 2019-11-07 LAB — TYPE AND SCREEN
ABO/RH(D): B POS
Antibody Screen: NEGATIVE

## 2019-11-07 SURGERY — TOTAL KNEE REVISION
Anesthesia: Spinal | Site: Knee | Laterality: Left

## 2019-11-07 MED ORDER — FENTANYL CITRATE (PF) 100 MCG/2ML IJ SOLN
25.0000 ug | INTRAMUSCULAR | Status: DC | PRN
Start: 2019-11-07 — End: 2019-11-07
  Administered 2019-11-07: 50 ug via INTRAVENOUS

## 2019-11-07 MED ORDER — LACTATED RINGERS IV SOLN: INTRAVENOUS | Status: DC

## 2019-11-07 MED ORDER — APIXABAN 2.5 MG PO TABS
2.5000 mg | ORAL_TABLET | Freq: Two times a day (BID) | ORAL | 0 refills | Status: DC
Start: 2019-11-07 — End: 2020-01-13

## 2019-11-07 MED ORDER — FENTANYL CITRATE (PF) 100 MCG/2ML IJ SOLN
INTRAMUSCULAR | Status: AC
  Filled 2019-11-07: qty 2

## 2019-11-07 MED ORDER — ONDANSETRON HCL 4 MG/2ML IJ SOLN: 4.0000 mg | Freq: Four times a day (QID) | INTRAMUSCULAR | Status: DC | PRN

## 2019-11-07 MED ORDER — GLYCOPYRROLATE PF 0.2 MG/ML IJ SOSY
PREFILLED_SYRINGE | INTRAMUSCULAR | Status: DC | PRN
  Administered 2019-11-07: .2 mg via INTRAVENOUS

## 2019-11-07 MED ORDER — GLYCOPYRROLATE PF 0.2 MG/ML IJ SOSY
PREFILLED_SYRINGE | INTRAMUSCULAR | Status: AC
  Filled 2019-11-07: qty 1

## 2019-11-07 MED ORDER — BUPIVACAINE HCL (PF) 0.5 % IJ SOLN
INTRAMUSCULAR | Status: DC | PRN
  Administered 2019-11-07: 3 mL

## 2019-11-07 MED ORDER — METHOCARBAMOL 500 MG IVPB - SIMPLE MED
500.0000 mg | Freq: Four times a day (QID) | INTRAVENOUS | Status: DC | PRN
  Filled 2019-11-07: qty 50

## 2019-11-07 MED ORDER — KETOROLAC TROMETHAMINE 30 MG/ML IJ SOLN
INTRAMUSCULAR | Status: DC | PRN
  Administered 2019-11-07: 30 mg

## 2019-11-07 MED ORDER — ASCORBIC ACID 500 MG PO TABS
1000.0000 mg | ORAL_TABLET | Freq: Every day | ORAL | Status: DC
  Administered 2019-11-08 – 2019-11-11 (×4): 1000 mg via ORAL
  Filled 2019-11-07 (×4): qty 2

## 2019-11-07 MED ORDER — APIXABAN 2.5 MG PO TABS
2.5000 mg | ORAL_TABLET | Freq: Two times a day (BID) | ORAL | Status: DC
  Administered 2019-11-08 – 2019-11-11 (×7): 2.5 mg via ORAL
  Filled 2019-11-07 (×7): qty 1

## 2019-11-07 MED ORDER — ACETAMINOPHEN 160 MG/5ML PO SOLN: 325.0000 mg | ORAL | Status: DC | PRN

## 2019-11-07 MED ORDER — METOCLOPRAMIDE HCL 5 MG/ML IJ SOLN: 5.0000 mg | Freq: Three times a day (TID) | INTRAMUSCULAR | Status: DC | PRN

## 2019-11-07 MED ORDER — PHENYLEPHRINE HCL (PRESSORS) 10 MG/ML IV SOLN
INTRAVENOUS | Status: AC
  Filled 2019-11-07: qty 1

## 2019-11-07 MED ORDER — ALBUTEROL SULFATE HFA 108 (90 BASE) MCG/ACT IN AERS: 2.0000 | INHALATION_SPRAY | Freq: Four times a day (QID) | RESPIRATORY_TRACT | Status: DC | PRN

## 2019-11-07 MED ORDER — LISINOPRIL 10 MG PO TABS: 10.0000 mg | ORAL_TABLET | Freq: Every day | ORAL | Status: DC

## 2019-11-07 MED ORDER — ACETAMINOPHEN 325 MG PO TABS: 325.0000 mg | ORAL_TABLET | ORAL | Status: DC | PRN

## 2019-11-07 MED ORDER — SODIUM CHLORIDE (PF) 0.9 % IJ SOLN
INTRAMUSCULAR | Status: AC
  Filled 2019-11-07: qty 50

## 2019-11-07 MED ORDER — VANCOMYCIN HCL IN DEXTROSE 1-5 GM/200ML-% IV SOLN
1000.0000 mg | Freq: Two times a day (BID) | INTRAVENOUS | Status: AC
  Administered 2019-11-07: 1000 mg via INTRAVENOUS
  Filled 2019-11-07: qty 200

## 2019-11-07 MED ORDER — PROPOFOL 500 MG/50ML IV EMUL
INTRAVENOUS | Status: DC | PRN
  Administered 2019-11-07: 60 ug/kg/min via INTRAVENOUS

## 2019-11-07 MED ORDER — ONDANSETRON HCL 4 MG PO TABS: 4.0000 mg | ORAL_TABLET | Freq: Four times a day (QID) | ORAL | Status: DC | PRN

## 2019-11-07 MED ORDER — ONDANSETRON HCL 4 MG/2ML IJ SOLN
INTRAMUSCULAR | Status: AC
  Filled 2019-11-07: qty 2

## 2019-11-07 MED ORDER — LISINOPRIL-HYDROCHLOROTHIAZIDE 10-12.5 MG PO TABS: 1.0000 | ORAL_TABLET | Freq: Every day | ORAL | Status: DC

## 2019-11-07 MED ORDER — PROPOFOL 10 MG/ML IV BOLUS
INTRAVENOUS | Status: AC
  Filled 2019-11-07: qty 20

## 2019-11-07 MED ORDER — ACETAMINOPHEN 325 MG PO TABS
325.0000 mg | ORAL_TABLET | Freq: Four times a day (QID) | ORAL | Status: DC | PRN
  Administered 2019-11-09 – 2019-11-10 (×4): 650 mg via ORAL
  Filled 2019-11-07 (×4): qty 2

## 2019-11-07 MED ORDER — ROPIVACAINE HCL 7.5 MG/ML IJ SOLN
INTRAMUSCULAR | Status: DC | PRN
  Administered 2019-11-07: 25 mL via PERINEURAL

## 2019-11-07 MED ORDER — AZELASTINE HCL 0.1 % NA SOLN: 2.0000 | Freq: Two times a day (BID) | NASAL | Status: DC | PRN

## 2019-11-07 MED ORDER — OXYCODONE HCL 5 MG PO TABS: 5.0000 mg | ORAL_TABLET | Freq: Once | ORAL | Status: DC | PRN

## 2019-11-07 MED ORDER — DEXAMETHASONE SODIUM PHOSPHATE 10 MG/ML IJ SOLN
INTRAMUSCULAR | Status: AC
  Filled 2019-11-07: qty 1

## 2019-11-07 MED ORDER — GABAPENTIN 300 MG PO CAPS
600.0000 mg | ORAL_CAPSULE | Freq: Four times a day (QID) | ORAL | Status: DC
  Administered 2019-11-07 – 2019-11-11 (×15): 600 mg via ORAL
  Filled 2019-11-07 (×16): qty 2

## 2019-11-07 MED ORDER — ONDANSETRON HCL 4 MG/2ML IJ SOLN
INTRAMUSCULAR | Status: DC | PRN
  Administered 2019-11-07: 4 mg via INTRAVENOUS

## 2019-11-07 MED ORDER — VANCOMYCIN HCL IN DEXTROSE 1-5 GM/200ML-% IV SOLN
1000.0000 mg | INTRAVENOUS | Status: AC
  Administered 2019-11-07: 1000 mg via INTRAVENOUS
  Filled 2019-11-07: qty 200

## 2019-11-07 MED ORDER — FENTANYL CITRATE (PF) 100 MCG/2ML IJ SOLN
INTRAMUSCULAR | Status: AC
  Administered 2019-11-07: 100 ug
  Filled 2019-11-07: qty 2

## 2019-11-07 MED ORDER — CHLORHEXIDINE GLUCONATE 0.12 % MT SOLN
15.0000 mL | Freq: Once | OROMUCOSAL | Status: AC
  Administered 2019-11-07: 15 mL via OROMUCOSAL

## 2019-11-07 MED ORDER — PHENOL 1.4 % MT LIQD: 1.0000 | OROMUCOSAL | Status: DC | PRN

## 2019-11-07 MED ORDER — MIDAZOLAM HCL 2 MG/2ML IJ SOLN
INTRAMUSCULAR | Status: AC
  Administered 2019-11-07: 2 mg
  Filled 2019-11-07: qty 2

## 2019-11-07 MED ORDER — OXYCODONE HCL 5 MG PO TABS
10.0000 mg | ORAL_TABLET | ORAL | Status: DC | PRN
  Administered 2019-11-07: 15 mg via ORAL
  Administered 2019-11-08 (×3): 10 mg via ORAL
  Administered 2019-11-08 – 2019-11-09 (×3): 15 mg via ORAL
  Filled 2019-11-07: qty 3
  Filled 2019-11-07: qty 2
  Filled 2019-11-07 (×3): qty 3
  Filled 2019-11-07 (×2): qty 2

## 2019-11-07 MED ORDER — ACETAMINOPHEN 10 MG/ML IV SOLN
1000.0000 mg | Freq: Four times a day (QID) | INTRAVENOUS | Status: DC
  Administered 2019-11-07: 1000 mg via INTRAVENOUS
  Filled 2019-11-07: qty 100

## 2019-11-07 MED ORDER — FUROSEMIDE 40 MG PO TABS
40.0000 mg | ORAL_TABLET | Freq: Every day | ORAL | Status: DC
  Administered 2019-11-07 – 2019-11-11 (×5): 40 mg via ORAL
  Filled 2019-11-07 (×5): qty 1

## 2019-11-07 MED ORDER — BUPIVACAINE HCL (PF) 0.5 % IJ SOLN
INTRAMUSCULAR | Status: AC
  Filled 2019-11-07: qty 30

## 2019-11-07 MED ORDER — VANCOMYCIN HCL 1000 MG IV SOLR
INTRAVENOUS | Status: AC
  Filled 2019-11-07: qty 1000

## 2019-11-07 MED ORDER — VANCOMYCIN HCL 1000 MG IV SOLR
INTRAVENOUS | Status: DC | PRN
  Administered 2019-11-07: 1000 mg via TOPICAL

## 2019-11-07 MED ORDER — POLYETHYLENE GLYCOL 3350 17 G PO PACK: 17.0000 g | PACK | Freq: Every day | ORAL | Status: DC | PRN

## 2019-11-07 MED ORDER — DIPHENHYDRAMINE HCL 12.5 MG/5ML PO ELIX
12.5000 mg | ORAL_SOLUTION | ORAL | Status: DC | PRN
  Administered 2019-11-09 – 2019-11-10 (×3): 12.5 mg via ORAL
  Filled 2019-11-07 (×3): qty 5

## 2019-11-07 MED ORDER — METOCLOPRAMIDE HCL 5 MG PO TABS: 5.0000 mg | ORAL_TABLET | Freq: Three times a day (TID) | ORAL | Status: DC | PRN

## 2019-11-07 MED ORDER — METHOCARBAMOL 500 MG PO TABS
500.0000 mg | ORAL_TABLET | Freq: Four times a day (QID) | ORAL | Status: DC | PRN
  Administered 2019-11-07 – 2019-11-11 (×11): 500 mg via ORAL
  Filled 2019-11-07 (×11): qty 1

## 2019-11-07 MED ORDER — MENTHOL 3 MG MT LOZG
1.0000 | LOZENGE | OROMUCOSAL | Status: DC | PRN
  Administered 2019-11-10: 3 mg via ORAL
  Filled 2019-11-07: qty 9

## 2019-11-07 MED ORDER — ISOPROPYL ALCOHOL 70 % SOLN
Status: AC
  Filled 2019-11-07: qty 480

## 2019-11-07 MED ORDER — OXYCODONE HCL 5 MG PO TABS
5.0000 mg | ORAL_TABLET | ORAL | Status: DC | PRN
  Administered 2019-11-08: 10 mg via ORAL
  Filled 2019-11-07: qty 2

## 2019-11-07 MED ORDER — EPHEDRINE SULFATE-NACL 50-0.9 MG/10ML-% IV SOSY
PREFILLED_SYRINGE | INTRAVENOUS | Status: DC | PRN
  Administered 2019-11-07 (×3): 10 mg via INTRAVENOUS

## 2019-11-07 MED ORDER — KETOROLAC TROMETHAMINE 30 MG/ML IJ SOLN
INTRAMUSCULAR | Status: AC
  Filled 2019-11-07: qty 1

## 2019-11-07 MED ORDER — ONDANSETRON HCL 4 MG/2ML IJ SOLN: 4.0000 mg | Freq: Once | INTRAMUSCULAR | Status: DC | PRN

## 2019-11-07 MED ORDER — DEXAMETHASONE SODIUM PHOSPHATE 10 MG/ML IJ SOLN
INTRAMUSCULAR | Status: DC | PRN
  Administered 2019-11-07: 10 mg via INTRAVENOUS

## 2019-11-07 MED ORDER — BUPIVACAINE-EPINEPHRINE 0.25% -1:200000 IJ SOLN
INTRAMUSCULAR | Status: DC | PRN
  Administered 2019-11-07: 30 mL

## 2019-11-07 MED ORDER — DEXAMETHASONE SODIUM PHOSPHATE 10 MG/ML IJ SOLN
10.0000 mg | Freq: Once | INTRAMUSCULAR | Status: AC
  Administered 2019-11-08: 10 mg via INTRAVENOUS
  Filled 2019-11-07: qty 1

## 2019-11-07 MED ORDER — SODIUM CHLORIDE 0.9 % IV SOLN: INTRAVENOUS | Status: DC

## 2019-11-07 MED ORDER — OXYCODONE HCL 10 MG PO TABS: 10.0000 mg | ORAL_TABLET | Freq: Four times a day (QID) | ORAL | 0 refills | Status: AC | PRN

## 2019-11-07 MED ORDER — POVIDONE-IODINE 10 % EX SWAB: 2.0000 "application " | Freq: Once | CUTANEOUS | Status: DC

## 2019-11-07 MED ORDER — POVIDONE-IODINE 10 % EX SWAB
2.0000 "application " | Freq: Once | CUTANEOUS | Status: AC
  Administered 2019-11-07: 2 via TOPICAL

## 2019-11-07 MED ORDER — ISOPROPYL ALCOHOL 70 % SOLN
Status: DC | PRN
  Administered 2019-11-07: 1 via TOPICAL

## 2019-11-07 MED ORDER — SODIUM CHLORIDE (PF) 0.9 % IJ SOLN
INTRAMUSCULAR | Status: DC | PRN
  Administered 2019-11-07: 30 mL

## 2019-11-07 MED ORDER — OXYCODONE HCL 5 MG/5ML PO SOLN: 5.0000 mg | Freq: Once | ORAL | Status: DC | PRN

## 2019-11-07 MED ORDER — DOCUSATE SODIUM 100 MG PO CAPS
100.0000 mg | ORAL_CAPSULE | Freq: Two times a day (BID) | ORAL | Status: DC
  Administered 2019-11-07 – 2019-11-11 (×8): 100 mg via ORAL
  Filled 2019-11-07 (×8): qty 1

## 2019-11-07 MED ORDER — MONTELUKAST SODIUM 10 MG PO TABS
10.0000 mg | ORAL_TABLET | Freq: Every day | ORAL | Status: DC
  Administered 2019-11-07 – 2019-11-11 (×5): 10 mg via ORAL
  Filled 2019-11-07 (×5): qty 1

## 2019-11-07 MED ORDER — BUPIVACAINE-EPINEPHRINE (PF) 0.25% -1:200000 IJ SOLN
INTRAMUSCULAR | Status: AC
  Filled 2019-11-07: qty 30

## 2019-11-07 MED ORDER — HYDROMORPHONE HCL 1 MG/ML IJ SOLN
0.5000 mg | INTRAMUSCULAR | Status: DC | PRN
  Administered 2019-11-07 – 2019-11-09 (×7): 1 mg via INTRAVENOUS
  Filled 2019-11-07 (×8): qty 1

## 2019-11-07 MED ORDER — MEPERIDINE HCL 50 MG/ML IJ SOLN: 6.2500 mg | INTRAMUSCULAR | Status: DC | PRN

## 2019-11-07 MED ORDER — FLUTICASONE PROPIONATE 50 MCG/ACT NA SUSP: 2.0000 | Freq: Two times a day (BID) | NASAL | Status: DC | PRN

## 2019-11-07 MED ORDER — ORAL CARE MOUTH RINSE: 15.0000 mL | Freq: Once | OROMUCOSAL | Status: AC

## 2019-11-07 MED ORDER — PROPOFOL 10 MG/ML IV BOLUS
INTRAVENOUS | Status: DC | PRN
  Administered 2019-11-07 (×5): 20 mg via INTRAVENOUS

## 2019-11-07 MED ORDER — HYDROCHLOROTHIAZIDE 12.5 MG PO CAPS: 12.5000 mg | ORAL_CAPSULE | Freq: Every day | ORAL | Status: DC

## 2019-11-07 MED ORDER — IPRATROPIUM-ALBUTEROL 0.5-2.5 (3) MG/3ML IN SOLN: 3.0000 mL | Freq: Four times a day (QID) | RESPIRATORY_TRACT | Status: DC | PRN

## 2019-11-07 MED ORDER — PHENYLEPHRINE HCL-NACL 10-0.9 MG/250ML-% IV SOLN
INTRAVENOUS | Status: DC | PRN
  Administered 2019-11-07: 40 ug/min via INTRAVENOUS

## 2019-11-07 MED ORDER — TRANEXAMIC ACID-NACL 1000-0.7 MG/100ML-% IV SOLN
1000.0000 mg | INTRAVENOUS | Status: AC
  Administered 2019-11-07: 1000 mg via INTRAVENOUS
  Filled 2019-11-07: qty 100

## 2019-11-07 MED ORDER — ALUM & MAG HYDROXIDE-SIMETH 200-200-20 MG/5ML PO SUSP: 30.0000 mL | ORAL | Status: DC | PRN

## 2019-11-07 MED ORDER — FLUTICASONE-UMECLIDIN-VILANT 200-62.5-25 MCG/INH IN AEPB: 1.0000 | INHALATION_SPRAY | Freq: Every day | RESPIRATORY_TRACT | Status: DC

## 2019-11-07 SURGICAL SUPPLY — 80 items
AUGMENT TIBIAL HALF BLOCK 10MM (Knees) ×1 IMPLANT
AUGMENT TIBIAL SYM CONE SZ C (Miscellaneous) ×3 IMPLANT
BAG DECANTER FOR FLEXI CONT (MISCELLANEOUS) ×6 IMPLANT
BAG ZIPLOCK 12X15 (MISCELLANEOUS) ×3 IMPLANT
BASEPLATE TIBIAL UNV SZ5 (Plate) ×3 IMPLANT
BLADE SAW RECIPROCATING 77.5 (BLADE) ×3 IMPLANT
BLADE SAW SGTL 81X20 HD (BLADE) ×3 IMPLANT
BLADE SURG SZ10 CARB STEEL (BLADE) ×3 IMPLANT
BLOCK HALF TIBIAL AUGMENT (Orthopedic Implant) ×3 IMPLANT
BLOCK HALF TIBIAL AUGMENT KNEE (Block) IMPLANT
BNDG ELASTIC 3X5.8 VLCR STR LF (GAUZE/BANDAGES/DRESSINGS) ×3 IMPLANT
BNDG ELASTIC 6X5.8 VLCR STR LF (GAUZE/BANDAGES/DRESSINGS) ×3 IMPLANT
BONE CEMENT SIMPLEX TOBRAMYCIN (Cement) ×4 IMPLANT
CEMENT BONE SIMPLEX TOBRAMYCIN (Cement) ×2 IMPLANT
CEMENT RESTRICTOR BONE PREP ST (KITS) ×2 IMPLANT
CHLORAPREP W/TINT 26 (MISCELLANEOUS) ×6 IMPLANT
COVER SURGICAL LIGHT HANDLE (MISCELLANEOUS) ×3 IMPLANT
COVER WAND RF STERILE (DRAPES) IMPLANT
CUFF TOURN SGL QUICK 34 (TOURNIQUET CUFF) ×2
CUFF TRNQT CYL 34X4.125X (TOURNIQUET CUFF) ×1 IMPLANT
DECANTER SPIKE VIAL GLASS SM (MISCELLANEOUS) IMPLANT
DERMABOND ADVANCED (GAUZE/BANDAGES/DRESSINGS) ×4
DERMABOND ADVANCED .7 DNX12 (GAUZE/BANDAGES/DRESSINGS) ×2 IMPLANT
DRAPE SHEET LG 3/4 BI-LAMINATE (DRAPES) ×9 IMPLANT
DRAPE U-SHAPE 47X51 STRL (DRAPES) ×3 IMPLANT
DRSG AQUACEL AG ADV 3.5X10 (GAUZE/BANDAGES/DRESSINGS) ×3 IMPLANT
DRSG TEGADERM 4X4.75 (GAUZE/BANDAGES/DRESSINGS) IMPLANT
ELECT BLADE TIP CTD 4 INCH (ELECTRODE) ×3 IMPLANT
ELECT REM PT RETURN 15FT ADLT (MISCELLANEOUS) ×3 IMPLANT
EVACUATOR 1/8 PVC DRAIN (DRAIN) ×3 IMPLANT
GLOVE BIO SURGEON STRL SZ8.5 (GLOVE) ×6 IMPLANT
GLOVE BIOGEL PI IND STRL 7.0 (GLOVE) ×1 IMPLANT
GLOVE BIOGEL PI IND STRL 7.5 (GLOVE) ×1 IMPLANT
GLOVE BIOGEL PI IND STRL 8.5 (GLOVE) ×1 IMPLANT
GLOVE BIOGEL PI INDICATOR 7.0 (GLOVE) ×2
GLOVE BIOGEL PI INDICATOR 7.5 (GLOVE) ×2
GLOVE BIOGEL PI INDICATOR 8.5 (GLOVE) ×2
GOWN SPEC L3 XXLG W/TWL (GOWN DISPOSABLE) ×3 IMPLANT
GOWN STRL REUS W/TWL XL LVL3 (GOWN DISPOSABLE) ×3 IMPLANT
HANDPIECE INTERPULSE COAX TIP (DISPOSABLE) ×2
HOLDER FOLEY CATH W/STRAP (MISCELLANEOUS) IMPLANT
HOOD PEEL AWAY FLYTE STAYCOOL (MISCELLANEOUS) ×12 IMPLANT
INSERT TRIATH CS X3 SZ5 14 (Insert) ×3 IMPLANT
JET LAVAGE IRRISEPT WOUND (IRRIGATION / IRRIGATOR) ×3
KIT BONE PREP STRYKER (KITS) ×1
KIT TURNOVER KIT A (KITS) IMPLANT
LAVAGE JET IRRISEPT WOUND (IRRIGATION / IRRIGATOR) ×1 IMPLANT
MANIFOLD NEPTUNE II (INSTRUMENTS) ×3 IMPLANT
MARKER SKIN DUAL TIP RULER LAB (MISCELLANEOUS) ×3 IMPLANT
NEEDLE SPNL 18GX3.5 QUINCKE PK (NEEDLE) ×3 IMPLANT
NS IRRIG 1000ML POUR BTL (IV SOLUTION) ×3 IMPLANT
OSTEOTOME THIN 10.0 3 (INSTRUMENTS) ×3 IMPLANT
OSTEOTOME U 15.5X8 (ORTHOPEDIC DISPOSABLE SUPPLIES) ×3 IMPLANT
PACK TOTAL KNEE CUSTOM (KITS) ×3 IMPLANT
PADDING CAST COTTON 6X4 STRL (CAST SUPPLIES) ×6 IMPLANT
PENCIL SMOKE EVACUATOR (MISCELLANEOUS) IMPLANT
PROTECTOR NERVE ULNAR (MISCELLANEOUS) ×3 IMPLANT
SAW OSC TIP CART 19.5X105X1.3 (SAW) ×3 IMPLANT
SEALER BIPOLAR AQUA 6.0 (INSTRUMENTS) ×3 IMPLANT
SET HNDPC FAN SPRY TIP SCT (DISPOSABLE) ×1 IMPLANT
SET PAD KNEE POSITIONER (MISCELLANEOUS) ×3 IMPLANT
SLEEVE SUCTION 125 (MISCELLANEOUS) ×3 IMPLANT
SPONGE DRAIN TRACH 4X4 STRL 2S (GAUZE/BANDAGES/DRESSINGS) ×3 IMPLANT
SPONGE LAP 18X18 RF (DISPOSABLE) IMPLANT
STEM FEM TRIA 17X100 (Stem) ×3 IMPLANT
SUT MNCRL AB 3-0 PS2 18 (SUTURE) ×3 IMPLANT
SUT MON AB 2-0 CT1 36 (SUTURE) ×6 IMPLANT
SUT STRATAFIX PDO 1 14 VIOLET (SUTURE) ×2
SUT STRATFX PDO 1 14 VIOLET (SUTURE) ×1
SUT VIC AB 1 CTX 36 (SUTURE) ×4
SUT VIC AB 1 CTX36XBRD ANBCTR (SUTURE) ×2 IMPLANT
SUT VIC AB 2-0 CT1 27 (SUTURE) ×2
SUT VIC AB 2-0 CT1 TAPERPNT 27 (SUTURE) ×1 IMPLANT
SUTURE STRATFX PDO 1 14 VIOLET (SUTURE) ×1 IMPLANT
TIBIAL AUGMENT HALF BLOCK 10MM (Knees) ×3 IMPLANT
TOWER CARTRIDGE SMART MIX (DISPOSABLE) ×6 IMPLANT
TRAY FOLEY MTR SLVR 16FR STAT (SET/KITS/TRAYS/PACK) ×3 IMPLANT
WATER STERILE IRR 1000ML POUR (IV SOLUTION) ×3 IMPLANT
WRAP KNEE MAXI GEL POST OP (GAUZE/BANDAGES/DRESSINGS) ×3 IMPLANT
YANKAUER SUCT BULB TIP 10FT TU (MISCELLANEOUS) ×3 IMPLANT

## 2019-11-07 NOTE — Anesthesia Procedure Notes (Signed)
Spinal  Patient location during procedure: OR Start time: 11/07/2019 10:59 AM End time: 11/07/2019 11:04 AM Staffing Performed: resident/CRNA  Anesthesiologist: Janeece Riggers, MD Resident/CRNA: Montel Clock, CRNA Preanesthetic Checklist Completed: patient identified, IV checked, risks and benefits discussed, surgical consent, monitors and equipment checked, pre-op evaluation and timeout performed Spinal Block Patient position: sitting Prep: DuraPrep Patient monitoring: heart rate, cardiac monitor, continuous pulse ox and blood pressure Approach: midline Location: L3-4 Injection technique: single-shot Needle Needle type: Pencan  Needle gauge: 24 G Needle length: 10 cm Needle insertion depth: 8 cm Assessment Sensory level: T6

## 2019-11-07 NOTE — Interval H&P Note (Signed)
History and Physical Interval Note:  11/07/2019 10:47 AM  Diana Oneill  has presented today for surgery, with the diagnosis of Failed left total knee arthroplasty.  The various methods of treatment have been discussed with the patient and family. After consideration of risks, benefits and other options for treatment, the patient has consented to  Procedure(s): TOTAL KNEE REVISION (Left) as a surgical intervention.  The patient's history has been reviewed, patient examined, no change in status, stable for surgery.  I have reviewed the patient's chart and labs.  Questions were answered to the patient's satisfaction.    The risks, benefits, and alternatives were discussed with the patient. There are risks associated with the surgery including, but not limited to, problems with anesthesia (death), infection, instability (giving out of the joint), dislocation, differences in leg length/angulation/rotation, fracture of bones, loosening or failure of implants, hematoma (blood accumulation) which may require surgical drainage, blood clots, pulmonary embolism, nerve injury (foot drop and lateral thigh numbness), and blood vessel injury. The patient understands these risks and elects to proceed.  UDS (+) for opiates - takes oxy 5 for chronic pain   Hilton Cork [place]

## 2019-11-07 NOTE — Progress Notes (Signed)
Assisted Dr. Oddono with left, ultrasound guided, adductor canal block. Side rails up, monitors on throughout procedure. See vital signs in flow sheet. Tolerated Procedure well.  

## 2019-11-07 NOTE — Anesthesia Postprocedure Evaluation (Signed)
Anesthesia Post Note  Patient: Diana Oneill  Procedure(s) Performed: TOTAL KNEE REVISION (Left Knee)     Patient location during evaluation: PACU Anesthesia Type: Spinal Level of consciousness: oriented and awake and alert Pain management: pain level controlled Vital Signs Assessment: post-procedure vital signs reviewed and stable Respiratory status: spontaneous breathing, respiratory function stable and patient connected to nasal cannula oxygen Cardiovascular status: blood pressure returned to baseline and stable Postop Assessment: no headache, no backache and no apparent nausea or vomiting Anesthetic complications: no   No complications documented.  Last Vitals:  Vitals:   11/07/19 1449 11/07/19 1545  BP: (!) 99/57 102/60  Pulse: (!) 57 (!) 49  Resp: 11 13  Temp: 36.7 C   SpO2: 100% 93%    Last Pain:  Vitals:   11/07/19 1515  TempSrc:   PainSc: 0-No pain                 Nevena Rozenberg

## 2019-11-07 NOTE — Discharge Instructions (Signed)
Dr. Rod Can Total Joint Specialist [facility] [address], [place], [place] [address] [phone]  TOTAL KNEE REPLACEMENT POSTOPERATIVE DIRECTIONS    Knee Rehabilitation, Guidelines Following Surgery  Results after knee surgery are often greatly improved when you follow the exercise, range of motion and muscle strengthening exercises prescribed by your doctor. Safety measures are also important to protect the knee from further injury. Any time any of these exercises cause you to have increased pain or swelling in your knee joint, decrease the amount until you are comfortable again and slowly increase them. If you have problems or questions, call your caregiver or physical therapist for advice.   WEIGHT BEARING Partial weight bearing with assist device as directed.  touch down weight bearing left leg  HOME CARE INSTRUCTIONS  Remove items at home which could result in a fall. This includes throw rugs or furniture in walking pathways.  Continue medications as instructed at time of discharge. You may have some home medications which will be placed on hold until you complete the course of blood thinner medication.  You may start showering once you are discharged home but do not submerge the incision under water. Just pat the incision dry and apply a dry gauze dressing on daily. Walk with walker as instructed.  You may resume a sexual relationship in one month or when given the OK by your doctor.   Use walker as long as suggested by your caregivers.  Avoid periods of inactivity such as sitting longer than an hour when not asleep. This helps prevent blood clots.  You may put full weight on your legs and walk as much as is comfortable.  You may return to work once you are cleared by your doctor.  Do not drive a car for 6 weeks or until released by you surgeon.   Do not drive while taking narcotics.  Wear the elastic stockings for three weeks following  surgery during the day but you may remove then at night. Make sure you keep all of your appointments after your operation with all of your doctors and caregivers. You should call the office at the above phone number and make an appointment for approximately two weeks after the date of your surgery. Do not remove your surgical dressing. The dressing is waterproof; you may take showers in 3 days, but do not take tub baths or submerge the dressing. Please pick up a stool softener and laxative for home use as long as you are requiring pain medications.  ICE to the affected knee every three hours for 30 minutes at a time and then as needed for pain and swelling.  Continue to use ice on the knee for pain and swelling from surgery. You may notice swelling that will progress down to the foot and ankle.  This is normal after surgery.  Elevate the leg when you are not up walking on it.   It is important for you to complete the blood thinner medication as prescribed by your doctor.  Continue to use the breathing machine which will help keep your temperature down.  It is common for your temperature to cycle up and down following surgery, especially at night when you are not up moving around and exerting yourself.  The breathing machine keeps your lungs expanded and your temperature down.  RANGE OF MOTION AND STRENGTHENING EXERCISES  Rehabilitation of the knee is important following a knee injury or an operation. After just a few days of immobilization,  the muscles of the thigh which control the knee become weakened and shrink (atrophy). Knee exercises are designed to build up the tone and strength of the thigh muscles and to improve knee motion. Often times heat used for twenty to thirty minutes before working out will loosen up your tissues and help with improving the range of motion but do not use heat for the first two weeks following surgery. These exercises can be done on a training (exercise) mat, on the floor,  on a table or on a bed. Use what ever works the best and is most comfortable for you Knee exercises include:  Leg Lifts - While your knee is still immobilized in a splint or cast, you can do straight leg raises. Lift the leg to 60 degrees, hold for 3 sec, and slowly lower the leg. Repeat 10-20 times 2-3 times daily. Perform this exercise against resistance later as your knee gets better.  Quad and Hamstring Sets - Tighten up the muscle on the front of the thigh (Quad) and hold for 5-10 sec. Repeat this 10-20 times hourly. Hamstring sets are done by pushing the foot backward against an object and holding for 5-10 sec. Repeat as with quad sets.  A rehabilitation program following serious knee injuries can speed recovery and prevent re-injury in the future due to weakened muscles. Contact your doctor or a physical therapist for more information on knee rehabilitation.   SKILLED REHAB INSTRUCTIONS: If the patient is transferred to a skilled rehab facility following release from the hospital, a list of the current medications will be sent to the facility for the patient to continue.  When discharged from the skilled rehab facility, please have the facility set up the patient's [place] prior to being released. Also, the skilled facility will be responsible for providing the patient with their medications at time of release from the facility to include their pain medication, the muscle relaxants, and their blood thinner medication. If the patient is still at the rehab facility at time of the two week follow up appointment, the skilled rehab facility will also need to assist the patient in arranging follow up appointment in our office and any transportation needs.  MAKE SURE YOU:  Understand these instructions.  Will watch your condition.  Will get help right away if you are not doing well or get worse.    Pick up stool softner and laxative for home use following surgery while on pain  medications. Do NOT remove your dressing. You may shower.  Do not take tub baths or submerge incision under water. May shower starting three days after surgery. Please use a clean towel to pat the incision dry following showers. Continue to use ice for pain and swelling after surgery. Do not use any lotions or creams on the incision until instructed by your surgeon.     Information on my medicine - ELIQUIS (apixaban)  Why was Eliquis prescribed for you? Eliquis was prescribed for you to reduce the risk of blood clots forming after orthopedic surgery.    What do You need to know about Eliquis? Take your Eliquis TWICE DAILY - one tablet in the morning and one tablet in the evening with or without food.  It would be best to take the dose about the same time each day.  If you have difficulty swallowing the tablet whole please discuss with your pharmacist how to take the medication safely.  Take Eliquis exactly as prescribed by your doctor and  DO NOT stop taking Eliquis without talking to the doctor who prescribed the medication.  Stopping without other medication to take the place of Eliquis may increase your risk of developing a clot.  After discharge, you should have regular check-up appointments with your healthcare provider that is prescribing your Eliquis.  What do you do if you miss a dose? If a dose of ELIQUIS is not taken at the scheduled time, take it as soon as possible on the same day and twice-daily administration should be resumed.  The dose should not be doubled to make up for a missed dose.  Do not take more than one tablet of ELIQUIS at the same time.  Important Safety Information A possible side effect of Eliquis is bleeding. You should call your healthcare provider right away if you experience any of the following: ? Bleeding from an injury or your nose that does not stop. ? Unusual colored urine (red or dark brown) or unusual colored stools (red or  black). ? Unusual bruising for unknown reasons. ? A serious fall or if you hit your head (even if there is no bleeding).  Some medicines may interact with Eliquis and might increase your risk of bleeding or clotting while on Eliquis. To help avoid this, consult your healthcare provider or pharmacist prior to using any new prescription or non-prescription medications, including herbals, vitamins, non-steroidal anti-inflammatory drugs (NSAIDs) and supplements.  This website has more information on Eliquis (apixaban): http://[email or web address].com/eliquis/home

## 2019-11-07 NOTE — Evaluation (Signed)
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  Pt would benefit from follow up rehab at SNF level to maximize IND and safety prior to return home with limited assist.    Follow Up Recommendations SNF    Equipment Recommendations  None recommended by PT    Recommendations for Other Services       Precautions / Restrictions Precautions Precautions: Knee;Fall Restrictions Weight Bearing Restrictions: Yes LLE Weight Bearing: Touchdown weight bearing      Mobility  Bed Mobility Overal bed mobility: Needs Assistance Bed Mobility: Supine to Sit     Supine to sit: Min assist     General bed mobility comments: cues for sequence and use of R LE to self assist;  Physical assist to manage L LE.  Pt able to side scoot up to top of bed with UEs and R LE - min assist to insure min WB on L LE  Transfers Overall transfer level: Needs assistance               General transfer comment: deferred at pt request - pt states her husband will bring in R ankle brace  Ambulation/Gait                Stairs            Wheelchair Mobility    Modified Rankin (Stroke Patients Only)       Balance Overall balance assessment: Needs assistance Sitting-balance support: No upper extremity supported Sitting balance-Leahy Scale: Good                                       Pertinent Vitals/Pain Pain Assessment: 0-10 Pain Score: 5  Pain Location: L knee Pain Descriptors / Indicators: Aching;Throbbing Pain Intervention(s): Limited activity within patient's  tolerance;Monitored during session;Patient requesting pain meds-RN notified    Home Living Family/patient expects to be discharged to:: Skilled nursing facility Living Arrangements: Spouse/significant other                    Prior Function Level of Independence: Needs assistance   Gait / Transfers Assistance Needed: walking with cane  ADL's / Homemaking Assistance Needed: assist of spouse as needed        Hand Dominance   Dominant Hand: Right    Extremity/Trunk Assessment   Upper Extremity Assessment Upper Extremity Assessment: Overall WFL for tasks assessed    Lower Extremity Assessment Lower Extremity Assessment: LLE deficits/detail;RLE deficits/detail RLE Deficits / Details: Pt states wears a brace since ankle fx     Cervical / Trunk Assessment Cervical / Trunk Assessment: Normal  Communication   Communication: No difficulties  Cognition Arousal/Alertness: Awake/alert Behavior During Therapy: WFL for tasks assessed/performed Overall Cognitive Status: Within Functional Limits for tasks assessed  General Comments      Exercises     Assessment/Plan    PT Assessment Patient needs continued PT services  PT Problem List Decreased strength;Decreased range of motion;Decreased activity tolerance;Decreased balance;Decreased mobility;Decreased knowledge of use of DME;Obesity;Pain;Decreased knowledge of precautions       PT Treatment Interventions DME instruction;Gait training;Functional mobility training;Therapeutic activities;Therapeutic exercise;Balance training;Patient/family education    PT Goals (Current goals can be found in the Care Plan section)  Acute Rehab PT Goals Patient Stated Goal: Regain IND PT Goal Formulation: With patient Time For Goal Achievement: 11/14/19 Potential to Achieve Goals: Fair    Frequency Min 6X/week   Barriers to discharge Decreased caregiver support Spouse works  12 hrs/day and 6 days/week    Co-evaluation               AM-PAC PT "6 Clicks" Mobility  Outcome Measure Help needed turning from your back to your side while in a flat bed without using bedrails?: A Little Help needed moving from lying on your back to sitting on the side of a flat bed without using bedrails?: A Little Help needed moving to and from a bed to a chair (including a wheelchair)?: A Lot Help needed standing up from a chair using your arms (e.g., wheelchair or bedside chair)?: A Lot Help needed to walk in hospital room?: A Lot Help needed climbing 3-5 steps with a railing? : Total 6 Click Score: 13    End of Session   Activity Tolerance: Patient tolerated treatment well Patient left: Other (comment) (sitting EOB for dinner) Nurse Communication: Mobility status PT Visit Diagnosis: Difficulty in walking, not elsewhere classified (R26.2)    Time: 2595-6387 PT Time Calculation (min) (ACUTE ONLY): 22 min   Charges:   PT Evaluation $PT Eval Low Complexity: [facility] Pager [phone] Office [phone]   Aquilla Voiles 11/07/2019, 6:45 PM

## 2019-11-07 NOTE — Op Note (Signed)
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   POSTOPERATIVE DIAGNOSIS: Failed Left total knee arthroplasty.   PROCEDURE: Revision Left total knee arthroplasty, entire tibial component.   IMPLANTS: Stryker universal tibia, size 5, with 5 mm lateral augment, 10 mm medial augment, and 17 x 100 mm fluted stem. Tritanium tibial symmetric cone, size C. X3 polyethelyene insert, size 14 mm, CS. Simplex P bone cement.  EXPLANTS: Stryker Universal tibia, size 5. X3 polyethelyene insert, size 9 mm, CS.  ANESTHESIA:  Spinal  TOURNIQUET TIME: 57 min at 378mm Hg.  ESTIMATED BLOOD LOSS:-300 mL    ANTIBIOTICS: 1 g Vancomycin.  SPECIMENS: Tibial bone swab for culture.  DRAINS: None.  COMPLICATIONS: None   CONDITION: PACU - hemodynamically stable.   BRIEF CLINICAL NOTE: Diana Oneill is a 64 y.o. female who underwent primary left total knee replacement by myself in 2018.  She had a hybrid total knee replacement with a press-fit femur, press-fit patella, and cemented tibia.  The tibial component was cemented at the original time of surgery due to poor bone quality.  Patient presented to the office with progressive pain over the left tibial component.  Bone scan demonstrated loosening of the tibia.  Inflammatory markers were negative, thus ruling out infection.  She was indicated for revision total knee arthroplasty. The risks, benefits, and alternatives to the procedure were explained, and the patient elected to proceed.  PROCEDURE IN DETAIL: The surgical site was marked by myself in the preop holding area.  Abductor canal block was placed by anesthesia in the pre-op holding area. Once inside the operative room, a foley catheter was inserted. The patient was then positioned, a nonsterile tourniquet was placed, and the lower extremity was prepped and draped in the  normal sterile surgical fashion. A time-out was called verifying side and site of surgery. The patient received IV antibiotics within 60 minutes of beginning the procedure.   The surgical exposure was performed with the tourniquet down.  Her previous anterior knee scar was excised with a 10 blade.  Standard anterior approach to the knee was performed utilizing a medial parapatellar arthrotomy.  Joint fluid was noted to be clear and normal appearing.  A medial release was performed and the infrapatellar scar was excised.  Radical synovectomy of the medial and lateral gutters was performed.  There was no significant synovial irritation.  The femoral component was well fixed.  The patellar component was well fixed.  The polyethylene liner was removed using an osteotome.  Examination of the tibial component ensued.  There was obvious subsidence of the component into varus alignment.  There was some micromotion of the tibial tray.  Using a saw with an ACL blade followed by flexible osteotomes, the interface between the implant and the cement was loosened.  The knee was put into flexion, and I was able to easily remove the tibial component without any significant bone loss.  I obtained a culture swab of the proximal tibia bone.  The quality of the proximal tibial bone was very poor.  Entry reamer was used to gain access to the tibial canal.  I sequentially reamed up to a 17 mm reamer with excellent cortical chatter.  Using cutting guide, I performed a cleanup tibial cut using the intramedullary canal as a reference.  Skim cut was made off the lateral side of the tibial plateau.  We had a defect medially  due to this change in alignment.  Due to poor bone quality, I elected to use a tibial cone.  I reamed for a size C tibial cone.  I plan for a size 5 tibial tray.  I planned for a 5 mm lateral augment, and a 10 mm medial augment.  The trial was assembled on the back table.  The trial was placed, and I was able to  evaluate the flexion and extension gaps, which were equivalent.  The trial components were then removed.  The tourniquet was inflated to 340 mmHg.  Cut bony surface was irrigated with pulse lavage.  Bony surface was dried.  The real tibial implant was opened and assembled on the back table.  The cone was impacted into place in the proximal tibia.  Cement was mixed.  The tibial component was then cemented into the cone using a hybrid fixation technique.  Trial polyliner was placed, and the knee was brought into extension while the cement polymerized.  Excess cement was cleared.  Once the cement was fully hardened, the trial liner was exchanged for the real liner. The knee was stable to varus and valgus stress through a full range of motion. The patella tracked centrally, and the PCL was well balanced  The wound was copiously irrigated with irrisept solution followed by normal saline with pulse lavage. Marcaine solution was injected into the periarticular soft tissue. 1 g of vancomycin powder was placed into the knee joint around the implant.  The wound was closed in layers using #1 Vicryl and strata fix for the fascia, 2-0 Vicryl for the subcutaneous fat, 2-0 Monocryl for the deep dermal layer, and staples plus Dermabond for the skin. Once the glue was fully dried, an Aquacell Ag and compressive dressing were applied. The tourniquet was let down, and the patient was transported to the recovery room in stable ondition. Sponge, needle, and instrument counts were correct at the end of the case x2. The patient tolerated the procedure well and there were no known complications.  Please note that a surgical assistant was a medical necessity for this procedure in order to perform it in a safe and expeditious manner. Surgical assistant was necessary to retract the ligaments and vital neurovascular structures to prevent injury to them and also necessary for proper positioning of the limb to allow for anatomic  placement of the prosthesis.  POSTOPERATIVE PLAN: Patient be admitted to the hospital.  Touchdown weightbearing left lower extremity with a walker.  Mobilize out of bed with physical and occupational therapy.  Beginning tomorrow morning, start apixaban for DVT prophylaxis.  Routine prophylactic antibiotics.  Follow intraoperative culture.  Due to weightbearing restriction, she will most likely require skilled nursing placement.

## 2019-11-07 NOTE — Transfer of Care (Signed)
Immediate Anesthesia Transfer of Care Note  Patient: Avnoor Koury  Procedure(s) Performed: TOTAL KNEE REVISION (Left Knee)  Patient Location: PACU  Anesthesia Type:Spinal  Level of Consciousness: awake and patient cooperative  Airway & Oxygen Therapy: Patient Spontanous Breathing and Patient connected to face mask oxygen  Post-op Assessment: Report given to RN and Post -op Vital signs reviewed and stable  Post vital signs: Reviewed and stable  Last Vitals:  Vitals Value Taken Time  BP 99/57 11/07/19 1449  Temp    Pulse 58 11/07/19 1452  Resp 18 11/07/19 1452  SpO2 100 % 11/07/19 1452  Vitals shown include unvalidated device data.  Last Pain:  Vitals:   11/07/19 0939  TempSrc:   PainSc: 0-No pain         Complications: No complications documented.

## 2019-11-07 NOTE — Anesthesia Procedure Notes (Signed)
Anesthesia Regional Block: Adductor canal block   Pre-Anesthetic Checklist: ,, timeout performed, Correct Patient, Correct Site, Correct Laterality, Correct Procedure, Correct Position, site marked, Risks and benefits discussed,  Surgical consent,  Pre-op evaluation,  At surgeon's request and post-op pain management  Laterality: Left  Prep: chloraprep       Needles:  Injection technique: Single-shot  Needle Type: Echogenic Stimulator Needle     Needle Length: 5cm  Needle Gauge: 22     Additional Needles:   Procedures:, nerve stimulator,,, ultrasound used (permanent image in chart),,,,  Narrative:  Start time: 11/07/2019 9:29 AM End time: 11/07/2019 9:33 AM Injection made incrementally with aspirations every 5 mL.  Performed by: Personally  Anesthesiologist: Janeece Riggers, MD  Additional Notes: Functioning IV was confirmed and monitors were applied.  A 75mm 22ga Arrow echogenic stimulator needle was used. Sterile prep and drape,hand hygiene and sterile gloves were used. Ultrasound guidance: relevant anatomy identified, needle position confirmed, local anesthetic spread visualized around nerve(s)., vascular puncture avoided.  Image printed for medical record. Negative aspiration and negative test dose prior to incremental administration of local anesthetic. The patient tolerated the procedure well.

## 2019-11-08 LAB — BASIC METABOLIC PANEL
Anion gap: 8 (ref 5–15)
BUN: 41 mg/dL — ABNORMAL HIGH (ref 8–23)
CO2: 21 mmol/L — ABNORMAL LOW (ref 22–32)
Calcium: 8.2 mg/dL — ABNORMAL LOW (ref 8.9–10.3)
Chloride: 104 mmol/L (ref 98–111)
Creatinine, Ser: 1.38 mg/dL — ABNORMAL HIGH (ref 0.44–1.00)
GFR calc Af Amer: 47 mL/min — ABNORMAL LOW (ref >60)
GFR calc non Af Amer: 41 mL/min — ABNORMAL LOW (ref >60)
Glucose, Bld: 157 mg/dL — ABNORMAL HIGH (ref 70–99)
Potassium: 5.2 mmol/L — ABNORMAL HIGH (ref 3.5–5.1)
Sodium: 133 mmol/L — ABNORMAL LOW (ref 135–145)

## 2019-11-08 LAB — CBC
HCT: 27.3 % — ABNORMAL LOW (ref 36.0–46.0)
Hemoglobin: 8.7 g/dL — ABNORMAL LOW (ref 12.0–15.0)
MCH: 28.1 pg (ref 26.0–34.0)
MCHC: 31.9 g/dL (ref 30.0–36.0)
MCV: 88.1 fL (ref 80.0–100.0)
Platelets: 133 10*3/uL — ABNORMAL LOW (ref 150–400)
RBC: 3.1 MIL/uL — ABNORMAL LOW (ref 3.87–5.11)
RDW: 16.7 % — ABNORMAL HIGH (ref 11.5–15.5)
WBC: 7.2 10*3/uL (ref 4.0–10.5)
nRBC: 0 % (ref 0.0–0.2)

## 2019-11-08 MED ORDER — FLUTICASONE FUROATE-VILANTEROL 100-25 MCG/INH IN AEPB
1.0000 | INHALATION_SPRAY | Freq: Every day | RESPIRATORY_TRACT | Status: DC
  Filled 2019-11-08: qty 28

## 2019-11-08 MED ORDER — UMECLIDINIUM BROMIDE 62.5 MCG/INH IN AEPB
1.0000 | INHALATION_SPRAY | Freq: Every day | RESPIRATORY_TRACT | Status: DC
  Filled 2019-11-08: qty 7

## 2019-11-08 NOTE — NC FL2 (Signed)
[place] LEVEL OF CARE SCREENING TOOL     IDENTIFICATION  Patient Name: Diana Oneill Birthdate: [birth year] Sex: female Admission Date (Current Location): 11/07/2019  [facility] and [place] Number:  Herbalist and Address:  [facility],  [place] [address], [place]      Provider Number: [id number]  Attending Physician Name and Address:  Rod Can, MD  Relative Name and Phone Number:  Cyenna Rebello, husband, [phone]    Current Level of Care: Hospital Recommended Level of Care: [place] Prior Approval Number:    Date Approved/Denied:   PASRR Number: pending  Discharge Plan: SNF    Current Diagnoses: Patient Active Problem List   Diagnosis Date Noted  . Failed total knee, left ([place]) 11/07/2019  . S/P revision of total knee, left 11/07/2019  . Morbid (severe) obesity with alveolar hypoventilation ([place]) 09/12/2019  . Coronavirus infection 04/21/2019  . Preoperative cardiovascular examination 04/18/2019  . Overweight 04/18/2019  . Cigarette smoker 04/18/2019  . Chronic knee pain after total replacement of left knee joint 03/05/2019  . Ankle pain 07/10/2017  . Closed displaced trimalleolar fracture of right ankle 01/05/2017  . Acute encephalopathy 10/25/2016  . Essential hypertension 10/25/2016  . Obesity 10/25/2016  . Acidosis 10/25/2016  . Nausea without vomiting 10/25/2016  . Tachycardia 10/25/2016  . COPD (chronic obstructive pulmonary disease) ([place]) 10/25/2016  . Acute respiratory failure ([place]) 10/25/2016  . Hyponatremia   . Acute kidney injury ([place])   . Anemia   . Thrombocytopenia ([place])   . Osteoarthritis of left knee 10/23/2016  . Bipolar disorder, now depressed ([place]) 08/10/2014  . Cocaine use disorder, mild, in early remission ([place]) 08/10/2014    Orientation RESPIRATION BLADDER Height & Weight     Self, Time, Situation, Place  Normal External catheter Weight: 93.4  kg Height:  5\' 5"  (165.1 cm)  BEHAVIORAL SYMPTOMS/MOOD NEUROLOGICAL BOWEL NUTRITION STATUS   (none)  (none) Continent Diet (see d/c summary)  AMBULATORY STATUS COMMUNICATION OF NEEDS Skin   Extensive Assist Verbally Surgical wounds (L knee)                       Personal Care Assistance Level of Assistance  Bathing, Feeding, Dressing Bathing Assistance: Maximum assistance Feeding assistance: Independent Dressing Assistance: Limited assistance     Functional Limitations Info  Sight, Hearing, Speech Sight Info: Adequate Hearing Info: Adequate Speech Info: Adequate    SPECIAL CARE FACTORS FREQUENCY  PT (By licensed PT)     PT Frequency: 5-7X/W              Contractures Contractures Info: Not present    Additional Factors Info  Code Status, Allergies Code Status Info: full Allergies Info: Penicillins, Prozac           Current Medications (11/08/2019):  This is the current hospital active medication list Current Facility-Administered Medications  Medication Dose Route Frequency Provider Last Rate Last Admin  . 0.9 %  sodium chloride infusion   Intravenous Continuous Rod Can, MD 150 mL/hr at 11/08/19 0945 New Bag at 11/08/19 0945  . acetaminophen (TYLENOL) tablet 325-650 mg  325-650 mg Oral Q6H PRN Swinteck, Aaron Edelman, MD      . alum & mag hydroxide-simeth (MAALOX/MYLANTA) 200-200-20 MG/5ML suspension 30 mL  30 mL Oral Q4H PRN Swinteck, Aaron Edelman, MD      . apixaban (ELIQUIS) tablet 2.5 mg  2.5 mg Oral Q12H Rod Can, MD   2.5 mg  at 11/08/19 9381  . ascorbic acid (VITAMIN C) tablet 1,000 mg  1,000 mg Oral Daily Rod Can, MD   1,000 mg at 11/08/19 0946  . azelastine (ASTELIN) 0.1 % nasal spray 2 spray  2 spray Each Nare BID PRN Rod Can, MD      . diphenhydrAMINE (BENADRYL) 12.5 MG/5ML elixir 12.5-25 mg  12.5-25 mg Oral Q4H PRN Swinteck, Aaron Edelman, MD      . docusate sodium (COLACE) capsule 100 mg  100 mg Oral BID Rod Can, MD   100 mg at  11/08/19 0946  . fluticasone (FLONASE) 50 MCG/ACT nasal spray 2 spray  2 spray Each Nare BID PRN Swinteck, Aaron Edelman, MD      . fluticasone furoate-vilanterol (BREO ELLIPTA) 100-25 MCG/INH 1 puff  1 puff Inhalation Daily Swinteck, Aaron Edelman, MD       And  . umeclidinium bromide (INCRUSE ELLIPTA) 62.5 MCG/INH 1 puff  1 puff Inhalation Daily Swinteck, Brian, MD      . furosemide (LASIX) tablet 40 mg  40 mg Oral Daily Rod Can, MD   40 mg at 11/08/19 0946  . gabapentin (NEURONTIN) capsule 600 mg  600 mg Oral QID Rod Can, MD   600 mg at 11/08/19 1409  . HYDROmorphone (DILAUDID) injection 0.5-1 mg  0.5-1 mg Intravenous Q4H PRN Rod Can, MD   1 mg at 11/08/19 1101  . ipratropium-albuterol (DUONEB) 0.5-2.5 (3) MG/3ML nebulizer solution 3 mL  3 mL Nebulization Q6H PRN Swinteck, Aaron Edelman, MD      . menthol-cetylpyridinium (CEPACOL) lozenge 3 mg  1 lozenge Oral PRN Swinteck, Aaron Edelman, MD       Or  . phenol (CHLORASEPTIC) mouth spray 1 spray  1 spray Mouth/Throat PRN Swinteck, Aaron Edelman, MD      . methocarbamol (ROBAXIN) tablet 500 mg  500 mg Oral Q6H PRN Rod Can, MD   500 mg at 11/08/19 0418   Or  . methocarbamol (ROBAXIN) 500 mg in dextrose 5 % 50 mL IVPB  500 mg Intravenous Q6H PRN Swinteck, Aaron Edelman, MD      . metoCLOPramide (REGLAN) tablet 5 mg  5 mg Oral Q8H PRN Swinteck, Aaron Edelman, MD       Or  . metoCLOPramide (REGLAN) injection 5 mg  5 mg Intravenous Q8H PRN Swinteck, Aaron Edelman, MD      . montelukast (SINGULAIR) tablet 10 mg  10 mg Oral Daily Rod Can, MD   10 mg at 11/08/19 0946  . ondansetron (ZOFRAN) tablet 4 mg  4 mg Oral Q6H PRN Swinteck, Aaron Edelman, MD       Or  . ondansetron (ZOFRAN) injection 4 mg  4 mg Intravenous Q6H PRN Swinteck, Aaron Edelman, MD      . oxyCODONE (Oxy IR/ROXICODONE) immediate release tablet 10-15 mg  10-15 mg Oral Q4H PRN Rod Can, MD   10 mg at 11/08/19 1409  . oxyCODONE (Oxy IR/ROXICODONE) immediate release tablet 5-10 mg  5-10 mg Oral Q4H PRN Rod Can, MD    10 mg at 11/08/19 0418  . polyethylene glycol (MIRALAX / GLYCOLAX) packet 17 g  17 g Oral Daily PRN Rod Can, MD         Discharge Medications: Please see discharge summary for a list of discharge medications.  Relevant Imaging Results:  Relevant Lab Results:   Additional Information SSN [id number]  Trish Mage, [place]

## 2019-11-08 NOTE — Progress Notes (Signed)
Diana Oneill is a 64 y.o. female patient. 1. Failed total knee, left ([place])   2. S/P total knee replacement, left    Past Medical History:  Diagnosis Date  . Acidosis 10/25/2016  . Acute encephalopathy 10/25/2016  . Acute kidney injury ([place])   . Acute respiratory failure ([place]) 10/25/2016  . Anemia   . Ankle pain 07/10/2017  . Anxiety    no meds  . Arthritis   . Asthma   . Bipolar disorder, now depressed ([place]) 08/10/2014   not on meds at this time  . Cancer ([place])    skin cancer, uterine cancer  . CKD (chronic kidney disease), stage III   . Closed displaced trimalleolar fracture of right ankle 01/05/2017  . Cocaine use disorder, mild, in early remission ([place]) 08/10/2014  . COPD (chronic obstructive pulmonary disease) ([place])   . Depression   . Dyspnea    with exertion  . Essential hypertension 10/25/2016   takes meds as needed  . History of kidney stones   . Hypertension   . Hyponatremia   . Nausea without vomiting 10/25/2016  . Obese   . Obesity 10/25/2016  . Osteoarthritis   . Osteoarthritis of left knee 10/23/2016  . Sleep apnea    does not use CPAP "Makes too much Noise"  . Tachycardia 10/25/2016  . Thoracic aortic aneurysm ([place]) 09/2016   4.1 cm ascending thoracic aortic aneurysm.  . Thrombocytopenia ([place]) 09/2016   Current Facility-Administered Medications  Medication Dose Route Frequency Provider Last Rate Last Admin  . 0.9 %  sodium chloride infusion   Intravenous Continuous Rod Can, MD 150 mL/hr at 11/08/19 0300 Rate Verify at 11/08/19 0300  . acetaminophen (TYLENOL) tablet 325-650 mg  325-650 mg Oral Q6H PRN Swinteck, Aaron Edelman, MD      . alum & mag hydroxide-simeth (MAALOX/MYLANTA) 200-200-20 MG/5ML suspension 30 mL  30 mL Oral Q4H PRN Swinteck, Aaron Edelman, MD      . apixaban Arne [place]) tablet 2.5 mg  2.5 mg Oral Q12H Rod Can, MD   2.5 mg at 11/08/19 0017  . ascorbic acid (VITAMIN C) tablet 1,000 mg  1,000 mg Oral Daily Swinteck, Aaron Edelman, MD      .  azelastine (ASTELIN) 0.1 % nasal spray 2 spray  2 spray Each Nare BID PRN Rod Can, MD      . dexamethasone (DECADRON) injection 10 mg  10 mg Intravenous Once Rod Can, MD      . diphenhydrAMINE (BENADRYL) 12.5 MG/5ML elixir 12.5-25 mg  12.5-25 mg Oral Q4H PRN Swinteck, Aaron Edelman, MD      . docusate sodium (COLACE) capsule 100 mg  100 mg Oral BID Rod Can, MD   100 mg at 11/07/19 2151  . fluticasone (FLONASE) 50 MCG/ACT nasal spray 2 spray  2 spray Each Nare BID PRN Swinteck, Aaron Edelman, MD      . fluticasone furoate-vilanterol (BREO ELLIPTA) 100-25 MCG/INH 1 puff  1 puff Inhalation Daily Swinteck, Aaron Edelman, MD       And  . umeclidinium bromide (INCRUSE ELLIPTA) 62.5 MCG/INH 1 puff  1 puff Inhalation Daily Swinteck, Brian, MD      . furosemide (LASIX) tablet 40 mg  40 mg Oral Daily Rod Can, MD   40 mg at 11/07/19 1711  . gabapentin (NEURONTIN) capsule 600 mg  600 mg Oral QID Rod Can, MD   600 mg at 11/07/19 2151  . lisinopril (ZESTRIL) tablet 10 mg  10 mg Oral Daily Polly Cobia, [facility]  And  . hydrochlorothiazide (MICROZIDE) capsule 12.5 mg  12.5 mg Oral Daily Polly Cobia, RPH      . HYDROmorphone (DILAUDID) injection 0.5-1 mg  0.5-1 mg Intravenous Q4H PRN Rod Can, MD   1 mg at 11/08/19 0600  . ipratropium-albuterol (DUONEB) 0.5-2.5 (3) MG/3ML nebulizer solution 3 mL  3 mL Nebulization Q6H PRN Swinteck, Aaron Edelman, MD      . menthol-cetylpyridinium (CEPACOL) lozenge 3 mg  1 lozenge Oral PRN Swinteck, Aaron Edelman, MD       Or  . phenol (CHLORASEPTIC) mouth spray 1 spray  1 spray Mouth/Throat PRN Swinteck, Aaron Edelman, MD      . methocarbamol (ROBAXIN) tablet 500 mg  500 mg Oral Q6H PRN Rod Can, MD   500 mg at 11/08/19 0418   Or  . methocarbamol (ROBAXIN) 500 mg in dextrose 5 % 50 mL IVPB  500 mg Intravenous Q6H PRN Swinteck, Aaron Edelman, MD      . metoCLOPramide (REGLAN) tablet 5 mg  5 mg Oral Q8H PRN Swinteck, Aaron Edelman, MD       Or  . metoCLOPramide (REGLAN) injection  5 mg  5 mg Intravenous Q8H PRN Swinteck, Aaron Edelman, MD      . montelukast (SINGULAIR) tablet 10 mg  10 mg Oral Daily Rod Can, MD   10 mg at 11/07/19 1712  . ondansetron (ZOFRAN) tablet 4 mg  4 mg Oral Q6H PRN Swinteck, Aaron Edelman, MD       Or  . ondansetron (ZOFRAN) injection 4 mg  4 mg Intravenous Q6H PRN Swinteck, Aaron Edelman, MD      . oxyCODONE (Oxy IR/ROXICODONE) immediate release tablet 10-15 mg  10-15 mg Oral Q4H PRN Rod Can, MD   15 mg at 11/08/19 0012  . oxyCODONE (Oxy IR/ROXICODONE) immediate release tablet 5-10 mg  5-10 mg Oral Q4H PRN Rod Can, MD   10 mg at 11/08/19 0418  . polyethylene glycol (MIRALAX / GLYCOLAX) packet 17 g  17 g Oral Daily PRN Rod Can, MD       Allergies  Allergen Reactions  . Penicillins Anaphylaxis and Other (See Comments)    Has patient had a PCN reaction causing immediate rash, facial/tongue/throat swelling, SOB or lightheadedness with hypotension: Yes Has patient had a PCN reaction causing severe rash involving mucus membranes or skin necrosis: No Has patient had a PCN reaction that required hospitalization: No Has patient had a PCN reaction occurring within the last 10 years: No If all of the above answers are "NO", then may proceed with Cephalosporin use.  . Prozac [Fluoxetine Hcl] Hives, Swelling and Other (See Comments)    Facial swelling   Principal Problem:   Failed total knee, left (HCC) Active Problems:   S/P revision of total knee, left  Blood pressure 107/61, pulse (!) 58, temperature 97.6 F (36.4 C), temperature source Oral, resp. rate 16, height 5\' 5"  (1.651 m), weight 93.4 kg, SpO2 100 %.  Subjective: Symptoms:  Stable.   Diet:  Adequate intake.   Pain:  She complains of pain that is mild.  Pain is well controlled.    Objective: General Appearance:  Comfortable.   Vital signs: (most recent): Blood pressure 107/61, pulse (!) 58, temperature 97.6 F (36.4 C), temperature source Oral, resp. rate 16, height 5\' 5"   (1.651 m), weight 93.4 kg, SpO2 100 %.  Vital signs are normal.   Pulses: Distal pulses are intact.   Dressing intact No DVT Hgb 8.7 PLT 133 K 5.2 Na 133 Gm stain few wbc  Assessment & Plan   Asym Anemia K 5.2 Cr. 1.38 TTWB Awaiting SNF Repeat CBC, BMET in Am Check cultures. PT D/C to SNF when avail  Johnn Hai 11/08/2019

## 2019-11-08 NOTE — TOC Initial Note (Signed)
Transition of Care [facility]) - Initial/Assessment Note    Patient Details  Name: Diana Oneill MRN: [id number] Date of Birth: Sep 16, 1955  [facility]) CM/SW Contact:    Trish Mage, LCSW Phone Number: 11/08/2019, 3:01 PM  Clinical Narrative:  Patient seen in follow up to PT recommendation of SNF.  Diana Oneill lives in [place] with her husband of 16 years, has DME and has been to rehab previously.  She prefers to stay in [place] so her husband can visit easily, "but not [facility] re[facility]."  Other choice close by is [facility]. Bed search initiated. TOC will continue to follow during the course of hospitalization.                  Expected Discharge Plan: Skilled Nursing Facility Barriers to Discharge: SNF Pending bed offer   Patient Goals and CMS Choice Patient states their goals for this hospitalization and ongoing recovery are:: "I need to go to rehab-hopefully in [place] so my husband can visit." CMS Medicare.gov Compare Post Acute Care list provided to:: Patient Choice offered to / list presented to : Patient  Expected Discharge Plan and Services Expected Discharge Plan: [place]   Discharge Planning Services: CM Consult Post Acute Care Choice: [place] Living arrangements for the past 2 months: Single Family Home                                      Prior Living Arrangements/Services Living arrangements for the past 2 months: Single Family Home Lives with:: Spouse Patient language and need for interpreter reviewed:: Yes        Need for Family Participation in Patient Care: Yes (Comment) Care giver support system in place?: Yes (comment) Current home services: DME Criminal Activity/Legal Involvement Pertinent to Current Situation/Hospitalization: No - Comment as needed  Activities of Daily Living Home Assistive Devices/Equipment: Dentures (specify type), Eyeglasses, CPAP, Walker (specify type), Cane (specify quad  or straight), Bedside commode/3-in-1, Blood pressure cuff, Grab bars in shower ADL Screening (condition at time of admission) Patient's cognitive ability adequate to safely complete daily activities?: Yes Is the patient deaf or have difficulty hearing?: No Does the patient have difficulty seeing, even when wearing glasses/contacts?: No Does the patient have difficulty concentrating, remembering, or making decisions?: No Patient able to express need for assistance with ADLs?: Yes Does the patient have difficulty dressing or bathing?: No Independently performs ADLs?: Yes (appropriate for developmental age) Does the patient have difficulty walking or climbing stairs?: Yes Weakness of Legs: Left Weakness of Arms/Hands: Right  Permission Sought/Granted                  Emotional Assessment Appearance:: Appears older than stated age Attitude/Demeanor/Rapport: Engaged Affect (typically observed): Appropriate Orientation: : Oriented to Self, Oriented to Place, Oriented to  Time, Oriented to Situation Alcohol / Substance Use: Not Applicable Psych Involvement: No (comment)  Admission diagnosis:  S/P revision of total knee, left [Z96.652] Patient Active Problem List   Diagnosis Date Noted  . Failed total knee, left ([place]) 11/07/2019  . S/P revision of total knee, left 11/07/2019  . Morbid (severe) obesity with alveolar hypoventilation ([place]) 09/12/2019  . Coronavirus infection 04/21/2019  . Preoperative cardiovascular examination 04/18/2019  . Overweight 04/18/2019  . Cigarette smoker 04/18/2019  . Chronic knee pain after total replacement of left knee joint 03/05/2019  . Ankle pain 07/10/2017  .  Closed displaced trimalleolar fracture of right ankle 01/05/2017  . Acute encephalopathy 10/25/2016  . Essential hypertension 10/25/2016  . Obesity 10/25/2016  . Acidosis 10/25/2016  . Nausea without vomiting 10/25/2016  . Tachycardia 10/25/2016  . COPD (chronic obstructive pulmonary  disease) ([place]) 10/25/2016  . Acute respiratory failure ([place]) 10/25/2016  . Hyponatremia   . Acute kidney injury ([place])   . Anemia   . Thrombocytopenia ([place])   . Osteoarthritis of left knee 10/23/2016  . Bipolar disorder, now depressed ([place]) 08/10/2014  . Cocaine use disorder, mild, in early remission ([place]) 08/10/2014   PCP:  Marisue Humble, FNP Pharmacy:   [facility] [place], [address] [place] [address] Phone: [phone] Fax: [phone]     Social Determinants of Health (SDOH) Interventions    Readmission Risk Interventions No flowsheet data found.

## 2019-11-08 NOTE — Progress Notes (Signed)
Physical Therapy Treatment Patient Details Name: Diana Oneill MRN: [id number] DOB: Mar 02, 1956 Today's Date: 11/08/2019    History of Present Illness Pt s/p revision of L TKR and with hx of bil TKR, COPD, R ankle fx (18) and bipolar    PT Comments    Pt very cooperative and progressed to bed to chair stand pvt transfers - ambulation not attempted 2* pt requesting to wait until spouse brings her R ankle brace in this evening.   Follow Up Recommendations  SNF     Equipment Recommendations  None recommended by PT    Recommendations for Other Services       Precautions / Restrictions Precautions Precautions: Knee;Fall Restrictions Weight Bearing Restrictions: Yes LLE Weight Bearing: Touchdown weight bearing    Mobility  Bed Mobility Overal bed mobility: Needs Assistance Bed Mobility: Supine to Sit     Supine to sit: Min assist     General bed mobility comments: pt received and returned to chair  Transfers Overall transfer level: Needs assistance Equipment used: Rolling walker (2 wheeled) Transfers: Sit to/from Stand Sit to Stand: Mod assist Stand pivot transfers: Min assist;Mod assist       General transfer comment: heavy reliance on UEs, assist to rise and steady, pt able to maintain WB status with min assist  Ambulation/Gait                 Stairs             Wheelchair Mobility    Modified Rankin (Stroke Patients Only)       Balance Overall balance assessment: Needs assistance Sitting-balance support: No upper extremity supported Sitting balance-Leahy Scale: Good     Standing balance support: Bilateral upper extremity supported Standing balance-Leahy Scale: Poor                              Cognition Arousal/Alertness: Awake/alert Behavior During Therapy: WFL for tasks assessed/performed Overall Cognitive Status: Within Functional Limits for tasks assessed                                         Exercises Total Joint Exercises Ankle Circles/Pumps: AROM;Both;15 reps;Supine Quad Sets: AROM;Both;10 reps;Supine Heel Slides: AAROM;Left;15 reps;Supine Hip ABduction/ADduction: AAROM;AROM;Left;15 reps;Supine    General Comments        Pertinent Vitals/Pain Pain Assessment: 0-10 Pain Score: 5  Pain Location: L knee Pain Descriptors / Indicators: Aching;Throbbing Pain Intervention(s): Premedicated before session;Limited activity within patient's tolerance;Monitored during session;Ice applied    Home Living Family/patient expects to be discharged to:: Skilled nursing facility Living Arrangements: Spouse/significant other             Additional Comments: spouse works long hours    Prior Function Level of Independence: Needs assistance  Gait / Transfers Assistance Needed: walking with cane ADL's / Homemaking Assistance Needed: assist of spouse as needed with IADL     PT Goals (current goals can now be found in the care plan section) Acute Rehab PT Goals Patient Stated Goal: Regain IND PT Goal Formulation: With patient Time For Goal Achievement: 11/14/19 Potential to Achieve Goals: Fair Progress towards PT goals: Progressing toward goals    Frequency    Min 6X/week      PT Plan Current plan remains appropriate    Co-evaluation  AM-PAC PT "6 Clicks" Mobility   Outcome Measure  Help needed turning from your back to your side while in a flat bed without using bedrails?: A Little Help needed moving from lying on your back to sitting on the side of a flat bed without using bedrails?: A Little Help needed moving to and from a bed to a chair (including a wheelchair)?: A Lot Help needed standing up from a chair using your arms (e.g., wheelchair or bedside chair)?: A Lot Help needed to walk in hospital room?: A Lot Help needed climbing 3-5 steps with a railing? : Total 6 Click Score: 13    End of Session Equipment Utilized During Treatment:  Gait belt Activity Tolerance: Patient tolerated treatment well Patient left: in chair;with call bell/phone within reach;with chair alarm set Nurse Communication: Mobility status PT Visit Diagnosis: Difficulty in walking, not elsewhere classified (R26.2)     Time: 5834-6219 PT Time Calculation (min) (ACUTE ONLY): 37 min  Charges:  $Therapeutic Exercise: 8-22 mins $Therapeutic Activity: 8-22 mins                     [place] Pager [phone] Office [phone]    Shannen Vernon 11/08/2019, 5:26 PM

## 2019-11-08 NOTE — Plan of Care (Signed)
  Problem: Clinical Measurements: Goal: Ability to maintain clinical measurements within normal limits will improve Outcome: Progressing Goal: Will remain free from infection Outcome: Progressing Goal: Diagnostic test results will improve Outcome: Progressing Goal: Respiratory complications will improve Outcome: Progressing Goal: Cardiovascular complication will be avoided Outcome: Progressing   Problem: Coping: Goal: Level of anxiety will decrease Outcome: Progressing   Problem: Pain Managment: Goal: General experience of comfort will improve Outcome: Progressing   Problem: Education: Goal: Knowledge of the prescribed therapeutic regimen will improve Outcome: Progressing

## 2019-11-08 NOTE — Evaluation (Signed)
Occupational Therapy Evaluation Patient Details Name: Diana Oneill MRN: [id number] DOB: 1955/05/06 Today's Date: 11/08/2019    History of Present Illness Pt s/p revision of L TKR and with hx of bil TKR, COPD, R ankle fx (18) and bipolar   Clinical Impression   Pt was functioning modified independently using a cane prior to admission. She lives with her husband who works long hours. Pt presents with generalized weakness and impaired balance. She requires set up to max assist for ADL and min to mod assist for mobility. Pt will need post acute rehab in SNF prior to return home. Will follow acutely.    Follow Up Recommendations  SNF;Supervision/Assistance - 24 hour    Equipment Recommendations  Other (comment) (defer to next venue)    Recommendations for Other Services       Precautions / Restrictions Precautions Precautions: Knee;Fall Restrictions Weight Bearing Restrictions: Yes LLE Weight Bearing: Touchdown weight bearing      Mobility Bed Mobility               General bed mobility comments: pt received and returned to chair  Transfers Overall transfer level: Needs assistance Equipment used: Rolling walker (2 wheeled) Transfers: Sit to/from Stand Sit to Stand: Mod assist         General transfer comment: heavy reliance on UEs, assist to rise and steady, pt able to maintain WB status with min assist    Balance Overall balance assessment: Needs assistance   Sitting balance-Leahy Scale: Good     Standing balance support: Bilateral upper extremity supported Standing balance-Leahy Scale: Poor                             ADL either performed or assessed with clinical judgement   ADL Overall ADL's : Needs assistance/impaired Eating/Feeding: Independent;Sitting   Grooming: Wash/dry hands;Wash/dry face;Sitting;Set up   Upper Body Bathing: Set up;Sitting   Lower Body Bathing: Maximal assistance;Sitting/lateral leans;Sit to/from  stand   Upper Body Dressing : Set up;Sitting   Lower Body Dressing: Maximal assistance;Sitting/lateral leans   Toilet Transfer: Minimal assistance;Stand-pivot;BSC;RW   Toileting- Clothing Manipulation and Hygiene: Set up;Sitting/lateral lean               Vision Baseline Vision/History: Wears glasses Wears Glasses: At all times Patient Visual Report: No change from baseline       Perception     Praxis      Pertinent Vitals/Pain       Hand Dominance Right   Extremity/Trunk Assessment Upper Extremity Assessment Upper Extremity Assessment: RUE deficits/detail RUE Deficits / Details: s/p RCR in March 2021, limited shoulder ROM, was receiving therapy RUE Coordination: decreased gross motor   Lower Extremity Assessment Lower Extremity Assessment: Defer to PT evaluation   Cervical / Trunk Assessment Cervical / Trunk Assessment: Normal (obesity)   Communication Communication Communication: No difficulties   Cognition Arousal/Alertness: Awake/alert Behavior During Therapy: WFL for tasks assessed/performed Overall Cognitive Status: Within Functional Limits for tasks assessed                                     General Comments       Exercises     Shoulder Instructions      Home Living Family/patient expects to be discharged to:: Skilled nursing facility Living Arrangements: Spouse/significant other  Additional Comments: spouse works long hours      Prior Functioning/Environment Level of Independence: Needs assistance  Gait / Transfers Assistance Needed: walking with cane ADL's / Homemaking Assistance Needed: assist of spouse as needed with IADL            OT Problem List: Decreased strength;Impaired balance (sitting and/or standing);Decreased knowledge of use of DME or AE;Obesity;Pain      OT Treatment/Interventions: Self-care/ADL training;DME and/or AE instruction;Patient/family  education;Balance training;Therapeutic activities    OT Goals(Current goals can be found in the care plan section) Acute Rehab OT Goals Patient Stated Goal: Regain IND OT Goal Formulation: With patient Time For Goal Achievement: 11/22/19 Potential to Achieve Goals: Good  OT Frequency: Min 2X/week   Barriers to D/C: Decreased caregiver support          Co-evaluation              AM-PAC OT "6 Clicks" Daily Activity     Outcome Measure Help from another person eating meals?: None Help from another person taking care of personal grooming?: A Little Help from another person toileting, which includes using toliet, bedpan, or urinal?: A Lot Help from another person bathing (including washing, rinsing, drying)?: A Lot Help from another person to put on and taking off regular upper body clothing?: A Little Help from another person to put on and taking off regular lower body clothing?: A Lot 6 Click Score: 16   End of Session Equipment Utilized During Treatment: Rolling walker;Gait belt  Activity Tolerance: Patient tolerated treatment well Patient left: in chair;with call bell/phone within reach;with chair alarm set  OT Visit Diagnosis: Other abnormalities of gait and mobility (R26.89);Unsteadiness on feet (R26.81);Pain;Muscle weakness (generalized) (M62.81)                Time: 2423-5361 OT Time Calculation (min): 19 min Charges:  OT General Charges $OT Visit: 1 Visit OT Evaluation $OT Eval Moderate Complexity: 1 Mod  Nestor Lewandowsky, OTR/L Acute Rehabilitation Services Pager: [phone] Office: [phone]  Malka So 11/08/2019, 2:52 PM

## 2019-11-09 LAB — CBC
HCT: 26.8 % — ABNORMAL LOW (ref 36.0–46.0)
Hemoglobin: 8.6 g/dL — ABNORMAL LOW (ref 12.0–15.0)
MCH: 28.1 pg (ref 26.0–34.0)
MCHC: 32.1 g/dL (ref 30.0–36.0)
MCV: 87.6 fL (ref 80.0–100.0)
Platelets: 135 10*3/uL — ABNORMAL LOW (ref 150–400)
RBC: 3.06 MIL/uL — ABNORMAL LOW (ref 3.87–5.11)
RDW: 17.4 % — ABNORMAL HIGH (ref 11.5–15.5)
WBC: 5.5 10*3/uL (ref 4.0–10.5)
nRBC: 0 % (ref 0.0–0.2)

## 2019-11-09 LAB — BASIC METABOLIC PANEL
Anion gap: 9 (ref 5–15)
BUN: 38 mg/dL — ABNORMAL HIGH (ref 8–23)
CO2: 22 mmol/L (ref 22–32)
Calcium: 8.6 mg/dL — ABNORMAL LOW (ref 8.9–10.3)
Chloride: 107 mmol/L (ref 98–111)
Creatinine, Ser: 1.46 mg/dL — ABNORMAL HIGH (ref 0.44–1.00)
GFR calc Af Amer: 44 mL/min — ABNORMAL LOW (ref >60)
GFR calc non Af Amer: 38 mL/min — ABNORMAL LOW (ref >60)
Glucose, Bld: 149 mg/dL — ABNORMAL HIGH (ref 70–99)
Potassium: 4.9 mmol/L (ref 3.5–5.1)
Sodium: 138 mmol/L (ref 135–145)

## 2019-11-09 MED ORDER — HYDROMORPHONE HCL 2 MG PO TABS
2.0000 mg | ORAL_TABLET | ORAL | Status: DC | PRN
  Administered 2019-11-09 – 2019-11-11 (×11): 4 mg via ORAL
  Filled 2019-11-09 (×11): qty 2

## 2019-11-09 MED ORDER — BISACODYL 5 MG PO TBEC: 5.0000 mg | DELAYED_RELEASE_TABLET | Freq: Every day | ORAL | Status: DC | PRN

## 2019-11-09 NOTE — Progress Notes (Signed)
     Subjective: 2 Days Post-Op Procedure(s) (LRB): TOTAL KNEE REVISION (Left)   Patient reports pain as moderate, pain controlled with medication.  States that she has increased pain this morning, but hat she also slept with her knee bent throughout the night. Discussed the dressing and the nurse can remove ACE and padding later today. TDWB.  Planning on SNF once ready.   Objective:   VITALS:   Vitals:   11/08/19 2150 11/09/19 0500  BP: 114/63 112/69  Pulse: (!) 56 (!) 59  Resp: 16 16  Temp: 97.9 F (36.6 C) 97.9 F (36.6 C)  SpO2: (!) 85% 98%    Dorsiflexion/Plantar flexion intact Incision: dressing C/D/I No cellulitis present Compartment soft  LABS Recent Labs    11/08/19 0258 11/09/19 0316  HGB 8.7* 8.6*  HCT 27.3* 26.8*  WBC 7.2 5.5  PLT 133* 135*    Recent Labs    11/08/19 0258 11/09/19 0316  NA 133* 138  K 5.2* 4.9  BUN 41* 38*  CREATININE 1.38* 1.46*  GLUCOSE 157* 149*     Assessment/Plan: 2 Days Post-Op Procedure(s) (LRB): TOTAL KNEE REVISION (Left) Padding and ACE can be removed later by the nurse Up with therapy Discharge to SNF eventually, when ready / arranged, probably tomorrow      Danae Orleans PA-C  [facility]  [place] [address], [place], [place] [address] Phone: [phone] [email or web address] Facebook  [employer]

## 2019-11-09 NOTE — Plan of Care (Signed)
  Problem: Clinical Measurements: Goal: Cardiovascular complication will be avoided Outcome: Progressing   Problem: Activity: Goal: Risk for activity intolerance will decrease Outcome: Progressing   Problem: Nutrition: Goal: Adequate nutrition will be maintained Outcome: Progressing   Problem: Coping: Goal: Level of anxiety will decrease Outcome: Progressing   Problem: Elimination: Goal: Will not experience complications related to bowel motility Outcome: Progressing   Problem: Pain Managment: Goal: General experience of comfort will improve Outcome: Progressing

## 2019-11-09 NOTE — Progress Notes (Signed)
Diana Oneill called about pt losing iv access. Pt pain medication adjusted and moved to po route. See epic orders.

## 2019-11-09 NOTE — Progress Notes (Signed)
Physical Therapy Treatment Patient Details Name: Diana Oneill MRN: [id number] DOB: 08-Jun-1955 Today's Date: 11/09/2019    History of Present Illness Pt s/p revision of L TKR and with hx of bil TKR, COPD, R ankle fx (18) and bipolar    PT Comments    Pt continues cooperative and with arrival of R ankle brace from home, pt up to ambulate short distance this date with RW and frequent cues for TWB on L.   Follow Up Recommendations  SNF     Equipment Recommendations  None recommended by PT    Recommendations for Other Services       Precautions / Restrictions Precautions Precautions: Knee;Fall Restrictions Weight Bearing Restrictions: Yes LLE Weight Bearing: Touchdown weight bearing    Mobility  Bed Mobility               General bed mobility comments: pt received and returned to chair  Transfers Overall transfer level: Needs assistance Equipment used: Rolling walker (2 wheeled) Transfers: Sit to/from Stand Sit to Stand: Min assist;Mod assist         General transfer comment: min cues for use of UEs; physical assist to bring wt up and fwd and to balance in initial standing  Ambulation/Gait Ambulation/Gait assistance: Min assist;+2 safety/equipment Gait Distance (Feet): 14 Feet Assistive device: Rolling walker (2 wheeled) Gait Pattern/deviations: Step-to pattern;Decreased step length - right;Decreased step length - left;Decreased stance time - left;Shuffle;Trunk flexed Gait velocity: decr   General Gait Details: cues for sequence, posture, position from RW, increased UE WB and toe touch WB on L LE   Stairs             Wheelchair Mobility    Modified Rankin (Stroke Patients Only)       Balance Overall balance assessment: Needs assistance Sitting-balance support: No upper extremity supported Sitting balance-Leahy Scale: Good     Standing balance support: Bilateral upper extremity supported Standing balance-Leahy Scale: Fair                               Cognition Arousal/Alertness: Awake/alert Behavior During Therapy: WFL for tasks assessed/performed Overall Cognitive Status: Within Functional Limits for tasks assessed                                        Exercises Total Joint Exercises Ankle Circles/Pumps: AROM;Both;15 reps;Supine Quad Sets: AROM;Both;10 reps;Supine Heel Slides: AAROM;Left;Supine;20 reps Straight Leg Raises: AAROM;Left;15 reps;Supine    General Comments        Pertinent Vitals/Pain Pain Assessment: 0-10 Pain Score: 6  Pain Location: L knee Pain Descriptors / Indicators: Aching;Throbbing Pain Intervention(s): Limited activity within patient's tolerance;Monitored during session;Premedicated before session;Ice applied    Home Living                      Prior Function            PT Goals (current goals can now be found in the care plan section) Acute Rehab PT Goals Patient Stated Goal: Regain IND PT Goal Formulation: With patient Time For Goal Achievement: 11/14/19 Potential to Achieve Goals: Fair Progress towards PT goals: Progressing toward goals    Frequency    Min 6X/week      PT Plan Current plan remains appropriate    Co-evaluation  AM-PAC PT "6 Clicks" Mobility   Outcome Measure  Help needed turning from your back to your side while in a flat bed without using bedrails?: A Little Help needed moving from lying on your back to sitting on the side of a flat bed without using bedrails?: A Little Help needed moving to and from a bed to a chair (including a wheelchair)?: A Little Help needed standing up from a chair using your arms (e.g., wheelchair or bedside chair)?: A Lot Help needed to walk in hospital room?: A Lot Help needed climbing 3-5 steps with a railing? : A Little 6 Click Score: 16    End of Session Equipment Utilized During Treatment: Gait belt Activity Tolerance: Patient tolerated treatment  well Patient left: in chair;with call bell/phone within reach;with chair alarm set Nurse Communication: Mobility status PT Visit Diagnosis: Difficulty in walking, not elsewhere classified (R26.2)     Time: 1240-1318 PT Time Calculation (min) (ACUTE ONLY): 38 min  Charges:  $Gait Training: 8-22 mins $Therapeutic Exercise: 8-22 mins $Therapeutic Activity: 8-22 mins                     [place] Pager [phone] Office [phone]    [name] 11/09/2019, 3:16 PM

## 2019-11-10 LAB — BASIC METABOLIC PANEL
Anion gap: 10 (ref 5–15)
Anion gap: 11 (ref 5–15)
BUN: 38 mg/dL — ABNORMAL HIGH (ref 8–23)
BUN: 38 mg/dL — ABNORMAL HIGH (ref 8–23)
CO2: 27 mmol/L (ref 22–32)
CO2: 27 mmol/L (ref 22–32)
Calcium: 8.7 mg/dL — ABNORMAL LOW (ref 8.9–10.3)
Calcium: 8.8 mg/dL — ABNORMAL LOW (ref 8.9–10.3)
Chloride: 103 mmol/L (ref 98–111)
Chloride: 104 mmol/L (ref 98–111)
Creatinine, Ser: 1.21 mg/dL — ABNORMAL HIGH (ref 0.44–1.00)
Creatinine, Ser: 1.35 mg/dL — ABNORMAL HIGH (ref 0.44–1.00)
GFR calc Af Amer: 55 mL/min — ABNORMAL LOW (ref >60)
GFR calc Af Amer: 48 mL/min — ABNORMAL LOW (ref >60)
GFR calc non Af Amer: 48 mL/min — ABNORMAL LOW (ref >60)
GFR calc non Af Amer: 42 mL/min — ABNORMAL LOW (ref >60)
Glucose, Bld: 97 mg/dL (ref 70–99)
Glucose, Bld: 113 mg/dL — ABNORMAL HIGH (ref 70–99)
Potassium: 4.9 mmol/L (ref 3.5–5.1)
Potassium: 4.5 mmol/L (ref 3.5–5.1)
Sodium: 140 mmol/L (ref 135–145)
Sodium: 142 mmol/L (ref 135–145)

## 2019-11-10 LAB — CBC
HCT: 31.4 % — ABNORMAL LOW (ref 36.0–46.0)
HCT: 30.4 % — ABNORMAL LOW (ref 36.0–46.0)
Hemoglobin: 9.9 g/dL — ABNORMAL LOW (ref 12.0–15.0)
Hemoglobin: 9.8 g/dL — ABNORMAL LOW (ref 12.0–15.0)
MCH: 28.1 pg (ref 26.0–34.0)
MCH: 28.4 pg (ref 26.0–34.0)
MCHC: 31.5 g/dL (ref 30.0–36.0)
MCHC: 32.2 g/dL (ref 30.0–36.0)
MCV: 89.2 fL (ref 80.0–100.0)
MCV: 88.1 fL (ref 80.0–100.0)
Platelets: 162 10*3/uL (ref 150–400)
Platelets: 147 10*3/uL — ABNORMAL LOW (ref 150–400)
RBC: 3.52 MIL/uL — ABNORMAL LOW (ref 3.87–5.11)
RBC: 3.45 MIL/uL — ABNORMAL LOW (ref 3.87–5.11)
RDW: 17.6 % — ABNORMAL HIGH (ref 11.5–15.5)
RDW: 17.3 % — ABNORMAL HIGH (ref 11.5–15.5)
WBC: 6.8 10*3/uL (ref 4.0–10.5)
WBC: 7.5 10*3/uL (ref 4.0–10.5)
nRBC: 0 % (ref 0.0–0.2)
nRBC: 0 % (ref 0.0–0.2)

## 2019-11-10 MED ORDER — ONDANSETRON HCL 4 MG PO TABS: 4.0000 mg | ORAL_TABLET | Freq: Four times a day (QID) | ORAL | 0 refills | Status: DC | PRN

## 2019-11-10 MED ORDER — DOCUSATE SODIUM 100 MG PO CAPS: 100.0000 mg | ORAL_CAPSULE | Freq: Two times a day (BID) | ORAL | 0 refills | Status: DC

## 2019-11-10 NOTE — Plan of Care (Signed)
  Problem: Health Behavior/Discharge Planning: Goal: Ability to manage health-related needs will improve Outcome: Progressing   Problem: Clinical Measurements: Goal: Ability to maintain clinical measurements within normal limits will improve Outcome: Progressing Goal: Will remain free from infection Outcome: Progressing Goal: Diagnostic test results will improve Outcome: Progressing Goal: Respiratory complications will improve Outcome: Progressing Goal: Cardiovascular complication will be avoided Outcome: Progressing   Problem: Activity: Goal: Risk for activity intolerance will decrease Outcome: Progressing   Problem: Nutrition: Goal: Adequate nutrition will be maintained Outcome: Progressing   Problem: Coping: Goal: Level of anxiety will decrease Outcome: Progressing   Problem: Elimination: Goal: Will not experience complications related to bowel motility Outcome: Progressing Goal: Will not experience complications related to urinary retention Outcome: Progressing   Problem: Pain Managment: Goal: General experience of comfort will improve Outcome: Progressing   Problem: Safety: Goal: Ability to remain free from injury will improve Outcome: Progressing   Problem: Skin Integrity: Goal: Risk for impaired skin integrity will decrease Outcome: Progressing   Problem: Education: Goal: Knowledge of the prescribed therapeutic regimen will improve Outcome: Progressing   Problem: Activity: Goal: Ability to avoid complications of mobility impairment will improve Outcome: Progressing Goal: Range of joint motion will improve Outcome: Progressing   Problem: Clinical Measurements: Goal: Postoperative complications will be avoided or minimized Outcome: Progressing   Problem: Pain Management: Goal: Pain level will decrease with appropriate interventions Outcome: Progressing   Problem: Skin Integrity: Goal: Will show signs of wound healing Outcome: Progressing

## 2019-11-10 NOTE — Care Management Important Message (Signed)
Important Message  Patient Details IM Letter given to [facility] Case Manager to present to the Patient Name: Diana Oneill MRN: [id number] Date of Birth: November 28, 1955   Medicare Important Message Given:  Yes     Kerin Salen 11/10/2019, 12:30 PM

## 2019-11-10 NOTE — Progress Notes (Signed)
Patient refused to have ace wrap taken off and tegs- educated about the importance and consequences without teds but patient refused.

## 2019-11-10 NOTE — Progress Notes (Addendum)
Transition of Care (TOC) -30 day Note          Patient Details   Name: Diana Oneill  MRN: [id number]  Date of Birth: 26-Sep-1955     Transition of Care [facility]) CM/SW Contact   Name: Kathrin Greathouse, LCSW  Phone Number: [phone]  Date: [birth year]  Time: 2:12PM     MUST ID: [id number]    To Whom it May Concern:     Please be advised that the above patient will require a short-term nursing home stay, anticipated 30 days or less rehabilitation and strengthening. The plan is for return home.

## 2019-11-10 NOTE — Progress Notes (Signed)
Physical Therapy Treatment Patient Details Name: Laura-Lee Villegas MRN: [id number] DOB: 1955-11-15 Today's Date: 11/10/2019    History of Present Illness Pt s/p revision of L TKR and with hx of bil TKR, COPD, R ankle fx (18) and bipolar    PT Comments    POD # 3 pm session Pt back in bed via nursing so performed Knee TE's followed by ICE.   Follow Up Recommendations  SNF     Equipment Recommendations  None recommended by PT    Recommendations for Other Services       Precautions / Restrictions Precautions Precautions: Knee;Fall Required Braces or Orthoses: Other Brace (R ankle ASO) Other Brace: ASO from home Restrictions Weight Bearing Restrictions: Yes LLE Weight Bearing: Touchdown weight bearing    Mobility       Balance                                            Cognition Arousal/Alertness: Awake/alert Behavior During Therapy: WFL for tasks assessed/performed Overall Cognitive Status: Within Functional Limits for tasks assessed                                 General Comments: AxO x 3 very pleasant      Exercises   Total Knee Revision TE's following HEP handout 10 reps B LE ankle pumps 05 reps towel squeezes 05 reps knee presses 05 reps heel slides  05 reps SAQ's 05 reps SLR's 05 reps ABD Educated on use of gait belt to assist with TE's Followed by ICE     General Comments        Pertinent Vitals/Pain Pain Assessment: 0-10 Pain Score: 8  Pain Location: L knee Pain Descriptors / Indicators: Aching;Throbbing;Tender;Grimacing;Operative site guarding Pain Intervention(s): Monitored during session;Premedicated before session;Repositioned;Ice applied    Home Living                      Prior Function            PT Goals (current goals can now be found in the care plan section) Progress towards PT goals: Progressing toward goals    Frequency    Min 6X/week      PT Plan Current plan  remains appropriate    Co-evaluation              AM-PAC PT "6 Clicks" Mobility   Outcome Measure  Help needed turning from your back to your side while in a flat bed without using bedrails?: A Little Help needed moving from lying on your back to sitting on the side of a flat bed without using bedrails?: A Little Help needed moving to and from a bed to a chair (including a wheelchair)?: A Little Help needed standing up from a chair using your arms (e.g., wheelchair or bedside chair)?: A Lot Help needed to walk in hospital room?: A Lot Help needed climbing 3-5 steps with a railing? : Total 6 Click Score: 14    End of Session Equipment Utilized During Treatment: Gait belt Activity Tolerance: Patient tolerated treatment well Patient left: in bed;with call bell/phone within reach;with chair alarm set Nurse Communication: Mobility status PT Visit Diagnosis: Difficulty in walking, not elsewhere classified (R26.2)     Time: 8756-4332 PT Time Calculation (min) (ACUTE ONLY): 20 min  Charges:   $Therapeutic Exercise: 8-22 mins                     Rica Koyanagi  PTA Acute  Rehabilitation Services Pager      [phone] Office      [phone]

## 2019-11-10 NOTE — Progress Notes (Signed)
Physical Therapy Treatment Patient Details Name: Diana Oneill MRN: [id number] DOB: June 26, 1955 Today's Date: 11/10/2019    History of Present Illness Pt s/p revision of L TKR and with hx of bil TKR, COPD, R ankle fx (18) and bipolar    PT Comments    POD # 1 am session Pt OOB in recliner.  Applied R ASO ans R sneaker.  Educated pt on wearing one shoe to increase stability/safety and to better aide TTWB opposite leg.  Assisted with amb a limited distance.  Returned to room and performed some TE's followed by ICE.   Follow Up Recommendations  SNF     Equipment Recommendations  None recommended by PT    Recommendations for Other Services       Precautions / Restrictions Precautions Precautions: Knee;Fall Required Braces or Orthoses: Other Brace (R ankle ASO) Other Brace: ASO from home Restrictions Weight Bearing Restrictions: Yes LLE Weight Bearing: Touchdown weight bearing    Mobility  Bed Mobility               General bed mobility comments: OOB in recliner  Transfers Overall transfer level: Needs assistance Equipment used: Rolling walker (2 wheeled) Transfers: Sit to/from Stand Sit to Stand: Min assist;Mod assist Stand pivot transfers: Mod assist       General transfer comment: min cues for use of UEs; physical assist to bring wt up and fwd and to balance in initial standing  Ambulation/Gait Ambulation/Gait assistance: Min assist Gait Distance (Feet): 12 Feet Assistive device: Rolling walker (2 wheeled) Gait Pattern/deviations: Step-to pattern;Decreased step length - right;Decreased step length - left;Decreased stance time - left;Shuffle;Trunk flexed Gait velocity: decr   General Gait Details: cues for sequence, posture, position from RW, increased UE WB and toe touch WB on L LE   Stairs             Wheelchair Mobility    Modified Rankin (Stroke Patients Only)       Balance                                             Cognition Arousal/Alertness: Awake/alert Behavior During Therapy: WFL for tasks assessed/performed Overall Cognitive Status: Within Functional Limits for tasks assessed                                 General Comments: AxO x 3 very pleasant      Exercises   Total Knee Replacement TE's following HEP handout 10 reps B LE ankle pumps 05 reps towel squeezes 05 reps knee presses 05 reps heel slides  05 reps SAQ's 05 reps SLR's 05 reps ABD Educated on use of gait belt to assist with TE's Followed by ICE     General Comments        Pertinent Vitals/Pain Pain Assessment: 0-10 Pain Score: 8  Pain Location: L knee Pain Descriptors / Indicators: Aching;Throbbing;Tender;Grimacing;Operative site guarding Pain Intervention(s): Monitored during session;Premedicated before session;Repositioned;Ice applied    Home Living                      Prior Function            PT Goals (current goals can now be found in the care plan section) Progress towards PT goals: Progressing toward goals  Frequency    Min 6X/week      PT Plan Current plan remains appropriate    Co-evaluation              AM-PAC PT "6 Clicks" Mobility   Outcome Measure  Help needed turning from your back to your side while in a flat bed without using bedrails?: A Little Help needed moving from lying on your back to sitting on the side of a flat bed without using bedrails?: A Little Help needed moving to and from a bed to a chair (including a wheelchair)?: A Little Help needed standing up from a chair using your arms (e.g., wheelchair or bedside chair)?: A Lot Help needed to walk in hospital room?: A Lot Help needed climbing 3-5 steps with a railing? : Total 6 Click Score: 14    End of Session Equipment Utilized During Treatment: Gait belt Activity Tolerance: Patient tolerated treatment well Patient left: in chair;with call bell/phone within reach;with chair  alarm set Nurse Communication: Mobility status PT Visit Diagnosis: Difficulty in walking, not elsewhere classified (R26.2)     Time: 8676-1950 PT Time Calculation (min) (ACUTE ONLY): 26 min  Charges:  $Gait Training: 8-22 mins $Therapeutic Exercise: 8-22 mins                     Rica Koyanagi  PTA Acute  Rehabilitation Services Pager      [phone] Office      [phone]

## 2019-11-10 NOTE — TOC Progression Note (Signed)
Transition of Care [facility]) - Progression Note    Patient Details  Name: Andreika Vandagriff MRN: [id number] Date of Birth: 1955/11/20  [facility] CM/SW [place], LCSW Phone Number: 11/10/2019, 10:49 AM  Clinical Narrative:    Patient provided SNF choice, the patient chose [facility] for rehab placement.  -facility notified has been vaccinated. Facility request a covid test 24-48 hours prior to d/c. CSW initiated authorization request. [id number]#[id number] clinicals faxed.  PASRR-level II manuel review. Hp, fl2 and 30 day note faxed.       Expected Discharge Plan: Skilled Nursing Facility Barriers to Discharge: [place] (PASRR), Insurance Authorization, Other (comment) (Covid test.)  Expected Discharge Plan and Services Expected Discharge Plan: [place]   Discharge Planning Services: CM Consult Post Acute Care Choice: [place] Living arrangements for the past 2 months: Single Family Home                                       Social Determinants of Health (SDOH) Interventions    Readmission Risk Interventions No flowsheet data found.

## 2019-11-10 NOTE — Progress Notes (Signed)
° ° °  Subjective:  Patient reports pain as mild to moderate.  Denies N/V/CP/SOB. She is resting comfortably in a chair.  Objective:   VITALS:   Vitals:   11/09/19 0500 11/09/19 1334 11/09/19 2215 11/10/19 0526  BP: 112/69 134/82 108/61 108/63  Pulse: (!) 59 65 71 67  Resp: 16 12 18 20   Temp: 97.9 F (36.6 C) 98 F (36.7 C) 98.5 F (36.9 C) 98 F (36.7 C)  TempSrc:  Oral    SpO2: 98% 100% 99% 100%  Weight:      Height:        NAD Neurovascular intact Sensation intact distally Intact pulses distally Dorsiflexion/Plantar flexion intact Incision: dressing C/D/I  Results for orders placed or performed during the hospital encounter of 11/07/19  Aerobic/Anaerobic Culture (surgical/deep wound)     Status: None (Preliminary result)   Collection Time: 11/07/19 12:27 PM   Specimen: Wound  Result Value Ref Range Status   Specimen Description   Final    WOUND Performed at [facility], [place] [address], [place], [place] [address]    Special Requests   Final    LEFT LATERAL KNEE Performed at [facility], [place] [address], [place], [place] [address]    Gram Stain   Final    FEW WBC PRESENT, PREDOMINANTLY PMN NO ORGANISMS SEEN    Culture   Final    NO GROWTH 3 DAYS NO ANAEROBES ISOLATED; CULTURE IN PROGRESS FOR 5 DAYS Performed at [place] [address], [place], [place] [address]    Report Status PENDING  Incomplete    Lab Results  Component Value Date   WBC 6.8 11/10/2019   HGB 9.9 (L) 11/10/2019   HCT 31.4 (L) 11/10/2019   MCV 89.2 11/10/2019   PLT 162 11/10/2019   BMET    Component Value Date/Time   NA 140 11/10/2019 0848   K 4.9 11/10/2019 0848   CL 103 11/10/2019 0848   CO2 27 11/10/2019 0848   GLUCOSE 97 11/10/2019 0848   BUN 38 (H) 11/10/2019 0848   CREATININE 1.21 (H) 11/10/2019 0848   CALCIUM 8.7 (L) 11/10/2019 0848   GFRNONAA 48 (L) 11/10/2019 0848   GFRAA 55 (L) 11/10/2019 0848      Assessment/Plan: 3 Days Post-Op   Principal Problem:   Failed total knee, left (HCC) Active Problems:   S/P revision of total knee, left   TDWB LLE with walker DVT ppx: Apixaban, SCDs, TEDS PO pain control PT/OT Dispo: DC to SNF tomorrow  Diana Oneill 11/10/2019, 12:27 PM   [facility] is now [facility] [address], [place], [place] [address] Phone: [phone] [email or web address] Facebook   [employer]

## 2019-11-11 LAB — SARS CORONAVIRUS 2 (TAT 6-24 HRS): SARS Coronavirus 2: NEGATIVE

## 2019-11-11 NOTE — TOC Transition Note (Signed)
Transition of Care [facility]) - CM/SW Discharge Note   Patient Details  Name: Teyah Rossy MRN: [id number] Date of Birth: 1955-07-22  Transition of Care [facility]) CM/SW Contact:  Servando Snare, LCSW Phone Number: 11/11/2019, 10:47 AM   Clinical Narrative:   Patient to transport to [facility] by EMS. Patient to report to room 706. RN report# [phone]. Dc docs faxed to facility.   Servando Snare, [place] [phone]   Final next level of care: Skilled Nursing Facility Barriers to Discharge: No Barriers Identified   Patient Goals and CMS Choice Patient states their goals for this hospitalization and ongoing recovery are:: "I need to go to rehab-hopefully in [place] so my husband can visit." CMS Medicare.gov Compare Post Acute Care list provided to:: Patient Choice offered to / list presented to : Patient  Discharge Placement              Patient chooses bed at: [facility],  Patient to be transferred to facility by: EMS      Discharge Plan and Services   Discharge Planning Services: CM Consult Post Acute Care Choice: [place]          DME Arranged: N/A DME Agency: NA       HH Arranged: NA HH Agency: NA        Social Determinants of Health (SDOH) Interventions     Readmission Risk Interventions No flowsheet data found.

## 2019-11-11 NOTE — Plan of Care (Signed)
Plan of care reviewed and discussed with the patient. 

## 2019-11-11 NOTE — Discharge Summary (Signed)
Physician Discharge Summary  Patient ID: Diana Oneill MRN: [id number] DOB/AGE: 10-18-1955 64 y.o.  Admit date: 11/07/2019 Discharge date: 11/11/2019  Admission Diagnoses:  Failed total knee, left [facility])  Discharge Diagnoses:  Principal Problem:   Failed total knee, left (HCC) Active Problems:   S/P revision of total knee, left   Past Medical History:  Diagnosis Date  . Acidosis 10/25/2016  . Acute encephalopathy 10/25/2016  . Acute kidney injury ([place])   . Acute respiratory failure ([place]) 10/25/2016  . Anemia   . Ankle pain 07/10/2017  . Anxiety    no meds  . Arthritis   . Asthma   . Bipolar disorder, now depressed ([place]) 08/10/2014   not on meds at this time  . Cancer ([employer])    skin cancer, uterine cancer  . CKD (chronic kidney disease), stage III   . Closed displaced trimalleolar fracture of right ankle 01/05/2017  . Cocaine use disorder, mild, in early remission ([place]) 08/10/2014  . COPD (chronic obstructive pulmonary disease) ([place])   . Depression   . Dyspnea    with exertion  . Essential hypertension 10/25/2016   takes meds as needed  . History of kidney stones   . Hypertension   . Hyponatremia   . Nausea without vomiting 10/25/2016  . Obese   . Obesity 10/25/2016  . Osteoarthritis   . Osteoarthritis of left knee 10/23/2016  . Sleep apnea    does not use CPAP "Makes too much Noise"  . Tachycardia 10/25/2016  . Thoracic aortic aneurysm ([place]) 09/2016   4.1 cm ascending thoracic aortic aneurysm.  . Thrombocytopenia ([place]) 09/2016    Surgeries: Procedure(s): TOTAL KNEE REVISION on 11/07/2019   Consultants (if any):   Discharged Condition: Improved  Hospital Course: Diana Oneill is an 64 y.o. female who was admitted 11/07/2019 with a diagnosis of Failed total knee, left ([place]) and went to the operating room on 11/07/2019 and underwent the above named procedures.    She was given perioperative antibiotics:  Anti-infectives (From admission, onward)   Start      Dose/Rate Route Frequency Ordered Stop   11/07/19 2200  vancomycin (VANCOCIN) IVPB 1000 mg/200 mL premix        1,000 mg 200 mL/hr over 60 Minutes Intravenous Every 12 hours 11/07/19 1651 11/07/19 2256   11/07/19 1309  vancomycin (VANCOCIN) powder  Status:  Discontinued          As needed 11/07/19 1309 11/07/19 1646   11/07/19 0830  vancomycin (VANCOCIN) IVPB 1000 mg/200 mL premix        1,000 mg 200 mL/hr over 60 Minutes Intravenous On call to O.R. 11/07/19 0825 11/07/19 1055    .  Postoperatively she was made TDWB LLE with a walker. Minimal elevation in creatinine which self-resolved. Due to difficulty maintaining WB precautions, recommendation was made for SNF.   She was given sequential compression devices, early ambulation, and apixaban for DVT prophylaxis.  She benefited maximally from the hospital stay and there were no complications.    Recent vital signs:  Vitals:   11/10/19 2255 11/11/19 0652  BP: 99/60 117/66  Pulse: 72 68  Resp: 15 16  Temp: 98.4 F (36.9 C) 98.1 F (36.7 C)  SpO2: 100% 97%    Recent laboratory studies:  Lab Results  Component Value Date   HGB 9.8 (L) 11/10/2019   HGB 9.9 (L) 11/10/2019   HGB 8.6 (L) 11/09/2019   Lab Results  Component Value Date   WBC 7.5  11/10/2019   PLT 147 (L) 11/10/2019   Lab Results  Component Value Date   INR 1.0 11/04/2019   Lab Results  Component Value Date   NA 142 11/10/2019   K 4.5 11/10/2019   CL 104 11/10/2019   CO2 27 11/10/2019   BUN 38 (H) 11/10/2019   CREATININE 1.35 (H) 11/10/2019   GLUCOSE 113 (H) 11/10/2019    Discharge Medications:   Allergies as of 11/11/2019      Reactions   Penicillins Anaphylaxis, Other (See Comments)   Has patient had a PCN reaction causing immediate rash, facial/tongue/throat swelling, SOB or lightheadedness with hypotension: Yes Has patient had a PCN reaction causing severe rash involving mucus membranes or skin necrosis: No Has patient had a PCN reaction that  required hospitalization: No Has patient had a PCN reaction occurring within the last 10 years: No If all of the above answers are "NO", then may proceed with Cephalosporin use.   Prozac [fluoxetine Hcl] Hives, Swelling, Other (See Comments)   Facial swelling      Medication List    STOP taking these medications   alendronate 70 MG tablet Commonly known as: FOSAMAX   HYDROcodone-acetaminophen 5-325 MG tablet Commonly known as: NORCO/VICODIN   oxyCODONE-acetaminophen 10-325 MG tablet Commonly known as: PERCOCET     TAKE these medications   albuterol 108 (90 Base) MCG/ACT inhaler Commonly known as: VENTOLIN HFA Inhale 2 puffs into the lungs every 6 (six) hours as needed for wheezing or shortness of breath.   apixaban 2.5 MG Tabs tablet Commonly known as: ELIQUIS Take 1 tablet (2.5 mg total) by mouth 2 (two) times daily.   azelastine 0.1 % nasal spray Commonly known as: ASTELIN Place 2 sprays into both nostrils 2 (two) times daily as needed for rhinitis or allergies.   Chantix 1 MG tablet Generic drug: varenicline Take 1 mg by mouth 2 (two) times daily.   Combivent Respimat 20-100 MCG/ACT Aers respimat Generic drug: Ipratropium-Albuterol Inhale 1 puff into the lungs every 6 (six) hours as needed for wheezing or shortness of breath.   docusate sodium 100 MG capsule Commonly known as: COLACE Take 1 capsule (100 mg total) by mouth 2 (two) times daily.   ferrous sulfate 325 (65 FE) MG EC tablet Take 325 mg by mouth 3 (three) times daily with meals.   fluticasone 50 MCG/ACT nasal spray Commonly known as: FLONASE Place 2 sprays into both nostrils 2 (two) times daily as needed for allergies.   furosemide 40 MG tablet Commonly known as: LASIX Take 40 mg by mouth daily.   gabapentin 300 MG capsule Commonly known as: NEURONTIN Take 600 mg by mouth 4 (four) times daily.   ketoconazole 2 % shampoo Commonly known as: NIZORAL Apply 1 application topically 2 (two) times a  week.   levocetirizine 5 MG tablet Commonly known as: XYZAL Take 5 mg by mouth daily as needed for allergies.   lisinopril-hydrochlorothiazide 10-12.5 MG tablet Commonly known as: ZESTORETIC Take 1 tablet by mouth daily.   montelukast 10 MG tablet Commonly known as: SINGULAIR Take 10 mg by mouth daily.   nystatin powder Commonly known as: MYCOSTATIN/NYSTOP Apply 1 application topically daily.   ondansetron 4 MG tablet Commonly known as: ZOFRAN Take 1 tablet (4 mg total) by mouth every 6 (six) hours as needed for nausea.   Oxycodone HCl 10 MG Tabs Take 1 tablet (10 mg total) by mouth every 6 (six) hours as needed for up to 7 days.   tiZANidine  4 MG tablet Commonly known as: ZANAFLEX Take 4 mg by mouth at bedtime.   Trelegy Ellipta 200-62.5-25 MCG/INH Aepb Generic drug: Fluticasone-Umeclidin-Vilant Inhale one dose once daily to prevent cough or wheeze.  Rinse, gargle, and spit after use. What changed:   how much to take  how to take this  when to take this   triamcinolone cream 0.1 % Commonly known as: KENALOG Apply 1 application topically 2 (two) times daily as needed (itching).   VITAMIN B COMPLEX PO Take 1 tablet by mouth daily.   VITAMIN C PO Take 1,000 mg by mouth daily.   Vitamin D3 125 MCG (5000 UT) Caps Take 5,000 Units by mouth daily.            Discharge Care Instructions  (From admission, onward)         Start     Ordered   11/11/19 0000  Touch down weight bearing        11/11/19 1108          Diagnostic Studies: DG Knee Left Port  Result Date: 11/07/2019 CLINICAL DATA:  Left total knee arthroplasty. EXAM: PORTABLE LEFT KNEE - 1-2 VIEW COMPARISON:  None. FINDINGS: Left knee total arthroplasty in expected alignment. There is patellofemoral arthroplasty. No periprosthetic lucency or fracture. Recent postsurgical change includes air and edema in the joint space and soft tissues. Anterior skin staples. IMPRESSION: Left knee total  arthroplasty without immediate postoperative complication. Electronically Signed   By: Keith Rake M.D.   On: 11/07/2019 15:42    Disposition: Discharge disposition: 03-Skilled Nursing Facility       Discharge Instructions    Call MD / Call 911   Complete by: As directed    If you experience chest pain or shortness of breath, CALL 911 and be transported to the hospital emergency room.  If you develope a fever above 101 F, pus (white drainage) or increased drainage or redness at the wound, or calf pain, call your surgeon's office.   Constipation Prevention   Complete by: As directed    Drink plenty of fluids.  Prune juice may be helpful.  You may use a stool softener, such as Colace (over the counter) 100 mg twice a day.  Use MiraLax (over the counter) for constipation as needed.   Diet - low sodium heart healthy   Complete by: As directed    Do not put a pillow under the knee. Place it under the heel.   Complete by: As directed    Driving restrictions   Complete by: As directed    No driving for 12 weeks   Lifting restrictions   Complete by: As directed    No lifting for 12 weeks   TED hose   Complete by: As directed    Use stockings (TED hose) for 2 weeks on both leg(s).  You may remove them at night for sleeping.  If TEDs will not fit her legs use ACE wraps instead   Touch down weight bearing   Complete by: As directed        Contact information for follow-up providers    Swinte[place], Aaron Edelman, MD. Schedule an appointment as soon as possible for a visit in 2 weeks.   Specialty: Orthopedic Surgery Why: For wound re-check, For suture removal Contact information: [address] [place] [address] [phone]            Contact information for after-discharge care    Destination    [facility] .   Service: Skilled Nursing Contact information: [place] [phone]                    Signed: Dorothyann Peng 11/11/2019, 11:12 AM

## 2019-11-12 LAB — AEROBIC/ANAEROBIC CULTURE W GRAM STAIN (SURGICAL/DEEP WOUND): Culture: NO GROWTH

## 2019-11-14 ENCOUNTER — Encounter (HOSPITAL_COMMUNITY): Payer: Self-pay | Admitting: Orthopedic Surgery

## 2019-12-30 ENCOUNTER — Ambulatory Visit: Payer: Medicare Other | Admitting: Allergy

## 2020-01-06 ENCOUNTER — Encounter: Payer: Self-pay | Admitting: Sports Medicine

## 2020-01-06 ENCOUNTER — Ambulatory Visit (INDEPENDENT_AMBULATORY_CARE_PROVIDER_SITE_OTHER): Payer: Medicare Other | Admitting: Sports Medicine

## 2020-01-06 ENCOUNTER — Other Ambulatory Visit: Payer: Self-pay

## 2020-01-06 DIAGNOSIS — M722 Plantar fascial fibromatosis: Secondary | ICD-10-CM

## 2020-01-06 DIAGNOSIS — M779 Enthesopathy, unspecified: Secondary | ICD-10-CM

## 2020-01-06 DIAGNOSIS — B351 Tinea unguium: Secondary | ICD-10-CM

## 2020-01-06 DIAGNOSIS — Z87898 Personal history of other specified conditions: Secondary | ICD-10-CM

## 2020-01-06 DIAGNOSIS — M1250 Traumatic arthropathy, unspecified site: Secondary | ICD-10-CM

## 2020-01-06 DIAGNOSIS — M25571 Pain in right ankle and joints of right foot: Secondary | ICD-10-CM

## 2020-01-06 MED ORDER — TRIAMCINOLONE ACETONIDE 40 MG/ML IJ SUSP
20.0000 mg | Freq: Once | INTRAMUSCULAR | Status: AC
  Administered 2020-01-06: 20 mg

## 2020-01-06 NOTE — Patient Instructions (Signed)
For tennis shoes recommend:  Kandy Garrison Ascis New balance [place] Can be purchased at [employer] or [employer] arch fit Can be purchased at any major retailers  Vionic  SAS Can be purchased at [employer] or [place]   For work shoes recommend: Runner, broadcasting/film/video Work [employer] Can be purchased at a variety of places or Engineer, maintenance (IT)   For casual shoes recommend: Vionic  Can be purchased at [employer] or [employer]   For Over the [employer] recommend: Power Steps Can be purchased in office/[employer] Can be purchased at [employer] or [employer] Can be purchased at [employer]

## 2020-01-06 NOTE — Progress Notes (Signed)
Subjective: Diana Oneill is a 64 y.o. female patient who returns to office for follow up evaluation of right ankle pain and now new onset heel pain. Patient reports that after her knee surgery on the left she has put a lot of weight and pressure on the right and the right foot has been bad especially at the heel with swelling.  Patient requests repeat injection.  No other issues noted.  Patient Active Problem List   Diagnosis Date Noted  . Failed total knee, left ([place]) 11/07/2019  . S/P revision of total knee, left 11/07/2019  . Morbid (severe) obesity with alveolar hypoventilation ([place]) 09/12/2019  . Coronavirus infection 04/21/2019  . Preoperative cardiovascular examination 04/18/2019  . Overweight 04/18/2019  . Cigarette smoker 04/18/2019  . Chronic knee pain after total replacement of left knee joint 03/05/2019  . Ankle pain 07/10/2017  . Closed displaced trimalleolar fracture of right ankle 01/05/2017  . Acute encephalopathy 10/25/2016  . Essential hypertension 10/25/2016  . Obesity 10/25/2016  . Acidosis 10/25/2016  . Nausea without vomiting 10/25/2016  . Tachycardia 10/25/2016  . COPD (chronic obstructive pulmonary disease) ([place]) 10/25/2016  . Acute respiratory failure ([place]) 10/25/2016  . Hyponatremia   . Acute kidney injury ([place])   . Anemia   . Thrombocytopenia ([place])   . Osteoarthritis of left knee 10/23/2016  . Bipolar disorder, now depressed ([place]) 08/10/2014  . Cocaine use disorder, mild, in early remission ([place]) 08/10/2014    Current Outpatient Medications on File Prior to Visit  Medication Sig Dispense Refill  . albuterol (VENTOLIN HFA) 108 (90 Base) MCG/ACT inhaler Inhale 2 puffs into the lungs every 6 (six) hours as needed for wheezing or shortness of breath.     Marland Kitchen apixaban (ELIQUIS) 2.5 MG TABS tablet Take 1 tablet (2.5 mg total) by mouth 2 (two) times daily. 60 tablet 0  . Ascorbic Acid (VITAMIN C PO) Take 1,000 mg by mouth daily.     Marland Kitchen azelastine  (ASTELIN) 0.1 % nasal spray Place 2 sprays into both nostrils 2 (two) times daily as needed for rhinitis or allergies.     . B Complex Vitamins (VITAMIN B COMPLEX PO) Take 1 tablet by mouth daily.     . CHANTIX 1 MG tablet Take 1 mg by mouth 2 (two) times daily.    . Cholecalciferol (VITAMIN D3) 125 MCG (5000 UT) CAPS Take 5,000 Units by mouth daily.    . COMBIVENT RESPIMAT 20-100 MCG/ACT AERS respimat Inhale 1 puff into the lungs every 6 (six) hours as needed for wheezing or shortness of breath.     . docusate sodium (COLACE) 100 MG capsule Take 1 capsule (100 mg total) by mouth 2 (two) times daily. 60 capsule 0  . ferrous sulfate 325 (65 FE) MG EC tablet Take 325 mg by mouth 3 (three) times daily with meals.    . fluticasone (FLONASE) 50 MCG/ACT nasal spray Place 2 sprays into both nostrils 2 (two) times daily as needed for allergies.     . Fluticasone-Umeclidin-Vilant (TRELEGY ELLIPTA) 200-62.5-25 MCG/INH AEPB Inhale one dose once daily to prevent cough or wheeze.  Rinse, gargle, and spit after use. (Patient taking differently: Inhale 1 puff into the lungs daily. Inhale one dose once daily to prevent cough or wheeze.  Rinse, gargle, and spit after use.) 60 each 5  . furosemide (LASIX) 40 MG tablet Take 40 mg by mouth daily.     Marland Kitchen gabapentin (NEURONTIN) 300 MG capsule Take 600 mg by mouth 4 (four)  times daily.    Marland Kitchen ketoconazole (NIZORAL) 2 % shampoo Apply 1 application topically 2 (two) times a week.     . levocetirizine (XYZAL) 5 MG tablet Take 5 mg by mouth daily as needed for allergies.    Marland Kitchen lisinopril-hydrochlorothiazide (ZESTORETIC) 10-12.5 MG tablet Take 1 tablet by mouth daily.    . montelukast (SINGULAIR) 10 MG tablet Take 10 mg by mouth daily.    Marland Kitchen nystatin (MYCOSTATIN/NYSTOP) powder Apply 1 application topically daily.    . ondansetron (ZOFRAN) 4 MG tablet Take 1 tablet (4 mg total) by mouth every 6 (six) hours as needed for nausea. 20 tablet 0  . tiZANidine (ZANAFLEX) 4 MG tablet Take 4  mg by mouth at bedtime.     . triamcinolone cream (KENALOG) 0.1 % Apply 1 application topically 2 (two) times daily as needed (itching).     No current facility-administered medications on file prior to visit.    Allergies  Allergen Reactions  . Penicillins Anaphylaxis and Other (See Comments)    Has patient had a PCN reaction causing immediate rash, facial/tongue/throat swelling, SOB or lightheadedness with hypotension: Yes Has patient had a PCN reaction causing severe rash involving mucus membranes or skin necrosis: No Has patient had a PCN reaction that required hospitalization: No Has patient had a PCN reaction occurring within the last 10 years: No If all of the above answers are "NO", then may proceed with Cephalosporin use.  . Prozac [Fluoxetine Hcl] Hives, Swelling and Other (See Comments)    Facial swelling    Objective:  General: Alert and oriented x3 in no acute distress  Dermatology: Surgical scars well-healed.  No open lesions bilateral lower extremities, no webspace macerations, no ecchymosis bilateral, all nails x 8 are well manicured with nail polish present short and thickened especially right hallux nail but does appear to be improving compared to prior.  Vascular: Dorsalis Pedis and Posterior Tibial pedal pulses palpable, Capillary Fill Time 3 seconds,(-) pedal hair growth bilateral, no edema bilateral lower extremities, venous hyperpigmentation and varicosities especially on the right, temperature gradient within normal limits.  Neurology: Johney Maine sensation intact via light touch bilateral, unchanged burning sensation right foot and ankle currently on Lyrica  Musculoskeletal: Tenderness palpation at the right ankle at the dorsal lateral aspect history of ankle fracture with also some pain to the plantar fascial insertion at the medial aspect at today's visit.  Severely limited range of motion on right history of severe arthritis.   Problem List Items Addressed This  Visit      Other   Ankle pain    Other Visit Diagnoses    Capsulitis    -  Primary   Traumatic arthritis       History of chronic pain       Plantar fasciitis of right foot       Nail fungus          -Complete examination performed -Re-Discussed treatement options for traumatic arthritis with capsulitis and new onset fasciitis -After oral consent and aseptic prep, injected a mixture containing 1 ml of 2%  plain lidocaine, 1 ml 0.5% plain marcaine, 0.5 ml of kenalog 40 and 0.5 ml of dexamethasone phosphate into right ankle at the anterior lateral and plantar medial heel at the plantar fascial insertion without complication. Post-injection care discussed with patient. This is injection #3 for the year -Continue with orthopedic follow-up for left knee -Continue with pain management follow-up -Continue with good supportive shoes, shoe list and insole recs  was provided and Tri-Lock ankle brace -Continue with Vicks to nail as tolerated for nail fungus issues like previous -Patient to return to office as scheduled in 3 to 4 months or sooner if condition worsens. Landis Martins, DPM

## 2020-01-13 ENCOUNTER — Encounter: Payer: Self-pay | Admitting: Allergy

## 2020-01-13 ENCOUNTER — Other Ambulatory Visit: Payer: Self-pay

## 2020-01-13 ENCOUNTER — Ambulatory Visit (INDEPENDENT_AMBULATORY_CARE_PROVIDER_SITE_OTHER): Payer: Medicare Other | Admitting: Allergy

## 2020-01-13 VITALS — BP 110/80 | HR 60 | Resp 18

## 2020-01-13 DIAGNOSIS — Z720 Tobacco use: Secondary | ICD-10-CM | POA: Diagnosis not present

## 2020-01-13 DIAGNOSIS — J0140 Acute pansinusitis, unspecified: Secondary | ICD-10-CM

## 2020-01-13 DIAGNOSIS — J449 Chronic obstructive pulmonary disease, unspecified: Secondary | ICD-10-CM | POA: Diagnosis not present

## 2020-01-13 DIAGNOSIS — K9049 Malabsorption due to intolerance, not elsewhere classified: Secondary | ICD-10-CM

## 2020-01-13 DIAGNOSIS — J3089 Other allergic rhinitis: Secondary | ICD-10-CM

## 2020-01-13 MED ORDER — DOXYCYCLINE MONOHYDRATE 100 MG PO CAPS: ORAL_CAPSULE | ORAL | 0 refills | Status: DC

## 2020-01-13 MED ORDER — CARBINOXAMINE MALEATE 6 MG PO TABS: ORAL_TABLET | ORAL | 5 refills | Status: DC

## 2020-01-13 MED ORDER — PREDNISONE 10 MG PO TABS
ORAL_TABLET | ORAL | 0 refills | Status: DC
Start: 2020-01-13 — End: 2020-07-27

## 2020-01-13 NOTE — Patient Instructions (Addendum)
Acute sinusitis  - will treat current symptoms with doxycyline 100mg  twice a day x 7 days  - take prednisone 20mg  twice a day x 2 days then 20mg  once a day x 1 day then 10mg  once a day for one day and stop.    Allergies   - continue avoidance measures for grass pollens, dust mite and cockroach.    - allergen avoidance measures discussed/handouts provided   - stop Xyzal.  Start Ryvent 6mg  1 tab twice a day   - continue Singulair daily   - recommend performing nasal saline rinses to help clean/flush out the nose and sinus tract.  This also helps your nasal sprays work better if nose is cleaning.    - for nasal drainage use Astelin/Azelastine 2 sprays each nostril twice a day as needed   - use Flonase 2 sprays each nostril if having nasal congestion.  Use for 1-2 weeks at a time for nasal congestion.     - allergen immunotherapy has been previously discussed including protocol, benefits and risk.  Informational handout provided.  If interested in this therapuetic option you can check with your insurance carrier for coverage.  Let us know if you would like to proceed with this option.    COPD with asthma  - under better control.  Lung function is improved today  - continue Trelegy 244mcg 1 puff daily  - continue Singulair 10mg  daily at bedtime  - have access to albuterol inhaler 2 puffs every 4-6 hours as needed for cough/wheeze/shortness of breath/chest tightness.  May use 15-20 minutes prior to activity.   Monitor frequency of use.    Control goals:   Full participation in all desired activities (may need albuterol before activity)  Albuterol use two time or less a week on average (not counting use with activity)  Cough interfering with sleep two time or less a month  Oral steroids no more than once a year  No hospitalizations  Tobacco use  - continue your course to quitting!  Continue to decrease your daily cigarette use.  Continue on Chantix.   Food intolerance  - skin testing to  milk is negative thus confirming dairy intolerance  - continue avoidance of milk/ice cream to prevent GI symptoms  Follow-up 3-4 months or sooner if needed

## 2020-01-13 NOTE — Progress Notes (Signed)
Follow-up Note  RE: Diana Oneill MRN: [id number] DOB: 18-Sep-1955 Date of Office Visit: 01/13/2020   History of present illness: Diana Oneill is a 64 y.o. female presenting today for follow-up of allergic rhinitis, COPD with asthma and food intolerance. She is also a smoker. She was last seen in the office on 09/23/2019 by myself. She states over the last 3 weeks or so she has been having terrible headaches as well as tenderness and pressure around her face. She points to around her eyes, forehead and her cheeks. She states her symptoms are worsening. Her drainage is increasing and reports that it is terrible. She does have history of sinus infections and this feels like previous infection to her. She is taking Xyzal which at this time is not helping much. She does continue to take Singulair. She is using both Flonase and Astelin without much relief of symptoms currently.   She states that with her current symptoms over the past couple of weeks she has needed to use her albuterol 3-4 times per week. Prior to that she was using it less frequently. She does state that the Trelegy is working much better than the Memory Dance was in controlling her symptoms. She takes the Trelegy once a day. She continues to cut back on her smoking.  She states she has such a habit of lighting the cigarrette and often times with light it and forget it in the ash tray.  She continues to avoid dairy.  Review of systems: Review of Systems  Constitutional: Negative.   HENT:       See HPI  Eyes: Negative.   Respiratory:       See HPI  Cardiovascular: Negative.   Gastrointestinal: Negative.   Musculoskeletal: Negative.   Skin: Negative.   Neurological:       See HPI    All other systems negative unless noted above in HPI  Past medical/social/surgical/family history have been reviewed and are unchanged unless specifically indicated below.  No changes  Medication List: Current Outpatient  Medications  Medication Sig Dispense Refill  . albuterol (VENTOLIN HFA) 108 (90 Base) MCG/ACT inhaler Inhale 2 puffs into the lungs every 6 (six) hours as needed for wheezing or shortness of breath.     Marland Kitchen apixaban (ELIQUIS) 2.5 MG TABS tablet Take 1 tablet (2.5 mg total) by mouth 2 (two) times daily. 60 tablet 0  . Ascorbic Acid (VITAMIN C PO) Take 1,000 mg by mouth daily.     Marland Kitchen azelastine (ASTELIN) 0.1 % nasal spray Place 2 sprays into both nostrils 2 (two) times daily as needed for rhinitis or allergies.     . B Complex Vitamins (VITAMIN B COMPLEX PO) Take 1 tablet by mouth daily.     . CHANTIX 1 MG tablet Take 1 mg by mouth 2 (two) times daily.    . Cholecalciferol (VITAMIN D3) 125 MCG (5000 UT) CAPS Take 5,000 Units by mouth daily.    . COMBIVENT RESPIMAT 20-100 MCG/ACT AERS respimat Inhale 1 puff into the lungs every 6 (six) hours as needed for wheezing or shortness of breath.     . docusate sodium (COLACE) 100 MG capsule Take 1 capsule (100 mg total) by mouth 2 (two) times daily. 60 capsule 0  . ferrous sulfate 325 (65 FE) MG EC tablet Take 325 mg by mouth 3 (three) times daily with meals.    . fluticasone (FLONASE) 50 MCG/ACT nasal spray Place 2 sprays into both nostrils 2 (two) times  daily as needed for allergies.     . Fluticasone-Umeclidin-Vilant (TRELEGY ELLIPTA) 200-62.5-25 MCG/INH AEPB Inhale one dose once daily to prevent cough or wheeze.  Rinse, gargle, and spit after use. (Patient taking differently: Inhale 1 puff into the lungs daily. Inhale one dose once daily to prevent cough or wheeze.  Rinse, gargle, and spit after use.) 60 each 5  . furosemide (LASIX) 40 MG tablet Take 40 mg by mouth daily.     Marland Kitchen gabapentin (NEURONTIN) 300 MG capsule Take 600 mg by mouth 4 (four) times daily.    Marland Kitchen ketoconazole (NIZORAL) 2 % shampoo Apply 1 application topically 2 (two) times a week.     . levocetirizine (XYZAL) 5 MG tablet Take 5 mg by mouth daily as needed for allergies.    Marland Kitchen  lisinopril-hydrochlorothiazide (ZESTORETIC) 10-12.5 MG tablet Take 1 tablet by mouth daily.    . montelukast (SINGULAIR) 10 MG tablet Take 10 mg by mouth daily.    Marland Kitchen nystatin (MYCOSTATIN/NYSTOP) powder Apply 1 application topically daily.    . ondansetron (ZOFRAN) 4 MG tablet Take 1 tablet (4 mg total) by mouth every 6 (six) hours as needed for nausea. 20 tablet 0  . tiZANidine (ZANAFLEX) 4 MG tablet Take 4 mg by mouth at bedtime.     . triamcinolone cream (KENALOG) 0.1 % Apply 1 application topically 2 (two) times daily as needed (itching).     No current facility-administered medications for this visit.     Known medication allergies: Allergies  Allergen Reactions  . Penicillins Anaphylaxis and Other (See Comments)    Has patient had a PCN reaction causing immediate rash, facial/tongue/throat swelling, SOB or lightheadedness with hypotension: Yes Has patient had a PCN reaction causing severe rash involving mucus membranes or skin necrosis: No Has patient had a PCN reaction that required hospitalization: No Has patient had a PCN reaction occurring within the last 10 years: No If all of the above answers are "NO", then may proceed with Cephalosporin use.  . Prozac [Fluoxetine Hcl] Hives, Swelling and Other (See Comments)    Facial swelling     Physical examination: Blood pressure 110/80, pulse 60, resp. rate 18, SpO2 98 %.  General: Alert, interactive, in no acute distress. HEENT: PERRLA, TMs pearly gray, turbinates moderately edematous without discharge, post-pharynx non erythematous. Neck: Supple without lymphadenopathy. Lungs: Clear to auscultation without wheezing, rhonchi or rales. {no increased work of breathing. CV: Normal S1, S2 without murmurs. Abdomen: Nondistended, nontender. Skin: Warm and dry, without lesions or rashes. Extremities:  No clubbing, cyanosis or edema. Neuro:   Grossly intact.  Diagnositics/Labs:  Spirometry: FEV1: 1.65L 64%, FVC: 2.42L 72% predicted.   This is improved from previous study  Assessment and plan:   Acute sinusitis  - will treat current symptoms with doxycyline 100mg  twice a day x 7 days  - take prednisone 20mg  twice a day x 2 days then 20mg  once a day x 1 day then 10mg  once a day for one day and stop.    Allergic rhinitis   - continue avoidance measures for grass pollens, dust mite and cockroach.    - allergen avoidance measures discussed/handouts provided   - stop Xyzal.  Start Ryvent 6mg  1 tab twice a day   - continue Singulair daily   - recommend performing nasal saline rinses to help clean/flush out the nose and sinus tract.  This also helps your nasal sprays work better if nose is cleaning.    - for nasal drainage use Astelin/Azelastine  2 sprays each nostril twice a day as needed   - use Flonase 2 sprays each nostril if having nasal congestion.  Use for 1-2 weeks at a time for nasal congestion.     - allergen immunotherapy has been previously discussed including protocol, benefits and risk.  Informational handout provided.  If interested in this therapuetic option you can check with your insurance carrier for coverage.  Let us know if you would like to proceed with this option.    COPD with asthma  - under better control.  Lung function is improved today  - continue Trelegy 22mcg 1 puff daily  - continue Singulair 10mg  daily at bedtime  - have access to albuterol inhaler 2 puffs every 4-6 hours as needed for cough/wheeze/shortness of breath/chest tightness.  May use 15-20 minutes prior to activity.   Monitor frequency of use.    Control goals:   Full participation in all desired activities (may need albuterol before activity)  Albuterol use two time or less a week on average (not counting use with activity)  Cough interfering with sleep two time or less a month  Oral steroids no more than once a year  No hospitalizations  Tobacco use  - continue your course to quitting!  Continue to decrease your daily  cigarette use.  Continue on Chantix.   Food intolerance  - skin testing to milk is negative thus confirming dairy intolerance  - continue avoidance of milk/ice cream to prevent GI symptoms  Follow-up 3-4 months or sooner if needed I appreciate the opportunity to take part in [place] care. Please do not hesitate to contact me with questions.  Sincerely,   Prudy Feeler, MD Allergy/Immunology [facility] [place]

## 2020-03-24 ENCOUNTER — Other Ambulatory Visit: Payer: Self-pay | Admitting: Allergy

## 2020-04-13 ENCOUNTER — Ambulatory Visit: Payer: Medicare Other | Admitting: Allergy

## 2020-04-14 ENCOUNTER — Ambulatory Visit (INDEPENDENT_AMBULATORY_CARE_PROVIDER_SITE_OTHER): Payer: Medicare Other | Admitting: Sports Medicine

## 2020-04-14 ENCOUNTER — Other Ambulatory Visit: Payer: Self-pay

## 2020-04-14 ENCOUNTER — Encounter: Payer: Self-pay | Admitting: Sports Medicine

## 2020-04-14 DIAGNOSIS — Z87898 Personal history of other specified conditions: Secondary | ICD-10-CM

## 2020-04-14 DIAGNOSIS — M25571 Pain in right ankle and joints of right foot: Secondary | ICD-10-CM

## 2020-04-14 DIAGNOSIS — M1250 Traumatic arthropathy, unspecified site: Secondary | ICD-10-CM | POA: Diagnosis not present

## 2020-04-14 DIAGNOSIS — M722 Plantar fascial fibromatosis: Secondary | ICD-10-CM

## 2020-04-14 DIAGNOSIS — M779 Enthesopathy, unspecified: Secondary | ICD-10-CM

## 2020-04-14 DIAGNOSIS — G8929 Other chronic pain: Secondary | ICD-10-CM

## 2020-04-14 MED ORDER — TRIAMCINOLONE ACETONIDE 10 MG/ML IJ SUSP
10.0000 mg | Freq: Once | INTRAMUSCULAR | Status: AC
  Administered 2020-04-14: 10 mg

## 2020-04-14 NOTE — Progress Notes (Signed)
Subjective: Diana Oneill is a 64 y.o. female patient who returns to office for follow up evaluation of right ankle>heel pain.  Patient requests repeat injection since pain has been so bad that she can not put pressure on the foot. Reports that her orthopedic doctor says that there is nothing more they can do for her arthritis that she has in her knee. No other issues noted.  Patient Active Problem List   Diagnosis Date Noted  . 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 S/P revision of total knee, left 11/07/2019  . Morbid (severe) obesity with alveolar hypoventilation ([place]) 09/12/2019  . Coronavirus infection 04/21/2019  . Preoperative cardiovascular examination 04/18/2019  . Overweight 04/18/2019  . Cigarette smoker 04/18/2019  . Chronic knee pain after total replacement of left knee joint 03/05/2019  . Ankle pain 07/10/2017  . Closed displaced trimalleolar fracture of right ankle 01/05/2017  . Acute encephalopathy 10/25/2016  . Essential hypertension 10/25/2016  . Obesity 10/25/2016  . Acidosis 10/25/2016  . Nausea without vomiting 10/25/2016  . Tachycardia 10/25/2016  . COPD (chronic obstructive pulmonary disease) ([place]) 10/25/2016  . Acute respiratory failure ([place]) 10/25/2016  . Hyponatremia   . Acute kidney injury ([place])   . Anemia   . Thrombocytopenia ([place])   . Osteoarthritis of left knee 10/23/2016  . Bipolar disorder, now depressed ([place]) 08/10/2014  . Cocaine use disorder, mild, in early remission ([place]) 08/10/2014    Current Outpatient Medications on File Prior to Visit  Medication Sig Dispense Refill  . albuterol (VENTOLIN HFA) 108 (90 Base) MCG/ACT inhaler Inhale 2 puffs into the lungs every 6 (six) hours as needed for wheezing or shortness of breath.     . Ascorbic Acid (VITAMIN C PO) Take 1,000 mg by mouth daily.     Marland Kitchen azelastine (ASTELIN) 0.1 % nasal spray Place 2 sprays into both nostrils 2 (two) times daily as needed for rhinitis or allergies.     . B  Complex Vitamins (VITAMIN B COMPLEX PO) Take 1 tablet by mouth daily.     . Carbinoxamine Maleate (RYVENT) 6 MG TABS Take one tablet by mouth twice daily. 60 tablet 5  . Cholecalciferol (VITAMIN D3) 125 MCG (5000 UT) CAPS Take 5,000 Units by mouth daily.    . COMBIVENT RESPIMAT 20-100 MCG/ACT AERS respimat Inhale 1 puff into the lungs every 6 (six) hours as needed for wheezing or shortness of breath.     . docusate sodium (COLACE) 100 MG capsule Take 1 capsule (100 mg total) by mouth 2 (two) times daily. 60 capsule 0  . doxycycline (MONODOX) 100 MG capsule Take one tablet by mouth twice daily for seven days. 14 capsule 0  . ferrous sulfate 325 (65 FE) MG EC tablet Take 325 mg by mouth 3 (three) times daily with meals.    . fluticasone (FLONASE) 50 MCG/ACT nasal spray Place 2 sprays into both nostrils 2 (two) times daily as needed for allergies.     . Fluticasone-Umeclidin-Vilant (TRELEGY ELLIPTA) 200-62.5-25 MCG/INH AEPB INHALE 1 PUFF BY MOUTH ONCE DAILY TO PREVENT COUGH OR WHEEZING. RINSE, GARGLE, AND SPIT AFTER USE 60 each 5  . furosemide (LASIX) 40 MG tablet Take 40 mg by mouth daily.     Marland Kitchen gabapentin (NEURONTIN) 300 MG capsule Take 600 mg by mouth 4 (four) times daily.    Marland Kitchen ketoconazole (NIZORAL) 2 % shampoo Apply 1 application topically 2 (two) times a week.     . levocetirizine (XYZAL) 5 MG tablet Take 5  mg by mouth daily as needed for allergies.    Marland Kitchen lisinopril-hydrochlorothiazide (ZESTORETIC) 10-12.5 MG tablet Take 1 tablet by mouth daily.    . montelukast (SINGULAIR) 10 MG tablet Take 10 mg by mouth daily.    Marland Kitchen nystatin (MYCOSTATIN/NYSTOP) powder Apply 1 application topically daily.    . ondansetron (ZOFRAN) 4 MG tablet Take 1 tablet (4 mg total) by mouth every 6 (six) hours as needed for nausea. 20 tablet 0  . predniSONE (DELTASONE) 10 MG tablet Take two tablets (20mg ) twice daily for two days. Then two tablets (20mg ) once daily for one day.  Then one tablet (10mg ) once daily for one day.  11 tablet 0  . tiZANidine (ZANAFLEX) 4 MG tablet Take 4 mg by mouth at bedtime.     . triamcinolone cream (KENALOG) 0.1 % Apply 1 application topically 2 (two) times daily as needed (itching).     No current facility-administered medications on file prior to visit.    Allergies  Allergen Reactions  . Penicillins Anaphylaxis and Other (See Comments)    Has patient had a PCN reaction causing immediate rash, facial/tongue/throat swelling, SOB or lightheadedness with hypotension: Yes Has patient had a PCN reaction causing severe rash involving mucus membranes or skin necrosis: No Has patient had a PCN reaction that required hospitalization: No Has patient had a PCN reaction occurring within the last 10 years: No If all of the above answers are "NO", then may proceed with Cephalosporin use.  . Prozac [Fluoxetine Hcl] Hives, Swelling and Other (See Comments)    Facial swelling    Objective:  General: Alert and oriented x3 in no acute distress  Dermatology: Surgical scars well-healed.  No open lesions bilateral lower extremities, no webspace macerations, no ecchymosis bilateral, all nails x 8 are well manicured with nail polish present short and thickened especially right hallux nail but does appear to be improving compared to prior with use of Vicks VapoRub.  Vascular: Dorsalis Pedis and Posterior Tibial pedal pulses palpable, Capillary Fill Time 3 seconds,(-) pedal hair growth bilateral, no edema bilateral lower extremities, venous hyperpigmentation and varicosities especially on the right, temperature gradient within normal limits.  Neurology: Johney [place] sensation intact via light touch bilateral, unchanged burning sensation right foot and ankle currently on Lyrica  Musculoskeletal: Tenderness palpation at the right ankle at the dorsal lateral aspect history of ankle fracture with also some pain to the plantar fascial insertion at the medial aspect at today's visit.  Severely limited range of  motion on right history of severe arthritis unchanged from prior.   Problem List Items Addressed This Visit      Other   Ankle pain    Other Visit Diagnoses    Capsulitis    -  Primary   Relevant Medications   triamcinolone acetonide (KENALOG) 10 MG/ML injection 10 mg (Start on 04/14/2020  5:00 PM)   Traumatic arthritis       Relevant Medications   triamcinolone acetonide (KENALOG) 10 MG/ML injection 10 mg (Start on 04/14/2020  5:00 PM)   History of chronic pain       Plantar fasciitis of right foot       Relevant Medications   triamcinolone acetonide (KENALOG) 10 MG/ML injection 10 mg (Start on 04/14/2020  5:00 PM)      -Complete examination performed -Re-Discussed treatement options for traumatic arthritis with capsulitis and fasciitis -After oral consent and aseptic prep, injected a mixture containing 1 ml of 2%  plain lidocaine, 1 ml 0.5% plain  marcaine, 0.5 ml of kenalog 40 and 0.5 ml of dexamethasone phosphate into right ankle and medial heel. -Continue with pain management follow-up -Continue with good supportive shoes, and trilock brace -Patient to return to office as scheduled in 3 to 4 months or sooner if condition worsens. Landis Martins, DPM

## 2020-05-04 ENCOUNTER — Encounter: Payer: Self-pay | Admitting: Allergy

## 2020-05-04 ENCOUNTER — Ambulatory Visit: Payer: Medicare Other | Admitting: Allergy

## 2020-05-04 ENCOUNTER — Ambulatory Visit (INDEPENDENT_AMBULATORY_CARE_PROVIDER_SITE_OTHER): Payer: Medicare Other | Admitting: Allergy

## 2020-05-04 ENCOUNTER — Other Ambulatory Visit: Payer: Self-pay

## 2020-05-04 VITALS — BP 140/82 | HR 60 | Resp 16

## 2020-05-04 DIAGNOSIS — J449 Chronic obstructive pulmonary disease, unspecified: Secondary | ICD-10-CM

## 2020-05-04 DIAGNOSIS — J3089 Other allergic rhinitis: Secondary | ICD-10-CM

## 2020-05-04 DIAGNOSIS — J4489 Other specified chronic obstructive pulmonary disease: Secondary | ICD-10-CM

## 2020-05-04 DIAGNOSIS — Z720 Tobacco use: Secondary | ICD-10-CM | POA: Diagnosis not present

## 2020-05-04 DIAGNOSIS — K9049 Malabsorption due to intolerance, not elsewhere classified: Secondary | ICD-10-CM

## 2020-05-04 NOTE — Progress Notes (Signed)
Follow-up Note  RE: Diana Oneill MRN: [id number] DOB: [birth year] Date of Office Visit: 05/04/2020   History of present illness: Diana Oneill is a 65 y.o. female presenting today for follow-up of allergic rhinitis, COPD with asthma and food intolerance.  She also has a history of tobacco use.  She was last seen in the office on 01/13/2019, myself.  She states she has been doing pretty good since last visit without any major health changes, surgeries or hospitalizations.   She states she has been having runny nose, sinus pressure and headache.  She states the headache she feels is around her eyes and feels like her eyes are puffy.  She states she has been taking Ryvent in the mornings only.  She states she has been taking benadryl at night.  She states she rotates every couple of days between the Astelin nasal spray and Flonase. She also reports that if she does not rinse her mouth out after Trelegy use that she can have a sore throat.  She states sometimes she does forget to rinse afterwards.  She also states she does snore and is unsure if her mouth is getting too dry but she does speak lozenges and water at her bedside overnight. She states she is trying to quit smoking completely.  She is down to 1/2 pack per day.  She states she is needing to have surgery on her shoulder however will not have it done until she has quit smoking.  She states she has been getting steroid injections to the joint.  But she is working to quit smoking so that she can have the surgery. In regards to her COPD with asthma she states she has been doing well and only using her albuterol about 2-3 times a week.  Denies any nighttime symptoms.  She does feel that Trelegy is working well for her.  She takes it 1 puff once a day.  She also continues on singular daily. She continues to avoid milk products in the diet.  Review of systems: Review of Systems  Constitutional: Negative.   HENT:       See HPI   Eyes: Negative.   Respiratory: Negative.   Cardiovascular: Negative.   Gastrointestinal: Negative.   Musculoskeletal: Negative.   Skin: Negative.   Neurological: Negative.     All other systems negative unless noted above in HPI  Past medical/social/surgical/family history have been reviewed and are unchanged unless specifically indicated below.  No changes  Medication List: Current Outpatient Medications  Medication Sig Dispense Refill  . albuterol (VENTOLIN HFA) 108 (90 Base) MCG/ACT inhaler Inhale 2 puffs into the lungs every 6 (six) hours as needed for wheezing or shortness of breath.     . Ascorbic Acid (VITAMIN C PO) Take 1,000 mg by mouth daily.     Marland Kitchen azelastine (ASTELIN) 0.1 % nasal spray Place 2 sprays into both nostrils 2 (two) times daily as needed for rhinitis or allergies.     . B Complex Vitamins (VITAMIN B COMPLEX PO) Take 1 tablet by mouth daily.     . Carbinoxamine Maleate (RYVENT) 6 MG TABS Take one tablet by mouth twice daily. 60 tablet 5  . Cholecalciferol (VITAMIN D3) 125 MCG (5000 UT) CAPS Take 5,000 Units by mouth daily.    . COMBIVENT RESPIMAT 20-100 MCG/ACT AERS respimat Inhale 1 puff into the lungs every 6 (six) hours as needed for wheezing or shortness of breath.     . docusate sodium (COLACE)  100 MG capsule Take 1 capsule (100 mg total) by mouth 2 (two) times daily. 60 capsule 0  . doxycycline (MONODOX) 100 MG capsule Take one tablet by mouth twice daily for seven days. 14 capsule 0  . ferrous sulfate 325 (65 FE) MG EC tablet Take 325 mg by mouth 3 (three) times daily with meals.    . fluticasone (FLONASE) 50 MCG/ACT nasal spray Place 2 sprays into both nostrils 2 (two) times daily as needed for allergies.     . Fluticasone-Umeclidin-Vilant (TRELEGY ELLIPTA) 200-62.5-25 MCG/INH AEPB INHALE 1 PUFF BY MOUTH ONCE DAILY TO PREVENT COUGH OR WHEEZING. RINSE, GARGLE, AND SPIT AFTER USE 60 each 5  . furosemide (LASIX) 40 MG tablet Take 40 mg by mouth daily.     Marland Kitchen  gabapentin (NEURONTIN) 300 MG capsule Take 600 mg by mouth 4 (four) times daily.    Marland Kitchen HYDROcodone-acetaminophen (NORCO/VICODIN) 5-325 MG tablet Take 1 tablet by mouth every 8 (eight) hours.    Marland Kitchen ketoconazole (NIZORAL) 2 % shampoo Apply 1 application topically 2 (two) times a week.     . levocetirizine (XYZAL) 5 MG tablet Take 5 mg by mouth daily as needed for allergies.    Marland Kitchen lisinopril-hydrochlorothiazide (ZESTORETIC) 10-12.5 MG tablet Take 1 tablet by mouth daily.    . montelukast (SINGULAIR) 10 MG tablet Take 10 mg by mouth daily.    Marland Kitchen nystatin (MYCOSTATIN/NYSTOP) powder Apply 1 application topically daily.    . ondansetron (ZOFRAN) 4 MG tablet Take 1 tablet (4 mg total) by mouth every 6 (six) hours as needed for nausea. 20 tablet 0  . oxyCODONE-acetaminophen (PERCOCET/ROXICET) 5-325 MG tablet Take by mouth.    . predniSONE (DELTASONE) 10 MG tablet Take two tablets (20mg ) twice daily for two days. Then two tablets (20mg ) once daily for one day.  Then one tablet (10mg ) once daily for one day. 11 tablet 0  . tiZANidine (ZANAFLEX) 4 MG tablet Take 4 mg by mouth at bedtime.     . triamcinolone cream (KENALOG) 0.1 % Apply 1 application topically 2 (two) times daily as needed (itching).     No current facility-administered medications for this visit.     Known medication allergies: Allergies  Allergen Reactions  . Penicillins Anaphylaxis and Other (See Comments)    Has patient had a PCN reaction causing immediate rash, facial/tongue/throat swelling, SOB or lightheadedness with hypotension: Yes Has patient had a PCN reaction causing severe rash involving mucus membranes or skin necrosis: No Has patient had a PCN reaction that required hospitalization: No Has patient had a PCN reaction occurring within the last 10 years: No If all of the above answers are "NO", then may proceed with Cephalosporin use.  . Prozac [Fluoxetine Hcl] Hives, Swelling and Other (See Comments)    Facial swelling      Physical examination: Blood pressure 140/82, pulse 60, resp. rate 16, SpO2 95 %.  General: Alert, interactive, in no acute distress. HEENT: PERRLA, TMs pearly gray, turbinates mildly edematous without discharge, post-pharynx non erythematous. Neck: Supple without lymphadenopathy. Lungs: Clear to auscultation without wheezing, rhonchi or rales. {no increased work of breathing. CV: Normal S1, S2 without murmurs. Abdomen: Nondistended, nontender. Skin: Warm and dry, without lesions or rashes. Extremities:  No clubbing, cyanosis or edema. Neuro:   Grossly intact.  Diagnositics/Labs: None today  Assessment and plan: Allergic rhinitis   - continue avoidance measures for grass pollens, dust mite and cockroach.    - take Ryvent 6mg  1 tab twice a day.  See if you can avoid using Benadryl at bedtime if the RyVent twice a day is more effective for you   - continue Singulair daily   - recommend performing nasal saline rinses to help clean/flush out the nose and sinus tract.  This also helps your nasal sprays work better if nose is cleaning.    - for nasal drainage use Astelin/Azelastine 2 sprays each nostril twice a day as needed   - use Flonase 2 sprays each nostril daily for the next 1 to 2 weeks for nasal congestion control.  Use for 1-2 weeks at a time if having nasal congestion or sinus pressure.     - allergen immunotherapy has been previously discussed including protocol, benefits and risk.  Informational handout provided.  If interested in this therapuetic option you can check with your insurance carrier for coverage.  Let us know if you would like to proceed with this option.    COPD with asthma  - control is improved  - continue Trelegy 264mcg 1 puff daily.  Rinse/gargle mouth after each use   - continue Singulair 10mg  daily at bedtime  - have access to albuterol inhaler 2 puffs every 4-6 hours as needed for cough/wheeze/shortness of breath/chest tightness.  May use 15-20 minutes  prior to activity.   Monitor frequency of use.    Control goals:   Full participation in all desired activities (may need albuterol before activity)  Albuterol use two time or less a week on average (not counting use with activity)  Cough interfering with sleep two time or less a month  Oral steroids no more than once a year  No hospitalizations  Tobacco use  - continue your course to quitting!  Continue to decrease your daily cigarette use.    Food intolerance  - skin testing to milk has been negative thus confirming dairy intolerance  - continue avoidance of milk/ice cream to prevent GI symptoms  Follow-up 3-4 months or sooner if needed   I appreciate the opportunity to take part in [place] care. Please do not hesitate to contact me with questions.  Sincerely,   Prudy Feeler, MD Allergy/Immunology [facility] [place]

## 2020-05-04 NOTE — Patient Instructions (Addendum)
Allergies   - continue avoidance measures for grass pollens, dust mite and cockroach.    - take Ryvent 6mg  1 tab twice a day.  See if you can avoid using Benadryl at bedtime if the RyVent twice a day is more effective for you   - continue Singulair daily   - recommend performing nasal saline rinses to help clean/flush out the nose and sinus tract.  This also helps your nasal sprays work better if nose is cleaning.    - for nasal drainage use Astelin/Azelastine 2 sprays each nostril twice a day as needed   - use Flonase 2 sprays each nostril daily for the next 1 to 2 weeks for nasal congestion control.  Use for 1-2 weeks at a time if having nasal congestion or sinus pressure.     - allergen immunotherapy has been previously discussed including protocol, benefits and risk.  Informational handout provided.  If interested in this therapuetic option you can check with your insurance carrier for coverage.  Let know if you would like to proceed with this option.    COPD with asthma  - control is improved  - continue Trelegy [place] 1 puff daily.  Rinse/gargle mouth after each use   - continue Singulair 10mg  daily at bedtime  - have access to albuterol inhaler 2 puffs every 4-6 hours as needed for cough/wheeze/shortness of breath/chest tightness.  May use 15-20 minutes prior to activity.   Monitor frequency of use.    Control goals:   Full participation in all desired activities (may need albuterol before activity)  Albuterol use two time or less a week on average (not counting use with activity)  Cough interfering with sleep two time or less a month  Oral steroids no more than once a year  No hospitalizations  Tobacco use  - continue your course to quitting!  Continue to decrease your daily cigarette use.    Food intolerance  - skin testing to milk has been negative thus confirming dairy intolerance  - continue avoidance of milk/ice cream to prevent GI symptoms  Follow-up 3-4 months or  sooner if needed

## 2020-05-07 ENCOUNTER — Ambulatory Visit: Payer: Medicare Other | Admitting: Sports Medicine

## 2020-05-19 DIAGNOSIS — J301 Allergic rhinitis due to pollen: Secondary | ICD-10-CM

## 2020-05-20 DIAGNOSIS — J3089 Other allergic rhinitis: Secondary | ICD-10-CM

## 2020-05-20 NOTE — Progress Notes (Signed)
Aeroallergen Immunotherapy    Patient Details  Name: Diana Oneill  MRN: [id number]  Date of Birth: 1956-03-02   Order 1 of 2   Vial Label: pollen   0.5 ml (Volume) BAU Concentration -- 7 Grass Mix* 100,000 ([address] [place], [place], [place], [place], [place], [place], [place])    0.5 ml Extract Subtotal  4.5 ml Diluent  5.0 ml Maintenance Total    Final Concentration above is stated in weight/volume (wt/vol). Allergen units (AU/ml) biological units (BAU/ml). The total volume is 5 ml.    Schedule: B   Special Instructions: 1 inj/week

## 2020-05-20 NOTE — Addendum Note (Signed)
Addended by: Theresia Lo on: 05/20/2020 11:47 AM   Modules accepted: Orders

## 2020-05-20 NOTE — Progress Notes (Signed)
Aeroallergen Immunotherapy    Patient Details  Name: Diana Oneill  MRN: [id number]  Date of Birth: 16-Aug-1955   Order 2 of 2   Vial Label: cr, mite   0.3 ml (Volume) 1:20 Concentration -- Cockroach, [place]  0.5 ml (Volume)  AU Concentration -- Mite Mix (DF 5,000 & DP 5,000)    0.8 ml Extract Subtotal  4.2 ml Diluent  5.0 ml Maintenance Total    Final Concentration above is stated in weight/volume (wt/vol). Allergen units (AU/ml) biological units (BAU/ml). The total volume is 5 ml.    Schedule: B   Special Instructions: 1 inj/week

## 2020-05-20 NOTE — Progress Notes (Signed)
VIALS EXP 05-20-21

## 2020-06-03 ENCOUNTER — Other Ambulatory Visit: Payer: Self-pay

## 2020-06-03 ENCOUNTER — Ambulatory Visit (INDEPENDENT_AMBULATORY_CARE_PROVIDER_SITE_OTHER): Payer: Medicare Other | Admitting: *Deleted

## 2020-06-03 DIAGNOSIS — J309 Allergic rhinitis, unspecified: Secondary | ICD-10-CM | POA: Diagnosis not present

## 2020-06-03 MED ORDER — EPINEPHRINE 0.3 MG/0.3ML IJ SOAJ
INTRAMUSCULAR | 3 refills | Status: DC
Start: 2020-06-03 — End: 2023-11-07

## 2020-06-03 NOTE — Progress Notes (Signed)
Immunotherapy   Patient Details  Name: Diana Oneill MRN: [id number] Date of Birth: [birth year]  06/03/2020  [name]: POLLEN ; CR/MITE ; 05/20/2021 Following schedule: B   Frequency: WEEKLY Epi-Pen: YES Consent signed and patient instructions given.   Glendell Docker 06/03/2020, 2:42 PM

## 2020-06-08 ENCOUNTER — Ambulatory Visit (INDEPENDENT_AMBULATORY_CARE_PROVIDER_SITE_OTHER): Payer: Medicare Other

## 2020-06-08 DIAGNOSIS — J309 Allergic rhinitis, unspecified: Secondary | ICD-10-CM | POA: Diagnosis not present

## 2020-06-15 ENCOUNTER — Ambulatory Visit (INDEPENDENT_AMBULATORY_CARE_PROVIDER_SITE_OTHER): Payer: Medicare Other

## 2020-06-15 DIAGNOSIS — J309 Allergic rhinitis, unspecified: Secondary | ICD-10-CM

## 2020-06-23 ENCOUNTER — Ambulatory Visit (INDEPENDENT_AMBULATORY_CARE_PROVIDER_SITE_OTHER): Payer: Medicare Other

## 2020-06-23 ENCOUNTER — Ambulatory Visit (INDEPENDENT_AMBULATORY_CARE_PROVIDER_SITE_OTHER): Payer: Medicare Other | Admitting: Sports Medicine

## 2020-06-23 ENCOUNTER — Other Ambulatory Visit: Payer: Self-pay

## 2020-06-23 ENCOUNTER — Encounter: Payer: Self-pay | Admitting: Sports Medicine

## 2020-06-23 DIAGNOSIS — J309 Allergic rhinitis, unspecified: Secondary | ICD-10-CM | POA: Diagnosis not present

## 2020-06-23 DIAGNOSIS — M25571 Pain in right ankle and joints of right foot: Secondary | ICD-10-CM

## 2020-06-23 DIAGNOSIS — G8929 Other chronic pain: Secondary | ICD-10-CM

## 2020-06-23 DIAGNOSIS — M1250 Traumatic arthropathy, unspecified site: Secondary | ICD-10-CM

## 2020-06-23 DIAGNOSIS — Z87898 Personal history of other specified conditions: Secondary | ICD-10-CM | POA: Diagnosis not present

## 2020-06-23 DIAGNOSIS — M7751 Other enthesopathy of right foot: Secondary | ICD-10-CM

## 2020-06-23 DIAGNOSIS — S99921A Unspecified injury of right foot, initial encounter: Secondary | ICD-10-CM

## 2020-06-23 DIAGNOSIS — I739 Peripheral vascular disease, unspecified: Secondary | ICD-10-CM

## 2020-06-23 DIAGNOSIS — M779 Enthesopathy, unspecified: Secondary | ICD-10-CM

## 2020-06-23 MED ORDER — TRIAMCINOLONE ACETONIDE 40 MG/ML IJ SUSP
20.0000 mg | Freq: Once | INTRAMUSCULAR | Status: AC
  Administered 2020-06-23: 20 mg

## 2020-06-23 NOTE — Progress Notes (Signed)
Subjective: Diana Oneill is a 65 y.o. female patient who returns to office for follow up evaluation of right ankle>heel>top of foot pain.  Patient requests repeat injection since previous injections have helped the ankle but feels like her pain is now moving from the lateral side and now is across the entire front and medial side of the ankle and feels worse, reports that her swelling and bruising is getting better after she tripped over her dog last month. No other issues noted.  Patient Active Problem List   Diagnosis Date Noted  . 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 S/P revision of total knee, left 11/07/2019  . Morbid (severe) obesity with alveolar hypoventilation ([place]) 09/12/2019  . Coronavirus infection 04/21/2019  . Preoperative cardiovascular examination 04/18/2019  . Overweight 04/18/2019  . Cigarette smoker 04/18/2019  . Chronic knee pain after total replacement of left knee joint 03/05/2019  . Ankle pain 07/10/2017  . Closed displaced trimalleolar fracture of right ankle 01/05/2017  . Acute encephalopathy 10/25/2016  . Essential hypertension 10/25/2016  . Obesity 10/25/2016  . Acidosis 10/25/2016  . Nausea without vomiting 10/25/2016  . Tachycardia 10/25/2016  . COPD (chronic obstructive pulmonary disease) ([place]) 10/25/2016  . Acute respiratory failure ([place]) 10/25/2016  . Hyponatremia   . Acute kidney injury ([place])   . Anemia   . Thrombocytopenia ([place])   . Osteoarthritis of left knee 10/23/2016  . Bipolar disorder, now depressed ([place]) 08/10/2014  . Cocaine use disorder, mild, in early remission ([place]) 08/10/2014    Current Outpatient Medications on File Prior to Visit  Medication Sig Dispense Refill  . albuterol (VENTOLIN HFA) 108 (90 Base) MCG/ACT inhaler Inhale 2 puffs into the lungs every 6 (six) hours as needed for wheezing or shortness of breath.     . Ascorbic Acid (VITAMIN C PO) Take 1,000 mg by mouth daily.     Marland Kitchen azelastine (ASTELIN) 0.1 % nasal  spray Place 2 sprays into both nostrils 2 (two) times daily as needed for rhinitis or allergies.     . B Complex Vitamins (VITAMIN B COMPLEX PO) Take 1 tablet by mouth daily.     . Carbinoxamine Maleate (RYVENT) 6 MG TABS Take one tablet by mouth twice daily. 60 tablet 5  . Cholecalciferol (VITAMIN D3) 125 MCG (5000 UT) CAPS Take 5,000 Units by mouth daily.    . COMBIVENT RESPIMAT 20-100 MCG/ACT AERS respimat Inhale 1 puff into the lungs every 6 (six) hours as needed for wheezing or shortness of breath.     . docusate sodium (COLACE) 100 MG capsule Take 1 capsule (100 mg total) by mouth 2 (two) times daily. 60 capsule 0  . doxycycline (MONODOX) 100 MG capsule Take one tablet by mouth twice daily for seven days. 14 capsule 0  . EPINEPHrine 0.3 mg/0.3 mL IJ SOAJ injection Use as directed for life threatening allergic reactions only 2 each 3  . ferrous sulfate 325 (65 FE) MG EC tablet Take 325 mg by mouth 3 (three) times daily with meals.    . fluticasone (FLONASE) 50 MCG/ACT nasal spray Place 2 sprays into both nostrils 2 (two) times daily as needed for allergies.     . Fluticasone-Umeclidin-Vilant (TRELEGY ELLIPTA) 200-62.5-25 MCG/INH AEPB INHALE 1 PUFF BY MOUTH ONCE DAILY TO PREVENT COUGH OR WHEEZING. RINSE, GARGLE, AND SPIT AFTER USE 60 each 5  . furosemide (LASIX) 40 MG tablet Take 40 mg by mouth daily.     Marland Kitchen gabapentin (NEURONTIN) 300 MG capsule Take 600  mg by mouth 4 (four) times daily.    Marland Kitchen HYDROcodone-acetaminophen (NORCO/VICODIN) 5-325 MG tablet Take 1 tablet by mouth every 8 (eight) hours.    Marland Kitchen ketoconazole (NIZORAL) 2 % shampoo Apply 1 application topically 2 (two) times a week.     . levocetirizine (XYZAL) 5 MG tablet Take 5 mg by mouth daily as needed for allergies.    Marland Kitchen lisinopril-hydrochlorothiazide (ZESTORETIC) 10-12.5 MG tablet Take 1 tablet by mouth daily.    . montelukast (SINGULAIR) 10 MG tablet Take 10 mg by mouth daily.    Marland Kitchen nystatin (MYCOSTATIN/NYSTOP) powder Apply 1  application topically daily.    . ondansetron (ZOFRAN) 4 MG tablet Take 1 tablet (4 mg total) by mouth every 6 (six) hours as needed for nausea. 20 tablet 0  . oxyCODONE-acetaminophen (PERCOCET/ROXICET) 5-325 MG tablet Take by mouth.    . predniSONE (DELTASONE) 10 MG tablet Take two tablets (20mg ) twice daily for two days. Then two tablets (20mg ) once daily for one day.  Then one tablet (10mg ) once daily for one day. 11 tablet 0  . tiZANidine (ZANAFLEX) 4 MG tablet Take 4 mg by mouth at bedtime.     . triamcinolone cream (KENALOG) 0.1 % Apply 1 application topically 2 (two) times daily as needed (itching).     No current facility-administered medications on file prior to visit.    Allergies  Allergen Reactions  . Penicillins Anaphylaxis and Other (See Comments)    Has patient had a PCN reaction causing immediate rash, facial/tongue/throat swelling, SOB or lightheadedness with hypotension: Yes Has patient had a PCN reaction causing severe rash involving mucus membranes or skin necrosis: No Has patient had a PCN reaction that required hospitalization: No Has patient had a PCN reaction occurring within the last 10 years: No If all of the above answers are "NO", then may proceed with Cephalosporin use.  . Prozac [Fluoxetine Hcl] Hives, Swelling and Other (See Comments)    Facial swelling    Objective:  General: Alert and oriented x3 in no acute distress  Dermatology: Previous surgical scars well-healed.  No open lesions bilateral lower extremities, no webspace macerations, no ecchymosis bilateral, all nails x 8 are well manicured short and thick and currently asymptomatic.  Vascular: Dorsalis Pedis and Posterior Tibial pedal pulses faintly palpable due to swelling, Capillary Fill Time 3 seconds,(-) pedal hair growth bilateral, +1 pitting edema bilateral lower extremities, venous hyperpigmentation and varicosities especially on the right, temperature gradient within normal limits.  Neurology:  Johney Maine sensation intact via light touch bilateral.  Musculoskeletal: Tenderness palpation at the right ankle at the dorsal lateral, anterior, and medial aspect history of ankle fracture with also some pain to the plantar fascial insertion at the medial aspect at today's visit nut the ankle pain is worse.  Severely limited range of motion on right history of severe arthritis unchanged from prior.   Problem List Items Addressed This Visit      Other   Ankle pain    Other Visit Diagnoses    Injury of right foot, initial encounter    -  Primary   Relevant Orders   DG Foot Complete Right (Completed)   Capsulitis       Traumatic arthritis       History of chronic pain       PVD (peripheral vascular disease) (HCC)       Relevant Medications   hydrochlorothiazide (HYDRODIURIL) 25 MG tablet   lisinopril (ZESTRIL) 10 MG tablet      -  Complete examination performed -Xrays consistent with significant arthritis, hardware intact, no new fracture or dislocation -Re-Discussed treatement options for traumatic arthritis with capsulitis and fasciitis -After oral consent and aseptic prep, injected a mixture containing 1 ml of 2%  plain lidocaine, 1 ml 0.5% plain marcaine, 0.5 ml of kenalog 40 and 0.5 ml of dexamethasone phosphate into right ankle  -Applied unna boot to keep intact for 3-5 days for edema control -Dispensed CAM boot for patient to use on the Right to see if this will help with pain since Trilock has been ineffective -Continue with pain management follow-up -Advised patient it may be wise that if her ankle is continue to hurt her to discuss this with the orthopedic doctor that did her original surgery when she fractured her ankle -Patient to return to office as scheduled in 3 to 4 months or sooner if condition worsens. Landis Martins, DPM

## 2020-06-28 ENCOUNTER — Ambulatory Visit (INDEPENDENT_AMBULATORY_CARE_PROVIDER_SITE_OTHER): Payer: Medicare Other | Admitting: *Deleted

## 2020-06-28 DIAGNOSIS — J309 Allergic rhinitis, unspecified: Secondary | ICD-10-CM | POA: Diagnosis not present

## 2020-07-06 ENCOUNTER — Ambulatory Visit (INDEPENDENT_AMBULATORY_CARE_PROVIDER_SITE_OTHER): Payer: Medicare Other

## 2020-07-06 DIAGNOSIS — J309 Allergic rhinitis, unspecified: Secondary | ICD-10-CM | POA: Diagnosis not present

## 2020-07-13 ENCOUNTER — Ambulatory Visit (INDEPENDENT_AMBULATORY_CARE_PROVIDER_SITE_OTHER): Payer: Medicare Other

## 2020-07-13 DIAGNOSIS — J309 Allergic rhinitis, unspecified: Secondary | ICD-10-CM | POA: Diagnosis not present

## 2020-07-20 ENCOUNTER — Ambulatory Visit (INDEPENDENT_AMBULATORY_CARE_PROVIDER_SITE_OTHER): Payer: Medicare Other | Admitting: *Deleted

## 2020-07-20 DIAGNOSIS — J309 Allergic rhinitis, unspecified: Secondary | ICD-10-CM | POA: Diagnosis not present

## 2020-07-27 ENCOUNTER — Ambulatory Visit (INDEPENDENT_AMBULATORY_CARE_PROVIDER_SITE_OTHER): Payer: Medicare Other | Admitting: Sports Medicine

## 2020-07-27 ENCOUNTER — Other Ambulatory Visit: Payer: Self-pay

## 2020-07-27 ENCOUNTER — Encounter: Payer: Self-pay | Admitting: Sports Medicine

## 2020-07-27 ENCOUNTER — Other Ambulatory Visit: Payer: Self-pay | Admitting: Sports Medicine

## 2020-07-27 ENCOUNTER — Ambulatory Visit (INDEPENDENT_AMBULATORY_CARE_PROVIDER_SITE_OTHER): Payer: Medicare Other

## 2020-07-27 DIAGNOSIS — Z87898 Personal history of other specified conditions: Secondary | ICD-10-CM

## 2020-07-27 DIAGNOSIS — M722 Plantar fascial fibromatosis: Secondary | ICD-10-CM | POA: Diagnosis not present

## 2020-07-27 DIAGNOSIS — M79671 Pain in right foot: Secondary | ICD-10-CM

## 2020-07-27 DIAGNOSIS — I739 Peripheral vascular disease, unspecified: Secondary | ICD-10-CM

## 2020-07-27 DIAGNOSIS — J309 Allergic rhinitis, unspecified: Secondary | ICD-10-CM

## 2020-07-27 MED ORDER — TRIAMCINOLONE ACETONIDE 10 MG/ML IJ SUSP
10.0000 mg | Freq: Once | INTRAMUSCULAR | Status: AC
  Administered 2020-07-27: 10 mg

## 2020-07-27 MED ORDER — PREDNISONE 10 MG PO TABS: ORAL_TABLET | ORAL | 0 refills | Status: DC

## 2020-07-27 NOTE — Progress Notes (Signed)
Subjective: Diana Oneill is a 64 y.o. female patient who returns to office for follow up evaluation of now right heel greater than ankle pain.  Patient reports that she also had an episode where her big toe was bothering her and she thought it was gout and now that has slowly gotten better there but her heel has been hurting really badly can barely put pressure on it to walk because there is constant sharp pain that worsens with pressure pain is 9 out of 10.  Patient admits swelling in foot and ankle which is unchanged from prior.  Patient takes hydrocodone for her chronic pain issues but also have tried Epson salts and Biofreeze.  Patient denies any other pedal complaints at this time.  Patient Active Problem List   Diagnosis Date Noted  . Hypermagnesemia 12/24/2019  . 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 S/P revision of total knee, left 11/07/2019  . Sinus bradycardia on ECG 10/20/2019  . Morbid (severe) obesity with alveolar hypoventilation ([place]) 09/12/2019  . Moderate persistent asthma without complication 62/37/6283  . Seasonal and perennial allergic rhinitis 06/23/2019  . Stage 3 chronic kidney disease ([place]) 06/23/2019  . Tobacco use 06/23/2019  . Coronavirus infection 04/21/2019  . Preoperative cardiovascular examination 04/18/2019  . Overweight 04/18/2019  . Cigarette smoker 04/18/2019  . Chronic knee pain after total replacement of left knee joint 03/05/2019  . Ankle pain 07/10/2017  . Closed displaced trimalleolar fracture of right ankle 01/05/2017  . Acute encephalopathy 10/25/2016  . Essential hypertension 10/25/2016  . Obesity 10/25/2016  . Acidosis 10/25/2016  . Nausea without vomiting 10/25/2016  . Tachycardia 10/25/2016  . COPD (chronic obstructive pulmonary disease) ([place]) 10/25/2016  . Acute respiratory failure ([place]) 10/25/2016  . Hyponatremia   . Acute kidney injury ([place])   . Anemia   . Thrombocytopenia ([place])   . Osteoarthritis of left knee  10/23/2016  . Bipolar disorder, now depressed ([place]) 08/10/2014  . Cocaine use disorder, mild, in early remission ([place]) 08/10/2014    Current Outpatient Medications on File Prior to Visit  Medication Sig Dispense Refill  . albuterol (VENTOLIN HFA) 108 (90 Base) MCG/ACT inhaler Inhale 2 puffs into the lungs every 6 (six) hours as needed for wheezing or shortness of breath.     . Ascorbic Acid (VITAMIN C PO) Take 1,000 mg by mouth daily.     Marland Kitchen azelastine (ASTELIN) 0.1 % nasal spray Place 2 sprays into both nostrils 2 (two) times daily as needed for rhinitis or allergies.     . B Complex Vitamins (VITAMIN B COMPLEX PO) Take 1 tablet by mouth daily.     . Carbinoxamine Maleate (RYVENT) 6 MG TABS Take one tablet by mouth twice daily. 60 tablet 5  . Cholecalciferol (VITAMIN D3) 125 MCG (5000 UT) CAPS Take 5,000 Units by mouth daily.    . COMBIVENT RESPIMAT 20-100 MCG/ACT AERS respimat Inhale 1 puff into the lungs every 6 (six) hours as needed for wheezing or shortness of breath.     . docusate sodium (COLACE) 100 MG capsule Take 1 capsule (100 mg total) by mouth 2 (two) times daily. 60 capsule 0  . doxycycline (MONODOX) 100 MG capsule Take one tablet by mouth twice daily for seven days. 14 capsule 0  . doxycycline (VIBRA-TABS) 100 MG tablet Take 100 mg by mouth 2 (two) times daily.    Marland Kitchen EPINEPHrine 0.3 mg/0.3 mL IJ SOAJ injection Use as directed for life threatening allergic reactions only 2 each 3  .  ferrous sulfate 325 (65 FE) MG EC tablet Take 325 mg by mouth 3 (three) times daily with meals.    . fluconazole (DIFLUCAN) 150 MG tablet 1 now by mouth for yeast infection.  Repeat in 1 week.    . fluticasone (FLONASE) 50 MCG/ACT nasal spray Place 2 sprays into both nostrils 2 (two) times daily as needed for allergies.     . Fluticasone-Umeclidin-Vilant (TRELEGY ELLIPTA) 200-62.5-25 MCG/INH AEPB INHALE 1 PUFF BY MOUTH ONCE DAILY TO PREVENT COUGH OR WHEEZING. RINSE, GARGLE, AND SPIT AFTER USE 60 each 5   . furosemide (LASIX) 40 MG tablet Take 40 mg by mouth daily.     Marland Kitchen gabapentin (NEURONTIN) 300 MG capsule Take 600 mg by mouth 4 (four) times daily.    . hydrochlorothiazide (HYDRODIURIL) 25 MG tablet TAKE 1/2 TABLET(12.5 MG) BY MOUTH DAILY    . HYDROcodone-acetaminophen (NORCO/VICODIN) 5-325 MG tablet Take 1 tablet by mouth every 8 (eight) hours.    Marland Kitchen ketoconazole (NIZORAL) 2 % shampoo Apply 1 application topically 2 (two) times a week.     . levocetirizine (XYZAL) 5 MG tablet Take 5 mg by mouth daily as needed for allergies.    Marland Kitchen lisinopril (ZESTRIL) 10 MG tablet Take 1 tablet by mouth daily.    Marland Kitchen lisinopril-hydrochlorothiazide (ZESTORETIC) 10-12.5 MG tablet Take 1 tablet by mouth daily.    . montelukast (SINGULAIR) 10 MG tablet Take 10 mg by mouth daily.    Marland Kitchen NARCAN 4 MG/0.1ML LIQD nasal spray kit USE 1 SPRAY IN NOSE AS DIRECTED    . nystatin (MYCOSTATIN/NYSTOP) powder Apply 1 application topically daily.    . ondansetron (ZOFRAN) 4 MG tablet Take 1 tablet (4 mg total) by mouth every 6 (six) hours as needed for nausea. 20 tablet 0  . oxyCODONE-acetaminophen (PERCOCET/ROXICET) 5-325 MG tablet Take by mouth.    Marland Kitchen tiZANidine (ZANAFLEX) 4 MG tablet Take 4 mg by mouth at bedtime.     . triamcinolone cream (KENALOG) 0.1 % Apply 1 application topically 2 (two) times daily as needed (itching).     No current facility-administered medications on file prior to visit.    Allergies  Allergen Reactions  . Penicillins Anaphylaxis and Other (See Comments)    Has patient had a PCN reaction causing immediate rash, facial/tongue/throat swelling, SOB or lightheadedness with hypotension: Yes Has patient had a PCN reaction causing severe rash involving mucus membranes or skin necrosis: No Has patient had a PCN reaction that required hospitalization: No Has patient had a PCN reaction occurring within the last 10 years: No If all of the above answers are "NO", then may proceed with Cephalosporin use.  .  Prozac [Fluoxetine Hcl] Hives, Swelling and Other (See Comments)    Facial swelling    Objective:  General: Alert and oriented x3 in no acute distress  Dermatology: Previous surgical scars well-healed.  No open lesions bilateral lower extremities, no webspace macerations, no ecchymosis bilateral, all nails x 8 are well manicured short and thick and currently asymptomatic.  Vascular: Dorsalis Pedis and Posterior Tibial pedal pulses faintly palpable due to swelling, Capillary Fill Time 3 seconds,(-) pedal hair growth bilateral, +1 pitting edema bilateral lower extremities, venous hyperpigmentation and varicosities especially on the right with most swelling noted at the right ankle, temperature gradient within normal limits.  Neurology: Johney Maine sensation intact via light touch bilateral.  Musculoskeletal: Tenderness palpation at worst on today's visit at the plantar fascial insertion medial and lateral band and around the perimeter of the plantar aspect  of the right heel.  Patient has unchanged chronic right ankle pain however today the heel hurts worse.  Severely limited range of motion on right history of severe arthritis unchanged from prior.  Patient also has limited range of motion at the right first MPJ.   Problem List Items Addressed This Visit   None   Visit Diagnoses    Plantar fasciitis of right foot    -  Primary   Relevant Medications   triamcinolone acetonide (KENALOG) 10 MG/ML injection 10 mg   History of chronic pain       PVD (peripheral vascular disease) (HCC)          -Complete examination performed -Previous x-rays on file from last month no new x-rays performed this visit -Discussed treatment options for plantar fasciitis -After oral consent and aseptic prep, injected a mixture containing 1 ml of 2%  plain lidocaine, 1 ml 0.5% plain marcaine, 0.5 ml of kenalog 10 and 0.5 ml of dexamethasone phosphate into right heel -Applied heel lift to right shoe -Prescribed  prednisone for additional pain and inflammation -Continue with pain management follow-up -Continue with rest ice elevation -Patient to return to office if fails to continue to improve or sooner if condition worsens. Landis Martins, DPM

## 2020-08-02 ENCOUNTER — Ambulatory Visit (INDEPENDENT_AMBULATORY_CARE_PROVIDER_SITE_OTHER): Payer: Medicare Other | Admitting: *Deleted

## 2020-08-02 DIAGNOSIS — J309 Allergic rhinitis, unspecified: Secondary | ICD-10-CM

## 2020-08-06 ENCOUNTER — Telehealth: Payer: Self-pay

## 2020-08-06 ENCOUNTER — Other Ambulatory Visit: Payer: Self-pay | Admitting: Sports Medicine

## 2020-08-06 DIAGNOSIS — M722 Plantar fascial fibromatosis: Secondary | ICD-10-CM

## 2020-08-06 DIAGNOSIS — M1250 Traumatic arthropathy, unspecified site: Secondary | ICD-10-CM

## 2020-08-06 DIAGNOSIS — G8929 Other chronic pain: Secondary | ICD-10-CM

## 2020-08-06 DIAGNOSIS — Z87898 Personal history of other specified conditions: Secondary | ICD-10-CM

## 2020-08-06 NOTE — Telephone Encounter (Signed)
Pt. Called and LVM stating she recently had a shot in her foot. Pt states the shot is not  Helping she is still having a lot of pain in her ankle and wants to know if an MRI can be done so she can know what's really going on or causing her pain. Please advice

## 2020-08-06 NOTE — Telephone Encounter (Signed)
I sent orders to [facility] they will call her for MRI

## 2020-08-06 NOTE — Progress Notes (Signed)
Rx MRI for right foot and ankle pain chronic

## 2020-08-09 NOTE — Telephone Encounter (Signed)
PT was notified of referral sent to [facility] for MRI

## 2020-08-10 ENCOUNTER — Ambulatory Visit (INDEPENDENT_AMBULATORY_CARE_PROVIDER_SITE_OTHER): Payer: Medicare Other | Admitting: Allergy

## 2020-08-10 ENCOUNTER — Other Ambulatory Visit: Payer: Self-pay

## 2020-08-10 ENCOUNTER — Encounter: Payer: Self-pay | Admitting: Allergy

## 2020-08-10 VITALS — BP 110/68 | HR 54 | Resp 16

## 2020-08-10 DIAGNOSIS — K9049 Malabsorption due to intolerance, not elsewhere classified: Secondary | ICD-10-CM

## 2020-08-10 DIAGNOSIS — J4489 Other specified chronic obstructive pulmonary disease: Secondary | ICD-10-CM

## 2020-08-10 DIAGNOSIS — J449 Chronic obstructive pulmonary disease, unspecified: Secondary | ICD-10-CM | POA: Diagnosis not present

## 2020-08-10 DIAGNOSIS — J309 Allergic rhinitis, unspecified: Secondary | ICD-10-CM

## 2020-08-10 DIAGNOSIS — Z720 Tobacco use: Secondary | ICD-10-CM

## 2020-08-10 DIAGNOSIS — J3089 Other allergic rhinitis: Secondary | ICD-10-CM | POA: Diagnosis not present

## 2020-08-10 MED ORDER — FLUTICASONE PROPIONATE 50 MCG/ACT NA SUSP: 2.0000 | Freq: Two times a day (BID) | NASAL | 5 refills | Status: DC | PRN

## 2020-08-10 NOTE — Patient Instructions (Addendum)
Allergies   - continue avoidance measures for grass pollens, dust mite and cockroach.    - continue Ryvent 6mg  1 tab twice a day.  See if you can avoid using Benadryl at bedtime if the RyVent twice a day is more effective for you   - continue Singulair daily   - recommend performing nasal saline rinses to help clean/flush out the nose and sinus tract.  This also helps your nasal sprays work better if nose is cleaning.    - for nasal drainage use Astelin/Azelastine 2 sprays each nostril twice a day as needed   - use Flonase 2 sprays each nostril daily for the next 1 to 2 weeks for nasal congestion control.  Use for 1-2 weeks at a time if having nasal congestion or sinus pressure.     - continue allergen immunotherapy weekly injections at this time during build-up phase.  Have access to epinephrine device on injections days  COPD with asthma  - control remains improved  - continue Trelegy 260mcg 1 puff daily.  Rinse/gargle mouth after each use   - continue Singulair 10mg  daily at bedtime  - have access to albuterol inhaler 2 puffs every 4-6 hours as needed for cough/wheeze/shortness of breath/chest tightness.  May use 15-20 minutes prior to activity.   Monitor frequency of use.    Control goals:   Full participation in all desired activities (may need albuterol before activity)  Albuterol use two time or less a week on average (not counting use with activity)  Cough interfering with sleep two time or less a month  Oral steroids no more than once a year  No hospitalizations  Tobacco use  - continue your course to quitting!  Continue to decrease your daily cigarette use.    Food intolerance  - skin testing to milk has been negative thus confirming dairy intolerance  - continue avoidance of milk/ice cream to prevent GI symptoms  Follow-up 6 months or sooner if needed

## 2020-08-10 NOTE — Progress Notes (Signed)
Follow-up Note  RE: Diana Oneill MRN: [id number] DOB: Jun 25, 1955 Date of Office Visit: 08/10/2020   History of present illness: Diana Oneill is a 65 y.o. female presenting today for follow-up of allergic rhinitis, COPD with asthma, tobacco use and food intolerance.  She was last seen in the office on 05/04/2020 by myself.  She started on allergen immunotherapy after last visit with weekly doses.  She states she can already tell a difference being on immunotherapy and feels she is less symptomatic.  She is taking ryvent twice a day as well as using both astelin and flonase for nasal drainage and congestion control.   She states her COPD/asthma overlap is also well controlled and she feels Trelegy is working well for her.  She takes 1 puff daily.  She also continues on singulair daily.  She states her albuterol use if quite rare but may need to use if she is been outside more related to pollen exposure.   She is still smoking but states she is still trying to cut back.  She is smoking now about 1/2 pk per day down from 1-1.5 packs per day.  She states she needs to have shoulder surgery but states surgeon will not perform unless she stops smoking.  Thus she is motivated to stop.   She continues to avoid dairy.   Review of systems in the past 4 weeks: Review of Systems  Constitutional: Negative.   HENT: Negative.   Eyes: Negative.   Respiratory: Negative.   Cardiovascular: Negative.   Gastrointestinal: Negative.   Musculoskeletal: Negative.   Skin: Negative.   Neurological: Negative.     All other systems negative unless noted above in HPI  Past medical/social/surgical/family history have been reviewed and are unchanged unless specifically indicated below.  No changes  Medication List: Current Outpatient Medications  Medication Sig Dispense Refill  . albuterol (VENTOLIN HFA) 108 (90 Base) MCG/ACT inhaler Inhale 2 puffs into the lungs every 6 (six) hours as needed  for wheezing or shortness of breath.     . Ascorbic Acid (VITAMIN C PO) Take 1,000 mg by mouth daily.     Marland Kitchen azelastine (ASTELIN) 0.1 % nasal spray Place 2 sprays into both nostrils 2 (two) times daily as needed for rhinitis or allergies.     . B Complex Vitamins (VITAMIN B COMPLEX PO) Take 1 tablet by mouth daily.     . Carbinoxamine Maleate (RYVENT) 6 MG TABS Take one tablet by mouth twice daily. 60 tablet 5  . Cholecalciferol (VITAMIN D3) 125 MCG (5000 UT) CAPS Take 5,000 Units by mouth daily.    . COMBIVENT RESPIMAT 20-100 MCG/ACT AERS respimat Inhale 1 puff into the lungs every 6 (six) hours as needed for wheezing or shortness of breath.     . doxycycline (MONODOX) 100 MG capsule Take one tablet by mouth twice daily for seven days. 14 capsule 0  . EPINEPHrine 0.3 mg/0.3 mL IJ SOAJ injection Use as directed for life threatening allergic reactions only 2 each 3  . ferrous sulfate 325 (65 FE) MG EC tablet Take 325 mg by mouth 3 (three) times daily with meals.    . Fluticasone-Umeclidin-Vilant (TRELEGY ELLIPTA) 200-62.5-25 MCG/INH AEPB INHALE 1 PUFF BY MOUTH ONCE DAILY TO PREVENT COUGH OR WHEEZING. RINSE, GARGLE, AND SPIT AFTER USE 60 each 5  . gabapentin (NEURONTIN) 300 MG capsule Take 600 mg by mouth 4 (four) times daily.    . hydrochlorothiazide (HYDRODIURIL) 25 MG tablet TAKE 1/2 TABLET(12.5  MG) BY MOUTH DAILY    . HYDROcodone-acetaminophen (NORCO/VICODIN) 5-325 MG tablet Take 1 tablet by mouth every 8 (eight) hours.    Marland Kitchen ketoconazole (NIZORAL) 2 % shampoo Apply 1 application topically 2 (two) times a week.     Marland Kitchen lisinopril (ZESTRIL) 10 MG tablet Take 1 tablet by mouth daily.    Marland Kitchen lisinopril-hydrochlorothiazide (ZESTORETIC) 10-12.5 MG tablet Take 1 tablet by mouth daily.    . montelukast (SINGULAIR) 10 MG tablet Take 10 mg by mouth daily.    . Multiple Vitamins-Minerals (ZINC PO) Take by mouth.    Diana Oneill 4 MG/0.1ML LIQD nasal spray kit USE 1 SPRAY IN NOSE AS DIRECTED    . nystatin  (MYCOSTATIN/NYSTOP) powder Apply 1 application topically daily.    Marland Kitchen tiZANidine (ZANAFLEX) 4 MG tablet Take 4 mg by mouth at bedtime.     . triamcinolone cream (KENALOG) 0.1 % Apply 1 application topically 2 (two) times daily as needed (itching).    . fluticasone (FLONASE) 50 MCG/ACT nasal spray Place 2 sprays into both nostrils 2 (two) times daily as needed for allergies. 16 g 5   No current facility-administered medications for this visit.     Known medication allergies: Allergies  Allergen Reactions  . Penicillins Anaphylaxis and Other (See Comments)    Has patient had a PCN reaction causing immediate rash, facial/tongue/throat swelling, SOB or lightheadedness with hypotension: Yes Has patient had a PCN reaction causing severe rash involving mucus membranes or skin necrosis: No Has patient had a PCN reaction that required hospitalization: No Has patient had a PCN reaction occurring within the last 10 years: No If all of the above answers are "NO", then may proceed with Cephalosporin use.  . Prozac [Fluoxetine Hcl] Hives, Swelling and Other (See Comments)    Facial swelling     Physical examination: Blood pressure 110/68, pulse (!) 54, resp. rate 16, SpO2 99 %.  General: Alert, interactive, in no acute distress. HEENT: PERRLA, TMs pearly gray, turbinates minimally edematous without discharge, post-pharynx non erythematous. Neck: Supple without lymphadenopathy. Lungs: Clear to auscultation without wheezing, rhonchi or rales. {no increased work of breathing. CV: Normal S1, S2 without murmurs. Abdomen: Nondistended, nontender. Skin: Warm and dry, without lesions or rashes. Extremities:  No clubbing, cyanosis or edema. Neuro:   Grossly intact.  Diagnositics/Labs:  Spirometry: FEV1: 2.03L 80%, FVC: 2.86L 86%, ratio consistent with nonobstructive pattern  Assessment and plan:   Allergic rhinitis   - continue avoidance measures for grass pollens, dust mite and cockroach.    -  continue Ryvent 40m 1 tab twice a day.  See if you can avoid using Benadryl at bedtime if the RyVent twice a day is more effective for you   - continue Singulair daily   - recommend performing nasal saline rinses to help clean/flush out the nose and sinus tract.  This also helps your nasal sprays work better if nose is cleaning.    - for nasal drainage use Astelin/Azelastine 2 sprays each nostril twice a day as needed   - use Flonase 2 sprays each nostril daily for the next 1 to 2 weeks for nasal congestion control.  Use for 1-2 weeks at a time if having nasal congestion or sinus pressure.     - continue allergen immunotherapy weekly injections at this time during build-up phase.  Have access to epinephrine device on injections days  COPD with asthma  - control remains improved  - continue Trelegy 208m 1 puff daily.  Rinse/gargle mouth  after each use   - continue Singulair 66m daily at bedtime  - have access to albuterol inhaler 2 puffs every 4-6 hours as needed for cough/wheeze/shortness of breath/chest tightness.  May use 15-20 minutes prior to activity.   Monitor frequency of use.    Control goals:   Full participation in all desired activities (may need albuterol before activity)  Albuterol use two time or less a week on average (not counting use with activity)  Cough interfering with sleep two time or less a month  Oral steroids no more than once a year  No hospitalizations  Tobacco use  - continue your course to quitting!  Continue to decrease your daily cigarette use.    Food intolerance  - skin testing to milk has been negative thus confirming dairy intolerance  - continue avoidance of milk/ice cream to prevent GI symptoms  Follow-up 6 months or sooner if needed I appreciate the opportunity to take part in [facility]. Please do not hesitate to contact me with questions.  Sincerely,   SPrudy Feeler MD Allergy/[facility]ology [facility]

## 2020-08-16 ENCOUNTER — Ambulatory Visit (INDEPENDENT_AMBULATORY_CARE_PROVIDER_SITE_OTHER): Payer: Medicare Other | Admitting: *Deleted

## 2020-08-16 DIAGNOSIS — J309 Allergic rhinitis, unspecified: Secondary | ICD-10-CM

## 2020-08-21 ENCOUNTER — Other Ambulatory Visit: Payer: Medicare Other

## 2020-08-24 ENCOUNTER — Ambulatory Visit (INDEPENDENT_AMBULATORY_CARE_PROVIDER_SITE_OTHER): Payer: Medicare Other

## 2020-08-24 DIAGNOSIS — J309 Allergic rhinitis, unspecified: Secondary | ICD-10-CM | POA: Diagnosis not present

## 2020-09-01 ENCOUNTER — Telehealth: Payer: Self-pay

## 2020-09-01 NOTE — Telephone Encounter (Signed)
Pt called and LVM stating she was seen at the ER yesterday due to severe foot pain and not being able to walk or apply any pressure since Friday. Pt also mentioned she dropped something on her foot. Pt would like to know what can be done about her foot until she has her MRI next week. Please advice any instructions.

## 2020-09-02 ENCOUNTER — Ambulatory Visit (INDEPENDENT_AMBULATORY_CARE_PROVIDER_SITE_OTHER): Payer: Medicare Other | Admitting: *Deleted

## 2020-09-02 DIAGNOSIS — J309 Allergic rhinitis, unspecified: Secondary | ICD-10-CM

## 2020-09-02 NOTE — Telephone Encounter (Signed)
Patient was notified of Dr. Leeanne Rio instructions

## 2020-09-08 ENCOUNTER — Ambulatory Visit (INDEPENDENT_AMBULATORY_CARE_PROVIDER_SITE_OTHER): Payer: Medicare Other | Admitting: *Deleted

## 2020-09-08 DIAGNOSIS — J309 Allergic rhinitis, unspecified: Secondary | ICD-10-CM

## 2020-09-08 MED ORDER — TRELEGY ELLIPTA 200-62.5-25 MCG/INH IN AEPB: INHALATION_SPRAY | RESPIRATORY_TRACT | 5 refills | Status: DC

## 2020-09-09 ENCOUNTER — Telehealth: Payer: Self-pay | Admitting: Sports Medicine

## 2020-09-09 ENCOUNTER — Other Ambulatory Visit: Payer: Self-pay | Admitting: Podiatry

## 2020-09-09 MED ORDER — MELOXICAM 7.5 MG PO TABS: 7.5000 mg | ORAL_TABLET | Freq: Every day | ORAL | 0 refills | Status: DC

## 2020-09-09 MED ORDER — METHYLPREDNISOLONE 4 MG PO TBPK: ORAL_TABLET | ORAL | 0 refills | Status: DC

## 2020-09-09 NOTE — Progress Notes (Signed)
PRN acute gout attack RT foot/ankle

## 2020-09-09 NOTE — Telephone Encounter (Signed)
Pt reg refill for prednisone for gout [place]

## 2020-09-11 ENCOUNTER — Other Ambulatory Visit: Payer: Self-pay

## 2020-09-11 ENCOUNTER — Ambulatory Visit
Admission: RE | Admit: 2020-09-11 | Discharge: 2020-09-11 | Disposition: A | Discharge status: Home / Self Care | Payer: Medicare Other | Source: Ambulatory Visit | Attending: Sports Medicine | Admitting: Sports Medicine

## 2020-09-11 DIAGNOSIS — M722 Plantar fascial fibromatosis: Secondary | ICD-10-CM

## 2020-09-11 DIAGNOSIS — G8929 Other chronic pain: Secondary | ICD-10-CM

## 2020-09-11 DIAGNOSIS — M1250 Traumatic arthropathy, unspecified site: Secondary | ICD-10-CM

## 2020-09-11 DIAGNOSIS — Z87898 Personal history of other specified conditions: Secondary | ICD-10-CM

## 2020-09-11 DIAGNOSIS — M25571 Pain in right ankle and joints of right foot: Secondary | ICD-10-CM

## 2020-09-14 ENCOUNTER — Other Ambulatory Visit: Payer: Self-pay

## 2020-09-14 ENCOUNTER — Ambulatory Visit (INDEPENDENT_AMBULATORY_CARE_PROVIDER_SITE_OTHER): Payer: Medicare Other

## 2020-09-14 ENCOUNTER — Ambulatory Visit (INDEPENDENT_AMBULATORY_CARE_PROVIDER_SITE_OTHER): Payer: Medicare Other | Admitting: Sports Medicine

## 2020-09-14 DIAGNOSIS — I739 Peripheral vascular disease, unspecified: Secondary | ICD-10-CM

## 2020-09-14 DIAGNOSIS — M722 Plantar fascial fibromatosis: Secondary | ICD-10-CM | POA: Diagnosis not present

## 2020-09-14 DIAGNOSIS — J309 Allergic rhinitis, unspecified: Secondary | ICD-10-CM | POA: Diagnosis not present

## 2020-09-14 DIAGNOSIS — M84374A Stress fracture, right foot, initial encounter for fracture: Secondary | ICD-10-CM | POA: Diagnosis not present

## 2020-09-14 DIAGNOSIS — Z87898 Personal history of other specified conditions: Secondary | ICD-10-CM

## 2020-09-14 DIAGNOSIS — M25571 Pain in right ankle and joints of right foot: Secondary | ICD-10-CM

## 2020-09-14 DIAGNOSIS — M1250 Traumatic arthropathy, unspecified site: Secondary | ICD-10-CM | POA: Diagnosis not present

## 2020-09-14 DIAGNOSIS — M779 Enthesopathy, unspecified: Secondary | ICD-10-CM

## 2020-09-14 DIAGNOSIS — G8929 Other chronic pain: Secondary | ICD-10-CM

## 2020-09-14 MED ORDER — TRIAMCINOLONE ACETONIDE 10 MG/ML IJ SUSP: 10.0000 mg | Freq: Once | INTRAMUSCULAR | Status: AC

## 2020-09-14 NOTE — Progress Notes (Signed)
Subjective: Diana Oneill is a 65 y.o. female patient who returns to office for follow up evaluation of right foot and ankle pain and for discussion of MRI results.  Patient reports that pain is worse today at the lateral side of her ankle states that her heel pain is not as bad but the pain at the top of the foot is bad.  Reports she has been resting icing elevating and using a topical pain patch but it still hurts and swells up, reports that pain is 8-9 out of 10 sharp in nature with swelling.  Patient denies any other pedal complaints at this time.  Patient Active Problem List   Diagnosis Date Noted  . Hypermagnesemia 12/24/2019  . 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 S/P revision of total knee, left 11/07/2019  . Sinus bradycardia on ECG 10/20/2019  . Morbid (severe) obesity with alveolar hypoventilation ([place]) 09/12/2019  . Moderate persistent asthma without complication 41/74/0814  . Seasonal and perennial allergic rhinitis 06/23/2019  . Stage 3 chronic kidney disease ([place]) 06/23/2019  . Tobacco use 06/23/2019  . Coronavirus infection 04/21/2019  . Preoperative cardiovascular examination 04/18/2019  . Overweight 04/18/2019  . Cigarette smoker 04/18/2019  . Chronic knee pain after total replacement of left knee joint 03/05/2019  . Ankle pain 07/10/2017  . Closed displaced trimalleolar fracture of right ankle 01/05/2017  . Acute encephalopathy 10/25/2016  . Essential hypertension 10/25/2016  . Obesity 10/25/2016  . Acidosis 10/25/2016  . Nausea without vomiting 10/25/2016  . Tachycardia 10/25/2016  . COPD (chronic obstructive pulmonary disease) ([place]) 10/25/2016  . Acute respiratory failure ([place]) 10/25/2016  . Hyponatremia   . Acute kidney injury ([place])   . Anemia   . Thrombocytopenia ([place])   . Osteoarthritis of left knee 10/23/2016  . Bipolar disorder, now depressed ([place]) 08/10/2014  . Cocaine use disorder, mild, in early remission ([place]) 08/10/2014    Current  Outpatient Medications on File Prior to Visit  Medication Sig Dispense Refill  . albuterol (VENTOLIN HFA) 108 (90 Base) MCG/ACT inhaler Inhale 2 puffs into the lungs every 6 (six) hours as needed for wheezing or shortness of breath.     . Ascorbic Acid (VITAMIN C PO) Take 1,000 mg by mouth daily.     Marland Kitchen azelastine (ASTELIN) 0.1 % nasal spray Place 2 sprays into both nostrils 2 (two) times daily as needed for rhinitis or allergies.     . B Complex Vitamins (VITAMIN B COMPLEX PO) Take 1 tablet by mouth daily.     . Carbinoxamine Maleate (RYVENT) 6 MG TABS Take one tablet by mouth twice daily. 60 tablet 5  . Cholecalciferol (VITAMIN D3) 125 MCG (5000 UT) CAPS Take 5,000 Units by mouth daily.    . COMBIVENT RESPIMAT 20-100 MCG/ACT AERS respimat Inhale 1 puff into the lungs every 6 (six) hours as needed for wheezing or shortness of breath.     . doxycycline (MONODOX) 100 MG capsule Take one tablet by mouth twice daily for seven days. 14 capsule 0  . EPINEPHrine 0.3 mg/0.3 mL IJ SOAJ injection Use as directed for life threatening allergic reactions only 2 each 3  . ferrous sulfate 325 (65 FE) MG EC tablet Take 325 mg by mouth 3 (three) times daily with meals.    . fluticasone (FLONASE) 50 MCG/ACT nasal spray Place 2 sprays into both nostrils 2 (two) times daily as needed for allergies. 16 g 5  . Fluticasone-Umeclidin-Vilant (TRELEGY ELLIPTA) 200-62.5-25 MCG/INH AEPB INHALE 1 PUFF BY  MOUTH ONCE DAILY TO PREVENT COUGH OR WHEEZING. RINSE, GARGLE, AND SPIT AFTER USE 60 each 5  . gabapentin (NEURONTIN) 300 MG capsule Take 600 mg by mouth 4 (four) times daily.    . hydrochlorothiazide (HYDRODIURIL) 25 MG tablet TAKE 1/2 TABLET(12.5 MG) BY MOUTH DAILY    . HYDROcodone-acetaminophen (NORCO/VICODIN) 5-325 MG tablet Take 1 tablet by mouth every 8 (eight) hours.    Marland Kitchen ketoconazole (NIZORAL) 2 % shampoo Apply 1 application topically 2 (two) times a week.     Marland Kitchen lisinopril (ZESTRIL) 10 MG tablet Take 1 tablet by mouth  daily.    Marland Kitchen lisinopril-hydrochlorothiazide (ZESTORETIC) 10-12.5 MG tablet Take 1 tablet by mouth daily.    . meloxicam (MOBIC) 7.5 MG tablet Take 1 tablet (7.5 mg total) by mouth daily. 30 tablet 0  . methylPREDNISolone (MEDROL DOSEPAK) 4 MG TBPK tablet 6 day dose pack - take as directed 21 tablet 0  . montelukast (SINGULAIR) 10 MG tablet Take 10 mg by mouth daily.    . Multiple Vitamins-Minerals (ZINC PO) Take by mouth.    Karma Greaser 4 MG/0.1ML LIQD nasal spray kit USE 1 SPRAY IN NOSE AS DIRECTED    . nystatin (MYCOSTATIN/NYSTOP) powder Apply 1 application topically daily.    Marland Kitchen tiZANidine (ZANAFLEX) 4 MG tablet Take 4 mg by mouth at bedtime.     . triamcinolone cream (KENALOG) 0.1 % Apply 1 application topically 2 (two) times daily as needed (itching).     No current facility-administered medications on file prior to visit.    Allergies  Allergen Reactions  . Penicillins Anaphylaxis and Other (See Comments)    Has patient had a PCN reaction causing immediate rash, facial/tongue/throat swelling, SOB or lightheadedness with hypotension: Yes Has patient had a PCN reaction causing severe rash involving mucus membranes or skin necrosis: No Has patient had a PCN reaction that required hospitalization: No Has patient had a PCN reaction occurring within the last 10 years: No If all of the above answers are "NO", then may proceed with Cephalosporin use.  . Prozac [Fluoxetine Hcl] Hives, Swelling and Other (See Comments)    Facial swelling    Objective:  General: Alert and oriented x3 in no acute distress  Dermatology: Previous surgical scars well-healed.  No open lesions bilateral lower extremities, no webspace macerations, no ecchymosis bilateral, all nails x 8 are well manicured short and thick and currently asymptomatic.  Vascular: Dorsalis Pedis and Posterior Tibial pedal pulses faintly palpable due to swelling, Capillary Fill Time 3 seconds,(-) pedal hair growth bilateral, +1 pitting edema  bilateral lower extremities, venous hyperpigmentation and varicosities especially on the right with most swelling noted at the right ankle, temperature gradient within normal limits.  Neurology: Johney Maine sensation intact via light touch bilateral.  Musculoskeletal: Tenderness palpation at worst on today's visit dorsal midfoot greater than right lateral ankle greater than heel.  Patient has unchanged chronic right ankle pain however today the top of the foot and lateral ankle hurts worse.  Severely limited range of motion on right history of severe arthritis unchanged from prior.  Patient also has limited range of motion at the right first and second MPJs with digital contracture supportive of hammertoe deformity.  MRI right ankle IMPRESSION: 1. Since the ankle CT of 3 years ago, the patient has developed advanced tibiotalar degenerative changes and apparent subchondral collapse of the tibial plafond. Underlying posttraumatic deformities of the distal tibia and fibula are otherwise stable status post ORIF. 2. Stress fracture of the 2nd metatarsal,  further described on separate forefoot examination. 3. Prominent posterior spurring of the medial malleolus may partially entrap the posterior tibialis tendon which appears grossly intact. 4. Partial tearing at the calcaneal attachment of the central cord of the plantar fascia. MRI right foot IMPRESSION: 1. Stress fracture of the 2nd metatarsal with associated marrow and surrounding soft tissue edema. 2. No other acute forefoot findings. 3. Mild degenerative changes at the 1st metatarsophalangeal joint. 4. Ankle and hindfoot findings are dictated separately.  Problem List Items Addressed This Visit      Other   Ankle pain    Other Visit Diagnoses    Stress fracture of metatarsal bone of right foot, initial encounter    -  Primary   History of chronic pain       Plantar fasciitis of right foot       Traumatic arthritis       Capsulitis        PVD (peripheral vascular disease) (HCC)          -Complete examination performed -MRI results reviewed -Discussed treatment options for new finding of stress fracture and old findings of arthritis secondary to history of trauma and Planter fasciitis with tearing -After oral consent and aseptic prep, injected a mixture containing 1 ml of 2%  plain lidocaine, 1 ml 0.5% plain marcaine, 0.5 ml of kenalog 10 and 0.5 ml of dexamethasone phosphate into right lateral ankle at area of most pain -Dispensed surgical shoe for patient to wear as directed to offload stress fracture with heel cushion to prevent flareup of plantar fasciitis -Advised patient to reduce or limit use of NSAIDs or steroids that could possibly slow down healing of fracture -Continue with pain management follow-up -Continue with rest ice elevation and topical pain creams or rubs as needed -Dispensed Surgigrip compression sleeve to assist with edema control -Patient to return to office in 1 month for repeat x-ray to further assess stress fracture and compare it to healing on MRI or sooner if condition worsens. Landis Martins, DPM

## 2020-09-20 ENCOUNTER — Ambulatory Visit (INDEPENDENT_AMBULATORY_CARE_PROVIDER_SITE_OTHER): Payer: Medicare Other

## 2020-09-20 DIAGNOSIS — J309 Allergic rhinitis, unspecified: Secondary | ICD-10-CM

## 2020-09-28 ENCOUNTER — Ambulatory Visit (INDEPENDENT_AMBULATORY_CARE_PROVIDER_SITE_OTHER): Payer: Medicare Other

## 2020-09-28 ENCOUNTER — Other Ambulatory Visit: Payer: Self-pay

## 2020-09-28 DIAGNOSIS — J309 Allergic rhinitis, unspecified: Secondary | ICD-10-CM

## 2020-09-28 MED ORDER — ALBUTEROL SULFATE HFA 108 (90 BASE) MCG/ACT IN AERS: INHALATION_SPRAY | RESPIRATORY_TRACT | 1 refills | Status: DC

## 2020-10-01 DIAGNOSIS — R001 Bradycardia, unspecified: Secondary | ICD-10-CM | POA: Diagnosis not present

## 2020-10-04 ENCOUNTER — Ambulatory Visit (INDEPENDENT_AMBULATORY_CARE_PROVIDER_SITE_OTHER): Payer: Medicare Other | Admitting: *Deleted

## 2020-10-04 DIAGNOSIS — J309 Allergic rhinitis, unspecified: Secondary | ICD-10-CM

## 2020-10-11 ENCOUNTER — Ambulatory Visit (INDEPENDENT_AMBULATORY_CARE_PROVIDER_SITE_OTHER): Payer: Medicare Other | Admitting: *Deleted

## 2020-10-11 DIAGNOSIS — J309 Allergic rhinitis, unspecified: Secondary | ICD-10-CM | POA: Diagnosis not present

## 2020-10-13 ENCOUNTER — Encounter: Payer: Self-pay | Admitting: Sports Medicine

## 2020-10-13 ENCOUNTER — Ambulatory Visit (INDEPENDENT_AMBULATORY_CARE_PROVIDER_SITE_OTHER): Payer: Medicare Other

## 2020-10-13 ENCOUNTER — Ambulatory Visit (INDEPENDENT_AMBULATORY_CARE_PROVIDER_SITE_OTHER): Payer: Medicare Other | Admitting: Sports Medicine

## 2020-10-13 ENCOUNTER — Other Ambulatory Visit: Payer: Self-pay

## 2020-10-13 DIAGNOSIS — M779 Enthesopathy, unspecified: Secondary | ICD-10-CM

## 2020-10-13 DIAGNOSIS — M722 Plantar fascial fibromatosis: Secondary | ICD-10-CM

## 2020-10-13 DIAGNOSIS — M25571 Pain in right ankle and joints of right foot: Secondary | ICD-10-CM

## 2020-10-13 DIAGNOSIS — G8929 Other chronic pain: Secondary | ICD-10-CM

## 2020-10-13 DIAGNOSIS — S80819A Abrasion, unspecified lower leg, initial encounter: Secondary | ICD-10-CM

## 2020-10-13 DIAGNOSIS — M84374A Stress fracture, right foot, initial encounter for fracture: Secondary | ICD-10-CM | POA: Diagnosis not present

## 2020-10-13 DIAGNOSIS — Z87898 Personal history of other specified conditions: Secondary | ICD-10-CM

## 2020-10-13 DIAGNOSIS — I739 Peripheral vascular disease, unspecified: Secondary | ICD-10-CM

## 2020-10-13 DIAGNOSIS — M1250 Traumatic arthropathy, unspecified site: Secondary | ICD-10-CM

## 2020-10-13 MED ORDER — TRIAMCINOLONE ACETONIDE 10 MG/ML IJ SUSP
10.0000 mg | Freq: Once | INTRAMUSCULAR | Status: AC
  Administered 2020-10-13: 10 mg

## 2020-10-13 NOTE — Progress Notes (Signed)
Subjective: Diana Oneill is a 65 y.o. female patient who returns to office for follow up evaluation of right foot and ankle pain.  Patient reports that the foot is feeling better the only thing that is currently hurting at her ankle reports that the medial side is hurting more than the lateral side and states that she has been noticing a lot of swelling and discoloration of her skin on the right lower extremity as well as a small scrape on the lateral leg may have gotten it by walking through a thorn bush.  Patient denies any other pedal complaints at this time.  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   S/P revision of total knee, left 11/07/2019   Sinus bradycardia on ECG 10/20/2019   Morbid (severe) obesity with alveolar hypoventilation ([place]) 09/12/2019   Moderate persistent asthma without complication 52/84/1324   Seasonal and perennial allergic rhinitis 06/23/2019   Stage 3 chronic kidney disease ([place]) 06/23/2019   Tobacco use 06/23/2019   Coronavirus infection 04/21/2019   Preoperative cardiovascular examination 04/18/2019   Overweight 04/18/2019   Cigarette smoker 04/18/2019   Chronic knee pain after total replacement of left knee joint 03/05/2019   Ankle pain 07/10/2017   Closed displaced trimalleolar fracture of right ankle 01/05/2017   Acute encephalopathy 10/25/2016   Essential hypertension 10/25/2016   Obesity 10/25/2016   Acidosis 10/25/2016   Nausea without vomiting 10/25/2016   Tachycardia 10/25/2016   COPD (chronic obstructive pulmonary disease) ([place]) 10/25/2016   Acute respiratory failure ([place]) 10/25/2016   Hyponatremia    Acute kidney injury ([place])    Anemia    Thrombocytopenia ([place])    Osteoarthritis of left knee 10/23/2016   Bipolar disorder, now depressed ([place]) 08/10/2014   Cocaine use disorder, mild, in early remission ([place]) 08/10/2014    Current Outpatient  Medications on File Prior to Visit  Medication Sig Dispense Refill   albuterol (VENTOLIN HFA) 108 (90 Base) MCG/ACT inhaler Can inhale two puffs every four to six hours as needed for cough/wheeze/shortness of breath/chest tightness. . 1 each 1   Ascorbic Acid (VITAMIN C PO) Take 1,000 mg by mouth daily.      azelastine (ASTELIN) 0.1 % nasal spray Place 2 sprays into both nostrils 2 (two) times daily as needed for rhinitis or allergies.      B Complex Vitamins (VITAMIN B COMPLEX PO) Take 1 tablet by mouth daily.      Carbinoxamine Maleate (RYVENT) 6 MG TABS Take one tablet by mouth twice daily. 60 tablet 5   Cholecalciferol (VITAMIN D3) 125 MCG (5000 UT) CAPS Take 5,000 Units by mouth daily.     COMBIVENT RESPIMAT 20-100 MCG/ACT AERS respimat Inhale 1 puff into the lungs every 6 (six) hours as needed for wheezing or shortness of breath.      doxycycline (MONODOX) 100 MG capsule Take one tablet by mouth twice daily for seven days. 14 capsule 0   EPINEPHrine 0.3 mg/0.3 mL IJ SOAJ injection Use as directed for life threatening allergic reactions only 2 each 3   ferrous sulfate 325 (65 FE) MG EC tablet Take 325 mg by mouth 3 (three) times daily with meals.     fluticasone (FLONASE) 50 MCG/ACT nasal spray Place 2 sprays into both nostrils 2 (two) times daily as needed for allergies. 16 g 5   Fluticasone-Umeclidin-Vilant (TRELEGY ELLIPTA) 200-62.5-25 MCG/INH AEPB INHALE 1 PUFF BY MOUTH ONCE DAILY TO  PREVENT COUGH OR WHEEZING. RINSE, GARGLE, AND SPIT AFTER USE 60 each 5   gabapentin (NEURONTIN) 300 MG capsule Take 600 mg by mouth 4 (four) times daily.     hydrochlorothiazide (HYDRODIURIL) 25 MG tablet TAKE 1/2 TABLET(12.5 MG) BY MOUTH DAILY     HYDROcodone-acetaminophen (NORCO/VICODIN) 5-325 MG tablet Take 1 tablet by mouth every 8 (eight) hours.     ketoconazole (NIZORAL) 2 % shampoo Apply 1 application topically 2 (two) times a week.      lisinopril (ZESTRIL) 10 MG tablet Take 1 tablet by mouth daily.      lisinopril-hydrochlorothiazide (ZESTORETIC) 10-12.5 MG tablet Take 1 tablet by mouth daily.     meloxicam (MOBIC) 7.5 MG tablet Take 1 tablet (7.5 mg total) by mouth daily. 30 tablet 0   methylPREDNISolone (MEDROL DOSEPAK) 4 MG TBPK tablet 6 day dose pack - take as directed 21 tablet 0   montelukast (SINGULAIR) 10 MG tablet Take 10 mg by mouth daily.     Multiple Vitamins-Minerals (ZINC PO) Take by mouth.     NARCAN 4 MG/0.1ML LIQD nasal spray kit USE 1 SPRAY IN NOSE AS DIRECTED     nystatin (MYCOSTATIN/NYSTOP) powder Apply 1 application topically daily.     tiZANidine (ZANAFLEX) 4 MG tablet Take 4 mg by mouth at bedtime.      triamcinolone cream (KENALOG) 0.1 % Apply 1 application topically 2 (two) times daily as needed (itching).     Current Facility-Administered Medications on File Prior to Visit  Medication Dose Route Frequency Provider Last Rate Last Admin   triamcinolone acetonide (KENALOG) 10 MG/ML injection 10 mg  10 mg Other Once Landis Martins, DPM        Allergies  Allergen Reactions   Penicillins Anaphylaxis and Other (See Comments)    Has patient had a PCN reaction causing immediate rash, facial/tongue/throat swelling, SOB or lightheadedness with hypotension: Yes Has patient had a PCN reaction causing severe rash involving mucus membranes or skin necrosis: No Has patient had a PCN reaction that required hospitalization: No Has patient had a PCN reaction occurring within the last 10 years: No If all of the above answers are "NO", then may proceed with Cephalosporin use.   Prozac [Fluoxetine Hcl] Hives, Swelling and Other (See Comments)    Facial swelling    Objective:  General: Alert and oriented x3 in no acute distress  Dermatology: Previous surgical scars well-healed.  No open lesions bilateral lower extremities, no webspace macerations, no ecchymosis bilateral, all nails x 8 are well manicured short and thick and currently asymptomatic.  Small abrasion noted to the  right lateral leg with no surrounding signs of infection has a dry scab over the area.  Vascular: Dorsalis Pedis and Posterior Tibial pedal pulses faintly palpable due to swelling, Capillary Fill Time 3 seconds,(-) pedal hair growth bilateral, +1 pitting edema bilateral lower extremities, venous hyperpigmentation and varicosities especially on the right with most swelling noted at the right ankle unchanged from prior consistent with PVD, temperature gradient within normal limits.  Neurology: Johney Maine sensation intact via light touch bilateral.  Musculoskeletal: Tenderness palpation at worst on today's visit worse at the medial greater than lateral ankle and very minimal pain to the foot at area of fracture.  Severely limited range of motion on right history of severe arthritis unchanged from prior.  Patient also has limited range of motion at the right first and second MPJs with digital contracture supportive of hammertoe deformity.  X-ray right foot midshaft fracture of the  second metatarsal with minimal displacement appears to be healing with osseous calcification hardware intact at ankle with history of severe arthritis no other acute findings  Problem List Items Addressed This Visit       Other   Ankle pain   Other Visit Diagnoses     Stress fracture of metatarsal bone of right foot, initial encounter    -  Primary   Relevant Orders   DG Foot Complete Right   Plantar fasciitis of right foot       Relevant Orders   DG Foot Complete Right   History of chronic pain       Traumatic arthritis       Capsulitis           -Complete examination performed -X-rays reviewed reviewed -Discussed continued care for stress fracture second metatarsal -Advised patient to continue with limited weightbearing with surgical shoe to prevent worsening of fracture and to allow more time for healing -Discussed continued care for chronic ankle pain -Patient requesting injection this visit made patient well  aware that we have to limit excessive use of steroids however at this time we will attempt to inject at the medial ankle since this is the area that is most painful -After oral consent and aseptic prep, injected a mixture containing 1 ml of 2% plain lidocaine, 1 ml 0.5% plain marcaine, 0.5 ml of kenalog 10 and 0.5 ml of dexamethasone phosphate into right medial ankle at area of most pain -Applied Ace wrap to right lower extremity for patient to use for pain and swelling -Continue with pain management follow-up -Continue with rest ice elevation and topical pain creams or rubs as needed -Discussed treatment options for abrasion to right leg advised patient to apply a small amount of Neosporin to the area for the next few days until the scab has fallen off if worsens return to office -Patient to return to office in 4 to 6 weeks for repeat x-ray to further assess stress fracture or sooner if condition worsens  -Patient is scheduled to have shoulder/rotator cuff surgery on Friday Landis Martins, DPM

## 2020-10-18 ENCOUNTER — Ambulatory Visit (INDEPENDENT_AMBULATORY_CARE_PROVIDER_SITE_OTHER): Payer: Medicare Other | Admitting: *Deleted

## 2020-10-18 DIAGNOSIS — J309 Allergic rhinitis, unspecified: Secondary | ICD-10-CM

## 2020-10-25 ENCOUNTER — Ambulatory Visit (INDEPENDENT_AMBULATORY_CARE_PROVIDER_SITE_OTHER): Payer: Medicare Other | Admitting: *Deleted

## 2020-10-25 DIAGNOSIS — J309 Allergic rhinitis, unspecified: Secondary | ICD-10-CM | POA: Diagnosis not present

## 2020-10-29 ENCOUNTER — Other Ambulatory Visit: Payer: Self-pay | Admitting: Allergy

## 2020-11-01 ENCOUNTER — Other Ambulatory Visit: Payer: Self-pay | Admitting: Allergy

## 2020-11-02 ENCOUNTER — Ambulatory Visit (INDEPENDENT_AMBULATORY_CARE_PROVIDER_SITE_OTHER): Payer: Medicare Other | Admitting: *Deleted

## 2020-11-02 DIAGNOSIS — J309 Allergic rhinitis, unspecified: Secondary | ICD-10-CM | POA: Diagnosis not present

## 2020-11-02 NOTE — Telephone Encounter (Signed)
This is a duplicate was last sent in 11/02/20 with 1 refill

## 2020-11-15 ENCOUNTER — Telehealth: Payer: Self-pay | Admitting: Allergy

## 2020-11-15 NOTE — Telephone Encounter (Signed)
Left message for patient to call the office

## 2020-11-15 NOTE — Telephone Encounter (Signed)
Patient states she recently developed pneumonia and was put on 4 different medications but has ran out. She is wanting to speak with a nurse regarding what is recommended for her to do or take.

## 2020-11-16 NOTE — Telephone Encounter (Signed)
Called patient again and patient is actually at [facility] now.  Per patient, looks like pneumonia has started to clear up but now diagnosed with RSV.  I told her to let us know when she gets out of the hospital and we can schedule her for a follow-up visit with Dr. Nelva Bush if needed.  Patient agreed with plan.  Told her we are here if she needs [place].

## 2020-11-23 ENCOUNTER — Telehealth: Payer: Self-pay | Admitting: Sports Medicine

## 2020-11-23 NOTE — Telephone Encounter (Signed)
Just FYI Pt called to let you know her Kidney dr put her on allupurinol and her foot is much better.

## 2020-11-24 ENCOUNTER — Ambulatory Visit: Payer: Medicare Other | Admitting: Sports Medicine

## 2020-11-24 ENCOUNTER — Telehealth: Payer: Self-pay | Admitting: Allergy

## 2020-11-24 NOTE — Telephone Encounter (Signed)
Dr. Padgett, please advise.  

## 2020-11-24 NOTE — Telephone Encounter (Signed)
Patient is requesting a form stating that she has respiratory problems and cannot be out in the heat. Therefore, she needs her A/C to remain on. She states this form needs to be sent to Brendolyn Patty at [employer] Her fax number is [phone].

## 2020-11-25 ENCOUNTER — Encounter: Payer: Self-pay | Admitting: *Deleted

## 2020-11-25 NOTE — Telephone Encounter (Signed)
Letter created and faxed to number below.

## 2020-11-29 ENCOUNTER — Ambulatory Visit (INDEPENDENT_AMBULATORY_CARE_PROVIDER_SITE_OTHER): Payer: Medicare Other | Admitting: *Deleted

## 2020-11-29 DIAGNOSIS — J309 Allergic rhinitis, unspecified: Secondary | ICD-10-CM

## 2020-12-02 ENCOUNTER — Other Ambulatory Visit: Payer: Self-pay | Admitting: Allergy

## 2020-12-06 ENCOUNTER — Ambulatory Visit (INDEPENDENT_AMBULATORY_CARE_PROVIDER_SITE_OTHER): Payer: Medicare Other | Admitting: *Deleted

## 2020-12-06 DIAGNOSIS — J309 Allergic rhinitis, unspecified: Secondary | ICD-10-CM

## 2020-12-15 ENCOUNTER — Ambulatory Visit (INDEPENDENT_AMBULATORY_CARE_PROVIDER_SITE_OTHER): Payer: Medicare Other | Admitting: *Deleted

## 2020-12-15 DIAGNOSIS — J309 Allergic rhinitis, unspecified: Secondary | ICD-10-CM | POA: Diagnosis not present

## 2020-12-22 ENCOUNTER — Ambulatory Visit (INDEPENDENT_AMBULATORY_CARE_PROVIDER_SITE_OTHER): Payer: Medicare Other | Admitting: Sports Medicine

## 2020-12-22 ENCOUNTER — Other Ambulatory Visit: Payer: Self-pay

## 2020-12-22 ENCOUNTER — Ambulatory Visit (INDEPENDENT_AMBULATORY_CARE_PROVIDER_SITE_OTHER): Payer: Medicare Other | Admitting: *Deleted

## 2020-12-22 ENCOUNTER — Encounter: Payer: Self-pay | Admitting: Sports Medicine

## 2020-12-22 ENCOUNTER — Ambulatory Visit (INDEPENDENT_AMBULATORY_CARE_PROVIDER_SITE_OTHER): Payer: Medicare Other

## 2020-12-22 DIAGNOSIS — S99921A Unspecified injury of right foot, initial encounter: Secondary | ICD-10-CM

## 2020-12-22 DIAGNOSIS — I739 Peripheral vascular disease, unspecified: Secondary | ICD-10-CM

## 2020-12-22 DIAGNOSIS — M1250 Traumatic arthropathy, unspecified site: Secondary | ICD-10-CM | POA: Diagnosis not present

## 2020-12-22 DIAGNOSIS — S99921D Unspecified injury of right foot, subsequent encounter: Secondary | ICD-10-CM

## 2020-12-22 DIAGNOSIS — M25571 Pain in right ankle and joints of right foot: Secondary | ICD-10-CM

## 2020-12-22 DIAGNOSIS — J309 Allergic rhinitis, unspecified: Secondary | ICD-10-CM | POA: Diagnosis not present

## 2020-12-22 DIAGNOSIS — G8929 Other chronic pain: Secondary | ICD-10-CM

## 2020-12-22 DIAGNOSIS — M779 Enthesopathy, unspecified: Secondary | ICD-10-CM | POA: Diagnosis not present

## 2020-12-22 DIAGNOSIS — M84374D Stress fracture, right foot, subsequent encounter for fracture with routine healing: Secondary | ICD-10-CM

## 2020-12-22 DIAGNOSIS — M84374A Stress fracture, right foot, initial encounter for fracture: Secondary | ICD-10-CM

## 2020-12-22 DIAGNOSIS — Z8739 Personal history of other diseases of the musculoskeletal system and connective tissue: Secondary | ICD-10-CM

## 2020-12-22 MED ORDER — TRIAMCINOLONE ACETONIDE 10 MG/ML IJ SUSP
10.0000 mg | Freq: Once | INTRAMUSCULAR | Status: AC
  Administered 2020-12-22: 10 mg

## 2020-12-22 NOTE — Progress Notes (Signed)
VIALS MADE. EXP 12-22-21

## 2020-12-22 NOTE — Progress Notes (Signed)
Subjective: Diana Oneill is a 65 y.o. female patient who returns to office for follow up evaluation of right foot and ankle pain.  Patient reports that the foot is feeling better only has pain at the ankle states that also her doctor put her on gout medicine and that has been helping as well.  No other pedal complaints noted.  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   S/P revision of total knee, left 11/07/2019   Sinus bradycardia on ECG 10/20/2019   Morbid (severe) obesity with alveolar hypoventilation ([place]) 09/12/2019   Moderate persistent asthma without complication 80/16/5537   Seasonal and perennial allergic rhinitis 06/23/2019   Stage 3 chronic kidney disease ([place]) 06/23/2019   Tobacco use 06/23/2019   Coronavirus infection 04/21/2019   Preoperative cardiovascular examination 04/18/2019   Overweight 04/18/2019   Cigarette smoker 04/18/2019   Chronic knee pain after total replacement of left knee joint 03/05/2019   Ankle pain 07/10/2017   Closed displaced trimalleolar fracture of right ankle 01/05/2017   Acute encephalopathy 10/25/2016   Essential hypertension 10/25/2016   Obesity 10/25/2016   Acidosis 10/25/2016   Nausea without vomiting 10/25/2016   Tachycardia 10/25/2016   COPD (chronic obstructive pulmonary disease) ([place]) 10/25/2016   Acute respiratory failure ([place]) 10/25/2016   Hyponatremia    Acute kidney injury ([place])    Anemia    Thrombocytopenia ([place])    Osteoarthritis of left knee 10/23/2016   Bipolar disorder, now depressed ([place]) 08/10/2014   Cocaine use disorder, mild, in early remission ([place]) 08/10/2014    Current Outpatient Medications on File Prior to Visit  Medication Sig Dispense Refill   albuterol (VENTOLIN HFA) 108 (90 Base) MCG/ACT inhaler INHALE 2 PUFFS BY MOUTH EVERY 4 TO 6 HOURS AS NEEDED FOR COUGH, WHEEZING, SHORTNESS OF BREATH OR CHEST TIGHTNESS 8.5 g 1    Ascorbic Acid (VITAMIN C PO) Take 1,000 mg by mouth daily.      azelastine (ASTELIN) 0.1 % nasal spray Place 2 sprays into both nostrils 2 (two) times daily as needed for rhinitis or allergies.      B Complex Vitamins (VITAMIN B COMPLEX PO) Take 1 tablet by mouth daily.      Cholecalciferol (VITAMIN D3) 125 MCG (5000 UT) CAPS Take 5,000 Units by mouth daily.     COMBIVENT RESPIMAT 20-100 MCG/ACT AERS respimat Inhale 1 puff into the lungs every 6 (six) hours as needed for wheezing or shortness of breath.      doxycycline (MONODOX) 100 MG capsule Take one tablet by mouth twice daily for seven days. 14 capsule 0   EPINEPHrine 0.3 mg/0.3 mL IJ SOAJ injection Use as directed for life threatening allergic reactions only 2 each 3   ferrous sulfate 325 (65 FE) MG EC tablet Take 325 mg by mouth 3 (three) times daily with meals.     fluticasone (FLONASE) 50 MCG/ACT nasal spray Place 2 sprays into both nostrils 2 (two) times daily as needed for allergies. 16 g 5   Fluticasone-Umeclidin-Vilant (TRELEGY ELLIPTA) 200-62.5-25 MCG/INH AEPB INHALE 1 PUFF BY MOUTH ONCE DAILY TO PREVENT COUGH OR WHEEZING. RINSE, GARGLE, AND SPIT AFTER USE 60 each 5   gabapentin (NEURONTIN) 300 MG capsule Take 600 mg by mouth 4 (four) times daily.     hydrochlorothiazide (HYDRODIURIL) 25 MG tablet TAKE 1/2 TABLET(12.5 MG) BY MOUTH DAILY     HYDROcodone-acetaminophen (NORCO/VICODIN) 5-325 MG tablet Take 1 tablet by  mouth every 8 (eight) hours.     ketoconazole (NIZORAL) 2 % shampoo Apply 1 application topically 2 (two) times a week.      lisinopril (ZESTRIL) 10 MG tablet Take 1 tablet by mouth daily.     lisinopril-hydrochlorothiazide (ZESTORETIC) 10-12.5 MG tablet Take 1 tablet by mouth daily.     meloxicam (MOBIC) 7.5 MG tablet Take 1 tablet (7.5 mg total) by mouth daily. 30 tablet 0   methylPREDNISolone (MEDROL DOSEPAK) 4 MG TBPK tablet 6 day dose pack - take as directed 21 tablet 0   montelukast (SINGULAIR) 10 MG tablet Take 10 mg  by mouth daily.     Multiple Vitamins-Minerals (ZINC PO) Take by mouth.     NARCAN 4 MG/0.1ML LIQD nasal spray kit USE 1 SPRAY IN NOSE AS DIRECTED     nystatin (MYCOSTATIN/NYSTOP) powder Apply 1 application topically daily.     RYVENT 6 MG TABS TAKE 1 TABLET BY MOUTH TWICE DAILY 60 tablet 5   tiZANidine (ZANAFLEX) 4 MG tablet Take 4 mg by mouth at bedtime.      triamcinolone cream (KENALOG) 0.1 % Apply 1 application topically 2 (two) times daily as needed (itching).     Current Facility-Administered Medications on File Prior to Visit  Medication Dose Route Frequency Provider Last Rate Last Admin   triamcinolone acetonide (KENALOG) 10 MG/ML injection 10 mg  10 mg Other Once Landis Martins, DPM        Allergies  Allergen Reactions   Penicillins Anaphylaxis and Other (See Comments)    Has patient had a PCN reaction causing immediate rash, facial/tongue/throat swelling, SOB or lightheadedness with hypotension: Yes Has patient had a PCN reaction causing severe rash involving mucus membranes or skin necrosis: No Has patient had a PCN reaction that required hospitalization: No Has patient had a PCN reaction occurring within the last 10 years: No If all of the above answers are "NO", then may proceed with Cephalosporin use.   Prozac [Fluoxetine Hcl] Hives, Swelling and Other (See Comments)    Facial swelling    Objective:  General: Alert and oriented x3 in no acute distress  Dermatology: Previous surgical scars well-healed.  No open lesions bilateral lower extremities, no webspace macerations, no ecchymosis bilateral, all nails x 8 are well manicured short and thick and currently asymptomatic.  Vascular: Dorsalis Pedis and Posterior Tibial pedal pulses faintly palpable due to swelling, Capillary Fill Time 3 seconds,(-) pedal hair growth bilateral, +1 pitting edema bilateral lower extremities, venous hyperpigmentation and varicosities especially on the right with most swelling noted at the  right ankle unchanged from prior consistent with PVD, temperature gradient within normal limits.  Neurology: Johney Maine sensation intact via light touch bilateral.  Musculoskeletal: Tenderness palpation at worst on today's visit worse at the medial greater than lateral ankle and very minimal pain to the foot at area of fracture at the forefoot on the right.  Severely limited range of motion on right history of severe arthritis unchanged from prior.  Patient also has limited range of motion at the right first and second MPJs with digital contracture supportive of hammertoe deformity.  X-ray right foot midshaft fracture of the second metatarsal and third metatarsal of the neck with minimal displacement appears to be healing with osseous calcification hardware intact at ankle with history of severe arthritis no other acute findings  Problem List Items Addressed This Visit       Other   Ankle pain   Other Visit Diagnoses  Stress fracture of metatarsal bone of right foot with routine healing, subsequent encounter    -  Primary   Relevant Orders   DG Foot Complete Right   Traumatic arthritis       Relevant Orders   DG Foot Complete Right   Injury of right foot, subsequent encounter       Relevant Orders   DG Foot Complete Right   Capsulitis       PVD (peripheral vascular disease) (HCC)       History of gout           -Complete examination performed -X-rays reviewed reviewed -Discussed continued care for stress fracture second metatarsal and likely also third metatarsal -Advised patient to continue with limited weightbearing with surgical shoe or stiff soled tennis shoe to prevent worsening of fracture and to allow more time for healing -Dispensed toe spacer to use for overlapping toes -Discussed continued care for chronic ankle pain -Patient requesting injection this visit made patient well aware that we have to limit excessive use of steroids however at this time we will attempt to  inject at the medial ankle since this is the area that is most painful today and this will be the second injection to this area. -After oral consent and aseptic prep, injected a mixture containing 1 ml of 2% plain lidocaine, 1 ml 0.5% plain marcaine, 0.5 ml of kenalog 10 and 0.5 ml of dexamethasone phosphate into right medial ankle at area of most pain -Encourage patient to continue with elevation for swelling and follow-up with her kidney doctor regarding the edema and management of gout -Continue with pain management follow-up -Continue with rest ice elevation and topical pain creams or rubs as needed -Patient to return to office as needed or sooner if problems or issues arise.   , DPM 

## 2020-12-23 DIAGNOSIS — J301 Allergic rhinitis due to pollen: Secondary | ICD-10-CM

## 2020-12-24 DIAGNOSIS — J3089 Other allergic rhinitis: Secondary | ICD-10-CM

## 2020-12-28 ENCOUNTER — Ambulatory Visit: Payer: Self-pay | Admitting: Allergy

## 2021-01-04 ENCOUNTER — Ambulatory Visit (INDEPENDENT_AMBULATORY_CARE_PROVIDER_SITE_OTHER): Payer: Medicare Other

## 2021-01-04 DIAGNOSIS — J309 Allergic rhinitis, unspecified: Secondary | ICD-10-CM | POA: Diagnosis not present

## 2021-01-12 ENCOUNTER — Ambulatory Visit (INDEPENDENT_AMBULATORY_CARE_PROVIDER_SITE_OTHER): Payer: Medicare Other | Admitting: *Deleted

## 2021-01-12 DIAGNOSIS — J309 Allergic rhinitis, unspecified: Secondary | ICD-10-CM

## 2021-01-17 DIAGNOSIS — M159 Polyosteoarthritis, unspecified: Secondary | ICD-10-CM | POA: Insufficient documentation

## 2021-01-17 DIAGNOSIS — G894 Chronic pain syndrome: Secondary | ICD-10-CM | POA: Insufficient documentation

## 2021-01-19 ENCOUNTER — Ambulatory Visit (INDEPENDENT_AMBULATORY_CARE_PROVIDER_SITE_OTHER): Payer: Medicare Other | Admitting: *Deleted

## 2021-01-19 DIAGNOSIS — J309 Allergic rhinitis, unspecified: Secondary | ICD-10-CM

## 2021-01-26 ENCOUNTER — Ambulatory Visit (INDEPENDENT_AMBULATORY_CARE_PROVIDER_SITE_OTHER): Payer: Medicare Other | Admitting: *Deleted

## 2021-01-26 DIAGNOSIS — J309 Allergic rhinitis, unspecified: Secondary | ICD-10-CM

## 2021-01-31 ENCOUNTER — Other Ambulatory Visit: Payer: Self-pay | Admitting: *Deleted

## 2021-01-31 ENCOUNTER — Ambulatory Visit (INDEPENDENT_AMBULATORY_CARE_PROVIDER_SITE_OTHER): Payer: Medicare Other | Admitting: *Deleted

## 2021-01-31 DIAGNOSIS — J309 Allergic rhinitis, unspecified: Secondary | ICD-10-CM | POA: Diagnosis not present

## 2021-01-31 MED ORDER — ALBUTEROL SULFATE HFA 108 (90 BASE) MCG/ACT IN AERS: INHALATION_SPRAY | RESPIRATORY_TRACT | 0 refills | Status: DC

## 2021-02-01 ENCOUNTER — Other Ambulatory Visit: Payer: Self-pay

## 2021-02-08 ENCOUNTER — Ambulatory Visit (INDEPENDENT_AMBULATORY_CARE_PROVIDER_SITE_OTHER): Payer: Medicare Other

## 2021-02-08 DIAGNOSIS — J309 Allergic rhinitis, unspecified: Secondary | ICD-10-CM | POA: Diagnosis not present

## 2021-02-15 ENCOUNTER — Ambulatory Visit: Payer: Medicare Other | Admitting: Allergy

## 2021-02-15 ENCOUNTER — Ambulatory Visit (INDEPENDENT_AMBULATORY_CARE_PROVIDER_SITE_OTHER): Payer: Medicare Other

## 2021-02-15 DIAGNOSIS — J309 Allergic rhinitis, unspecified: Secondary | ICD-10-CM

## 2021-02-15 LAB — PULMONARY FUNCTION TEST

## 2021-02-21 ENCOUNTER — Telehealth: Payer: Self-pay | Admitting: *Deleted

## 2021-02-21 ENCOUNTER — Ambulatory Visit (INDEPENDENT_AMBULATORY_CARE_PROVIDER_SITE_OTHER): Payer: Medicare Other | Admitting: *Deleted

## 2021-02-21 ENCOUNTER — Other Ambulatory Visit: Payer: Self-pay | Admitting: *Deleted

## 2021-02-21 DIAGNOSIS — J309 Allergic rhinitis, unspecified: Secondary | ICD-10-CM | POA: Diagnosis not present

## 2021-02-21 MED ORDER — TRELEGY ELLIPTA 200-62.5-25 MCG/ACT IN AEPB: 1.0000 | INHALATION_SPRAY | Freq: Every day | RESPIRATORY_TRACT | 1 refills | Status: DC

## 2021-02-21 NOTE — Telephone Encounter (Signed)
Diana Oneill is requesting a letter for social services to be able to provide financial assistance for her electric bill.  Per SS, the letter must state that she requires "controlled temperatures year around to avoid a health crisis". If the letter can be provided, please fax to [phone].

## 2021-02-22 ENCOUNTER — Encounter: Payer: Self-pay | Admitting: *Deleted

## 2021-02-22 NOTE — Telephone Encounter (Signed)
Patient called the office this morning and states she is needing this letter by today or they will cut off her electricity.

## 2021-02-22 NOTE — Telephone Encounter (Addendum)
Okay to write type letter?   To whom it concerns:  Diana Oneill is a patient at [facility] for treatment of asthma. She would benefit from a controlled indoor temperature year around to avoid a health crisis.

## 2021-02-22 NOTE — Telephone Encounter (Signed)
Pt informed that letter has been faxed.

## 2021-03-03 ENCOUNTER — Ambulatory Visit (INDEPENDENT_AMBULATORY_CARE_PROVIDER_SITE_OTHER): Payer: Medicare Other | Admitting: *Deleted

## 2021-03-03 DIAGNOSIS — J309 Allergic rhinitis, unspecified: Secondary | ICD-10-CM | POA: Diagnosis not present

## 2021-03-08 ENCOUNTER — Ambulatory Visit (INDEPENDENT_AMBULATORY_CARE_PROVIDER_SITE_OTHER): Payer: Medicare Other | Admitting: Allergy

## 2021-03-08 ENCOUNTER — Other Ambulatory Visit: Payer: Self-pay

## 2021-03-08 ENCOUNTER — Encounter: Payer: Self-pay | Admitting: Allergy

## 2021-03-08 VITALS — BP 114/62 | HR 60 | Resp 16 | Ht 64.8 in | Wt 210.2 lb

## 2021-03-08 DIAGNOSIS — J449 Chronic obstructive pulmonary disease, unspecified: Secondary | ICD-10-CM

## 2021-03-08 DIAGNOSIS — J309 Allergic rhinitis, unspecified: Secondary | ICD-10-CM

## 2021-03-08 DIAGNOSIS — K9049 Malabsorption due to intolerance, not elsewhere classified: Secondary | ICD-10-CM

## 2021-03-08 DIAGNOSIS — Z720 Tobacco use: Secondary | ICD-10-CM | POA: Diagnosis not present

## 2021-03-08 DIAGNOSIS — J3089 Other allergic rhinitis: Secondary | ICD-10-CM

## 2021-03-08 NOTE — Patient Instructions (Addendum)
Allergies   - continue avoidance measures for grass pollens, dust mite and cockroach.    - continue Ryvent 6mg  1 tab twice a day.  See if you can avoid using Benadryl at bedtime if the RyVent twice a day is more effective for you   - continue Singulair daily   - recommend performing nasal saline rinses to help clean/flush out the nose and sinus tract.  This also helps your nasal sprays work better if nose is cleaning.    - for nasal drainage use Astelin/Azelastine 2 sprays each nostril twice a day as needed   - use Flonase 2 sprays each nostril daily for the next 1 to 2 weeks for nasal congestion control.  Use for 1-2 weeks at a time if having nasal congestion or sinus pressure.     - continue allergen immunotherapy weekly injections at this time.  You have reached the Red (maintenance) vial.  Have access to epinephrine device on injections days  COPD with asthma  - she does find symptom improvement with Trelegy (vs previous inhaler like Breo)    - continue Trelegy 254mcg 1 puff daily.    - can also continue Flovent + Trelegy for additional inhaler steroid control as still does have some symptoms like wheezing despite Trelegy use   - after inhaler use Rinse/gargle mouth  - continue Singulair 10mg  daily at bedtime  - have access to albuterol inhaler 2 puffs every 4-6 hours as needed for cough/wheeze/shortness of breath/chest tightness.  May use 15-20 minutes prior to activity.   Monitor frequency of use.    Control goals:  Full participation in all desired activities (may need albuterol before activity) Albuterol use two time or less a week on average (not counting use with activity) Cough interfering with sleep two time or less a month Oral steroids no more than once a year No hospitalizations  Tobacco use  - continue your course to quitting!  Continue to decrease your daily cigarette use.    Food intolerance  - skin testing to milk has been negative thus confirming dairy intolerance  -  continue avoidance of milk/ice cream to prevent GI symptoms  Follow-up 6 months or sooner if needed

## 2021-03-08 NOTE — Progress Notes (Signed)
Follow-up Note  RE: Diana Oneill MRN: [id number] DOB: May 05, 1955 Date of Office Visit: 03/08/2021   History of present illness: Diana Oneill is a 65 y.o. female presenting today for follow-up of allergic rhinitis, asthma and COPD, food intolerance and tobacco use.  She was last seen in the office on 09/09/2020 by myself.  Since this visit she did see Dr. Duane Boston with pulmonology at [facility] in Oct referred by her PCP.  He recommended that she use Flovent as it was noted in his note that she reported Trelegy was not effective.  However today she feels this may he misunderstood.  She does feel that the Trelegy has been more beneficial than her previous inhalers in regards to her respiratory symptom control.  However she still does note some wheezing.   It was recommended at the visit with the pulmonologist that she stopped Trelegy and start Flovent.  She did start Flovent but she never stopped the Trelegy.  She has been doing both of these inhaler therapies and does feel like she is getting more benefit between the combination.  She also continues to take Singulair. Due to her smoking history the pulmonologist did order a chest CT that showed a small pulmonary nodule in the right lower lobe that is stable from previous study as well as thyroid nodules and some atherosclerosis. She does continue to smoke and is trying ot stay to 1/2 pack per day.   She is on allergen immunotherapy and tolerating injections well without any large local or systemic reactions.  She is in the red vial.  She can tell a difference in her allergy symptoms on injections.  She also states she can tell when she is getting due for injection as she does note more symptoms like congestion.  She does continue on RyVent as well as as needed use of azelastine nasal spray and Flonase as needed.  She does continue to avoid dairy products.  Review of systems in the past 4 weeks: Review of Systems  Constitutional:  Negative.   HENT: Negative.    Eyes: Negative.   Respiratory:  Positive for wheezing.   Cardiovascular: Negative.   Gastrointestinal: Negative.   Musculoskeletal: Negative.   Skin: Negative.   Neurological: Negative.    All other systems negative unless noted above in HPI  Past medical/social/surgical/family history have been reviewed and are unchanged unless specifically indicated below.  No changes  Medication List: Current Outpatient Medications  Medication Sig Dispense Refill   albuterol (VENTOLIN HFA) 108 (90 Base) MCG/ACT inhaler INHALE 2 PUFFS BY MOUTH EVERY 4 TO 6 HOURS AS NEEDED FOR COUGH, WHEEZING, SHORTNESS OF BREATH OR CHEST TIGHTNESS 1 each 0   Ascorbic Acid (VITAMIN C PO) Take 1,000 mg by mouth daily.      azelastine (ASTELIN) 0.1 % nasal spray Place 2 sprays into both nostrils 2 (two) times daily as needed for rhinitis or allergies.      B Complex Vitamins (VITAMIN B COMPLEX PO) Take 1 tablet by mouth daily.      Cholecalciferol (VITAMIN D3) 125 MCG (5000 UT) CAPS Take 5,000 Units by mouth daily.     COMBIVENT RESPIMAT 20-100 MCG/ACT AERS respimat Inhale 1 puff into the lungs every 6 (six) hours as needed for wheezing or shortness of breath.      doxycycline (MONODOX) 100 MG capsule Take one tablet by mouth twice daily for seven days. 14 capsule 0   EPINEPHrine 0.3 mg/0.3 mL IJ SOAJ injection Use as  directed for life threatening allergic reactions only 2 each 3   ferrous sulfate 325 (65 FE) MG EC tablet Take 325 mg by mouth 3 (three) times daily with meals.     fluticasone (FLONASE) 50 MCG/ACT nasal spray Place 2 sprays into both nostrils 2 (two) times daily as needed for allergies. 16 g 5   Fluticasone-Umeclidin-Vilant (TRELEGY ELLIPTA) 200-62.5-25 MCG/INH AEPB INHALE 1 PUFF BY MOUTH ONCE DAILY TO PREVENT COUGH OR WHEEZING. RINSE, GARGLE, AND SPIT AFTER USE 60 each 5   gabapentin (NEURONTIN) 300 MG capsule Take 600 mg by mouth 4 (four) times daily.      hydrochlorothiazide (HYDRODIURIL) 25 MG tablet TAKE 1/2 TABLET(12.5 MG) BY MOUTH DAILY     HYDROcodone-acetaminophen (NORCO/VICODIN) 5-325 MG tablet Take 1 tablet by mouth every 8 (eight) hours.     ketoconazole (NIZORAL) 2 % shampoo Apply 1 application topically 2 (two) times a week.      lisinopril (ZESTRIL) 10 MG tablet Take 1 tablet by mouth daily.     lisinopril-hydrochlorothiazide (ZESTORETIC) 10-12.5 MG tablet Take 1 tablet by mouth daily.     meloxicam (MOBIC) 7.5 MG tablet Take 1 tablet (7.5 mg total) by mouth daily. 30 tablet 0   methylPREDNISolone (MEDROL DOSEPAK) 4 MG TBPK tablet 6 day dose pack - take as directed 21 tablet 0   montelukast (SINGULAIR) 10 MG tablet Take 10 mg by mouth daily.     Multiple Vitamins-Minerals (ZINC PO) Take by mouth.     NARCAN 4 MG/0.1ML LIQD nasal spray kit USE 1 SPRAY IN NOSE AS DIRECTED     nystatin (MYCOSTATIN/NYSTOP) powder Apply 1 application topically daily.     RYVENT 6 MG TABS TAKE 1 TABLET BY MOUTH TWICE DAILY 60 tablet 5   tiZANidine (ZANAFLEX) 4 MG tablet Take 4 mg by mouth at bedtime.      TRELEGY ELLIPTA 200-62.5-25 MCG/ACT AEPB Inhale 1 puff into the lungs daily. 28 each 1   triamcinolone cream (KENALOG) 0.1 % Apply 1 application topically 2 (two) times daily as needed (itching).     Current Facility-Administered Medications  Medication Dose Route Frequency Provider Last Rate Last Admin   triamcinolone acetonide (KENALOG) 10 MG/ML injection 10 mg  10 mg Other Once Landis Martins, DPM         Known medication allergies: Allergies  Allergen Reactions   Penicillins Anaphylaxis and Other (See Comments)    Has patient had a PCN reaction causing immediate rash, facial/tongue/throat swelling, SOB or lightheadedness with hypotension: Yes Has patient had a PCN reaction causing severe rash involving mucus membranes or skin necrosis: No Has patient had a PCN reaction that required hospitalization: No Has patient had a PCN reaction occurring  within the last 10 years: No If all of the above answers are "NO", then may proceed with Cephalosporin use.   Prozac [Fluoxetine Hcl] Hives, Swelling and Other (See Comments)    Facial swelling     Physical examination: Blood pressure 114/62, pulse 60, resp. rate 16, height 5' 4.8" (1.646 m), weight 210 lb 3.2 oz (95.3 kg), SpO2 97 %.  General: Alert, interactive, in no acute distress. HEENT: PERRLA, TMs pearly gray, turbinates non-edematous without discharge, post-pharynx non erythematous. Neck: Supple without lymphadenopathy. Lungs: Clear to auscultation without wheezing, rhonchi or rales. {no increased work of breathing. CV: Normal S1, S2 without murmurs. Abdomen: Nondistended, nontender. Skin: Warm and dry, without lesions or rashes. Extremities:  No clubbing, cyanosis or edema. Neuro:   Grossly intact.  Diagnositics/Labs: Labs/Imaging: I  do not have access to review the imaging at the time of this visit. CT chest without contrast 03/04/2021 IMPRESSION: 1. Unchanged 3 mm pulmonary nodule the RIGHT lower lobe for greater than 2 years, consistent with a benign etiology. Recommend evaluation for candidacy for lung cancer screening CT as clinically warranted. 2. Multiple bilateral healing rib fractures and a healing displaced sternal fracture. Recommend correlation with traumatic history. 3. 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 Recommend annual imaging followup by CTA or MRA.  4. Multiple bilateral thyroid nodules measuring up to 15 mm. Recommend thyroid US if not previously performed.    Assessment and plan:   Allergic rhinitis   - continue avoidance measures for grass pollens, dust mite and cockroach.    - continue Ryvent 68m 1 tab twice a day.  See if you can avoid using Benadryl at bedtime if the RyVent twice a day is more effective for you   - continue Singulair daily   - recommend performing nasal saline rinses to help  clean/flush out the nose and sinus tract.  This also helps your nasal sprays work better if nose is cleaning.    - for nasal drainage use Astelin/Azelastine 2 sprays each nostril twice a day as needed   - use Flonase 2 sprays each nostril daily for the next 1 to 2 weeks for nasal congestion control.  Use for 1-2 weeks at a time if having nasal congestion or sinus pressure.     - continue allergen immunotherapy weekly injections at this time.  You have reached the Red (maintenance) vial.  Have access to epinephrine device on injections days  COPD with asthma  - she does find symptom improvement with Trelegy (vs previous inhaler like Breo)    - continue Trelegy 2049m 1 puff daily.    - can also continue Flovent + Trelegy for additional inhaler steroid control as still does have some symptoms like wheezing despite Trelegy use   - after inhaler use Rinse/gargle mouth  - continue Singulair 1056maily at bedtime  - have access to albuterol inhaler 2 puffs every 4-6 hours as needed for cough/wheeze/shortness of breath/chest tightness.  May use 15-20 minutes prior to activity.   Monitor frequency of use.    Control goals:  Full participation in all desired activities (may need albuterol before activity) Albuterol use two time or less a week on average (not counting use with activity) Cough interfering with sleep two time or less a month Oral steroids no more than once a year No hospitalizations  Tobacco use  - continue your course to quitting!  Continue to decrease your daily cigarette use.    Food intolerance  - skin testing to milk has been negative thus confirming dairy intolerance  - continue avoidance of milk/ice cream to prevent GI symptoms  Follow-up 6 months or sooner if needed  I appreciate the opportunity to take part in [place]. Please do not hesitate to contact me with questions.  Sincerely,   ShaPrudy FeelerD [facility]  [place]

## 2021-03-14 ENCOUNTER — Ambulatory Visit (INDEPENDENT_AMBULATORY_CARE_PROVIDER_SITE_OTHER): Payer: Medicare Other | Admitting: *Deleted

## 2021-03-14 DIAGNOSIS — J309 Allergic rhinitis, unspecified: Secondary | ICD-10-CM

## 2021-03-21 ENCOUNTER — Ambulatory Visit (INDEPENDENT_AMBULATORY_CARE_PROVIDER_SITE_OTHER): Payer: Medicare Other

## 2021-03-21 DIAGNOSIS — J309 Allergic rhinitis, unspecified: Secondary | ICD-10-CM | POA: Diagnosis not present

## 2021-03-28 ENCOUNTER — Ambulatory Visit (INDEPENDENT_AMBULATORY_CARE_PROVIDER_SITE_OTHER): Payer: Medicare Other | Admitting: *Deleted

## 2021-03-28 DIAGNOSIS — J309 Allergic rhinitis, unspecified: Secondary | ICD-10-CM

## 2021-03-29 DIAGNOSIS — J301 Allergic rhinitis due to pollen: Secondary | ICD-10-CM | POA: Diagnosis not present

## 2021-03-29 NOTE — Progress Notes (Signed)
VIALS MADE. EXP 03-29-22

## 2021-03-30 DIAGNOSIS — J3089 Other allergic rhinitis: Secondary | ICD-10-CM | POA: Diagnosis not present

## 2021-03-31 ENCOUNTER — Other Ambulatory Visit: Payer: Self-pay | Admitting: Orthopedic Surgery

## 2021-03-31 DIAGNOSIS — M25551 Pain in right hip: Secondary | ICD-10-CM

## 2021-04-05 ENCOUNTER — Ambulatory Visit (INDEPENDENT_AMBULATORY_CARE_PROVIDER_SITE_OTHER): Payer: Medicare Other | Admitting: *Deleted

## 2021-04-05 DIAGNOSIS — J309 Allergic rhinitis, unspecified: Secondary | ICD-10-CM

## 2021-04-12 ENCOUNTER — Ambulatory Visit (INDEPENDENT_AMBULATORY_CARE_PROVIDER_SITE_OTHER): Payer: Medicare Other

## 2021-04-12 ENCOUNTER — Ambulatory Visit (INDEPENDENT_AMBULATORY_CARE_PROVIDER_SITE_OTHER): Payer: Medicare Other | Admitting: Sports Medicine

## 2021-04-12 DIAGNOSIS — G8929 Other chronic pain: Secondary | ICD-10-CM

## 2021-04-12 DIAGNOSIS — S99911A Unspecified injury of right ankle, initial encounter: Secondary | ICD-10-CM

## 2021-04-12 DIAGNOSIS — J309 Allergic rhinitis, unspecified: Secondary | ICD-10-CM

## 2021-04-12 DIAGNOSIS — S99921A Unspecified injury of right foot, initial encounter: Secondary | ICD-10-CM

## 2021-04-12 DIAGNOSIS — M25571 Pain in right ankle and joints of right foot: Secondary | ICD-10-CM | POA: Diagnosis not present

## 2021-04-12 DIAGNOSIS — S93501A Unspecified sprain of right great toe, initial encounter: Secondary | ICD-10-CM

## 2021-04-12 DIAGNOSIS — M1250 Traumatic arthropathy, unspecified site: Secondary | ICD-10-CM

## 2021-04-12 DIAGNOSIS — I739 Peripheral vascular disease, unspecified: Secondary | ICD-10-CM

## 2021-04-12 MED ORDER — TRIAMCINOLONE ACETONIDE 10 MG/ML IJ SUSP
10.0000 mg | Freq: Once | INTRAMUSCULAR | Status: AC
Start: 2021-04-12 — End: 2021-04-12
  Administered 2021-04-12: 10 mg

## 2021-04-12 NOTE — Progress Notes (Signed)
Subjective: Diana Oneill is a 65 y.o. female patient who returns to office for follow up evaluation of right foot and ankle pain.  Patient reports that 2 weeks ago she tripped and fell and then also fell again and hurt her foot and ankle as well as fell down on her knee.  Patient reports pain that is staying about the same and increased swelling.  Denies any other pedal complaints at this time.  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   Sinus bradycardia on ECG 10/20/2019   Morbid (severe) obesity with alveolar hypoventilation ([place]) 09/12/2019   Moderate persistent asthma without complication 93/73/4287   Seasonal and perennial allergic rhinitis 06/23/2019   Stage 3 chronic kidney disease ([place]) 06/23/2019   Tobacco use 06/23/2019   Coronavirus infection 04/21/2019   Preoperative cardiovascular examination 04/18/2019   Overweight 04/18/2019   Cigarette smoker 04/18/2019   Chronic knee pain after total replacement of left knee joint 03/05/2019   Ankle pain 07/10/2017   Closed displaced trimalleolar fracture of right ankle 01/05/2017   Acute encephalopathy 10/25/2016   Essential hypertension 10/25/2016   Obesity 10/25/2016   Acidosis 10/25/2016   Nausea without vomiting 10/25/2016   Tachycardia 10/25/2016   COPD (chronic obstructive pulmonary disease) ([place]) 10/25/2016   Acute respiratory failure ([place]) 10/25/2016   Hyponatremia    Acute kidney injury ([place])    Anemia    Thrombocytopenia ([place])    Osteoarthritis of left knee 10/23/2016   Bipolar disorder, now depressed ([place]) 08/10/2014   Cocaine use disorder, mild, in early remission ([place]) 08/10/2014    Current Outpatient Medications on File Prior to Visit  Medication Sig Dispense Refill   albuterol (VENTOLIN HFA) 108 (90 Base) MCG/ACT inhaler INHALE 2 PUFFS BY MOUTH EVERY 4 TO 6 HOURS AS NEEDED  FOR COUGH, WHEEZING, SHORTNESS OF BREATH OR CHEST TIGHTNESS 1 each 0   Ascorbic Acid (VITAMIN C PO) Take 1,000 mg by mouth daily.      azelastine (ASTELIN) 0.1 % nasal spray Place 2 sprays into both nostrils 2 (two) times daily as needed for rhinitis or allergies.      B Complex Vitamins (VITAMIN B COMPLEX PO) Take 1 tablet by mouth daily.      Cholecalciferol (VITAMIN D3) 125 MCG (5000 UT) CAPS Take 5,000 Units by mouth daily.     COMBIVENT RESPIMAT 20-100 MCG/ACT AERS respimat Inhale 1 puff into the lungs every 6 (six) hours as needed for wheezing or shortness of breath.      doxycycline (MONODOX) 100 MG capsule Take one tablet by mouth twice daily for seven days. 14 capsule 0   EPINEPHrine 0.3 mg/0.3 mL IJ SOAJ injection Use as directed for life threatening allergic reactions only 2 each 3   ferrous sulfate 325 (65 FE) MG EC tablet Take 325 mg by mouth 3 (three) times daily with meals.     fluticasone (FLONASE) 50 MCG/ACT nasal spray Place 2 sprays into both nostrils 2 (two) times daily as needed for allergies. 16 g 5   Fluticasone-Umeclidin-Vilant (TRELEGY ELLIPTA) 200-62.5-25 MCG/INH AEPB INHALE 1 PUFF BY MOUTH ONCE DAILY TO PREVENT COUGH OR WHEEZING. RINSE, GARGLE, AND SPIT AFTER USE 60 each 5   gabapentin (NEURONTIN) 300 MG capsule Take 600 mg by mouth 4 (four) times daily.     hydrochlorothiazide (HYDRODIURIL) 25 MG tablet TAKE 1/2 TABLET(12.5 MG) BY MOUTH  DAILY     HYDROcodone-acetaminophen (NORCO/VICODIN) 5-325 MG tablet Take 1 tablet by mouth every 8 (eight) hours.     ketoconazole (NIZORAL) 2 % shampoo Apply 1 application topically 2 (two) times a week.      lisinopril (ZESTRIL) 10 MG tablet Take 1 tablet by mouth daily.     lisinopril-hydrochlorothiazide (ZESTORETIC) 10-12.5 MG tablet Take 1 tablet by mouth daily.     meloxicam (MOBIC) 7.5 MG tablet Take 1 tablet (7.5 mg total) by mouth daily. 30 tablet 0   methylPREDNISolone (MEDROL DOSEPAK) 4 MG TBPK tablet 6 day dose pack - take as  directed 21 tablet 0   montelukast (SINGULAIR) 10 MG tablet Take 10 mg by mouth daily.     Multiple Vitamins-Minerals (ZINC PO) Take by mouth.     NARCAN 4 MG/0.1ML LIQD nasal spray kit USE 1 SPRAY IN NOSE AS DIRECTED     nystatin (MYCOSTATIN/NYSTOP) powder Apply 1 application topically daily.     RYVENT 6 MG TABS TAKE 1 TABLET BY MOUTH TWICE DAILY 60 tablet 5   tiZANidine (ZANAFLEX) 4 MG tablet Take 4 mg by mouth at bedtime.      TRELEGY ELLIPTA 200-62.5-25 MCG/ACT AEPB Inhale 1 puff into the lungs daily. 28 each 1   triamcinolone cream (KENALOG) 0.1 % Apply 1 application topically 2 (two) times daily as needed (itching).     Current Facility-Administered Medications on File Prior to Visit  Medication Dose Route Frequency Provider Last Rate Last Admin   triamcinolone acetonide (KENALOG) 10 MG/ML injection 10 mg  10 mg Other Once Landis Martins, DPM        Allergies  Allergen Reactions   Penicillins Anaphylaxis and Other (See Comments)    Has patient had a PCN reaction causing immediate rash, facial/tongue/throat swelling, SOB or lightheadedness with hypotension: Yes Has patient had a PCN reaction causing severe rash involving mucus membranes or skin necrosis: No Has patient had a PCN reaction that required hospitalization: No Has patient had a PCN reaction occurring within the last 10 years: No If all of the above answers are "NO", then may proceed with Cephalosporin use.   Prozac [Fluoxetine Hcl] Hives, Swelling and Other (See Comments)    Facial swelling    Objective:  General: Alert and oriented x3 in no acute distress  Dermatology: Previous surgical scars well-healed.  No open lesions bilateral lower extremities, no webspace macerations, no ecchymosis bilateral, all nails x 8 are well manicured short and thick and currently asymptomatic.  Vascular: Dorsalis Pedis and Posterior Tibial pedal pulses faintly palpable due to swelling, Capillary Fill Time 3 seconds,(-) pedal hair  growth bilateral, +1 pitting edema bilateral lower extremities, venous hyperpigmentation and varicosities especially on the right with most swelling noted at the right ankle unchanged from prior consistent with PVD, temperature gradient within normal limits.  Neurology: Johney Maine sensation intact via light touch bilateral.  Musculoskeletal: Tenderness palpation at worst on today's visit worse at the right great toe joint, as well as medial greater than lateral ankle.  Severely limited range of motion on right history of severe arthritis unchanged from prior.  Patient also has limited range of motion at the right first and second MPJs with digital contracture supportive of hammertoe deformity.  X-ray right foot previous foot fractures have healed, no new fracture at the first metatarsal or MPJ at area of pain, there is unchanged arthritis noted on the ankle x-ray ankle hardware intact  Problem List Items Addressed This Visit  Other   Ankle pain   Other Visit Diagnoses     Sprain of right great toe, initial encounter    -  Primary   DOI 03-31-21   Injury of right ankle and foot, initial encounter       Relevant Orders   DG Ankle Complete Right   DG Foot 2 Views Right   Traumatic arthritis       PVD (peripheral vascular disease) ([place])           -Complete examination performed -X-rays reviewed  -Discussed care for likely first metatarsophalangeal joint sprain after a fall -Advised patient to splint the first toe with Coban as I have directed this visit -Discussed continued care for chronic ankle pain -After oral consent and aseptic prep, injected a mixture containing 1 ml of 2% plain lidocaine, 1 ml 0.5% plain marcaine, 0.5 ml of kenalog 10 and 0.5 ml of dexamethasone phosphate into right anterior ankle to see if this will help the entire joint -Encourage patient to continue with elevation for swelling  -Advised patient that she may need to follow-up with her orthopedic surgeon about  her ankle joint since she previously saw Dr. Doran Durand and discussed replacement versus fusion -Continue with rest ice elevation and topical pain creams or rubs as needed -Patient to return to office as scheduled or sooner if problems or issues arise.    Landis Martins, DPM

## 2021-04-14 ENCOUNTER — Other Ambulatory Visit: Payer: Self-pay | Admitting: Allergy

## 2021-04-19 ENCOUNTER — Ambulatory Visit (INDEPENDENT_AMBULATORY_CARE_PROVIDER_SITE_OTHER): Payer: Medicare Other

## 2021-04-19 DIAGNOSIS — J309 Allergic rhinitis, unspecified: Secondary | ICD-10-CM | POA: Diagnosis not present

## 2021-04-26 ENCOUNTER — Ambulatory Visit (INDEPENDENT_AMBULATORY_CARE_PROVIDER_SITE_OTHER): Payer: Medicare Other | Admitting: *Deleted

## 2021-04-26 DIAGNOSIS — J309 Allergic rhinitis, unspecified: Secondary | ICD-10-CM | POA: Diagnosis not present

## 2021-05-03 ENCOUNTER — Ambulatory Visit (INDEPENDENT_AMBULATORY_CARE_PROVIDER_SITE_OTHER): Payer: Medicare Other | Admitting: *Deleted

## 2021-05-03 DIAGNOSIS — J309 Allergic rhinitis, unspecified: Secondary | ICD-10-CM

## 2021-05-16 ENCOUNTER — Ambulatory Visit (INDEPENDENT_AMBULATORY_CARE_PROVIDER_SITE_OTHER): Payer: Medicare Other | Admitting: *Deleted

## 2021-05-16 DIAGNOSIS — J309 Allergic rhinitis, unspecified: Secondary | ICD-10-CM | POA: Diagnosis not present

## 2021-05-23 ENCOUNTER — Other Ambulatory Visit: Payer: Self-pay | Admitting: Allergy

## 2021-05-30 ENCOUNTER — Ambulatory Visit (INDEPENDENT_AMBULATORY_CARE_PROVIDER_SITE_OTHER): Payer: Medicare Other | Admitting: *Deleted

## 2021-05-30 DIAGNOSIS — J309 Allergic rhinitis, unspecified: Secondary | ICD-10-CM | POA: Diagnosis not present

## 2021-06-08 DIAGNOSIS — J301 Allergic rhinitis due to pollen: Secondary | ICD-10-CM | POA: Diagnosis not present

## 2021-06-08 NOTE — Progress Notes (Signed)
VIALS EXP 06-08-22

## 2021-06-09 DIAGNOSIS — J3089 Other allergic rhinitis: Secondary | ICD-10-CM | POA: Diagnosis not present

## 2021-06-13 ENCOUNTER — Ambulatory Visit (INDEPENDENT_AMBULATORY_CARE_PROVIDER_SITE_OTHER): Payer: Medicare Other | Admitting: *Deleted

## 2021-06-13 DIAGNOSIS — J309 Allergic rhinitis, unspecified: Secondary | ICD-10-CM | POA: Diagnosis not present

## 2021-06-24 ENCOUNTER — Ambulatory Visit (INDEPENDENT_AMBULATORY_CARE_PROVIDER_SITE_OTHER): Payer: Medicare Other | Admitting: Sports Medicine

## 2021-06-24 ENCOUNTER — Encounter: Payer: Self-pay | Admitting: Sports Medicine

## 2021-06-24 DIAGNOSIS — I739 Peripheral vascular disease, unspecified: Secondary | ICD-10-CM

## 2021-06-24 DIAGNOSIS — G8929 Other chronic pain: Secondary | ICD-10-CM

## 2021-06-24 DIAGNOSIS — M1250 Traumatic arthropathy, unspecified site: Secondary | ICD-10-CM

## 2021-06-24 DIAGNOSIS — M25571 Pain in right ankle and joints of right foot: Secondary | ICD-10-CM

## 2021-06-24 DIAGNOSIS — M7751 Other enthesopathy of right foot: Secondary | ICD-10-CM

## 2021-06-24 MED ORDER — TRIAMCINOLONE ACETONIDE 10 MG/ML IJ SUSP: 10.0000 mg | Freq: Once | INTRAMUSCULAR | Status: AC

## 2021-06-24 NOTE — Progress Notes (Signed)
Subjective: Diana Oneill is a 66 y.o. female patient who returns to office for follow up evaluation of right foot and ankle pain.  Patient reports that her ankle is still doing pretty good states that the pain is slowly coming back at her big toe joint states that underneath her big toe joint it hurts and sometimes the pain radiates around the joint blood to the top of the foot.  Patient does admit that previous taping did help.  Patient denies any other pedal complaints at this time.  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) 09/12/2019   Moderate persistent asthma without complication 54/65/6812   Seasonal and perennial allergic rhinitis 06/23/2019   Stage 3 chronic kidney disease ([place]) 06/23/2019   Tobacco use 06/23/2019   Coronavirus infection 04/21/2019   Preoperative cardiovascular examination 04/18/2019   Overweight 04/18/2019   Cigarette smoker 04/18/2019   Chronic knee pain after total replacement of left knee joint 03/05/2019   Ankle pain 07/10/2017   Closed displaced trimalleolar fracture of right ankle 01/05/2017   Acute encephalopathy 10/25/2016   Essential hypertension 10/25/2016   Obesity 10/25/2016   Acidosis 10/25/2016   Nausea without vomiting 10/25/2016   Tachycardia 10/25/2016   COPD (chronic obstructive pulmonary disease) ([place]) 10/25/2016   Acute respiratory failure ([place]) 10/25/2016   Hyponatremia    Acute kidney injury ([place])    Anemia    Thrombocytopenia ([place])    Osteoarthritis of left knee 10/23/2016   Bipolar disorder, now depressed ([place]) 08/10/2014   Cocaine use disorder, mild, in early remission ([place]) 08/10/2014   Cocaine  use 08/10/2014    Current Outpatient Medications on File Prior to Visit  Medication Sig Dispense Refill   busPIRone (BUSPAR) 5 MG tablet buspirone 5 mg tablet     diclofenac Sodium (VOLTAREN) 1 % GEL diclofenac 1 % topical gel  APPLY STRIP TOPICALLY TO THE AFFECTED AREA TWICE DAILY AS NEEDED FOR PAIN     fluticasone (FLOVENT HFA) 110 MCG/ACT inhaler Flovent HFA 110 mcg/actuation aerosol inhaler  INHALE 1 PUFF INTO THE LUNGS TWICE DAILY     furosemide (LASIX) 40 MG tablet furosemide 40 mg tablet  TAKE 1 TABLET BY MOUTH EVERY MORNING AND ADDITIONAL 1 TABLET IN THE AFTERNOON AS NEEDED FOR SWELLING     gabapentin (NEURONTIN) 300 MG capsule Take by mouth.     guaiFENesin (MUCINEX) 600 MG 12 hr tablet Take by mouth.     Lidocaine-Menthol 4-1 % PTCH Apply topically.     montelukast (SINGULAIR) 10 MG tablet Take 1 tablet by mouth daily.     acetaminophen (TYLENOL) 325 MG tablet Take by mouth.     albuterol (VENTOLIN HFA) 108 (90 Base) MCG/ACT inhaler INHALE 2 PUFFS BY MOUTH EVERY 4 TO 6 HOURS AS NEEDED FOR COUGH OR WHEEZING OR SHORTNESS OF BREATH OR CHEST TIGHTNESS 8 g 2   allopurinol (ZYLOPRIM) 100 MG tablet Take 100 mg by mouth daily.     Ascorbic Acid (VITAMIN C PO) Take 1,000 mg by mouth daily.      aspirin 81 MG EC tablet aspirin 81 mg tablet,delayed release  TAKE 1 TABLET BY MOUTH TWICE DAILY     azelastine (ASTELIN) 0.1 %  nasal spray Place 2 sprays into both nostrils 2 (two) times daily as needed for rhinitis or allergies.      B Complex Vitamins (VITAMIN B COMPLEX PO) Take 1 tablet by mouth daily.      cetirizine (ZYRTEC) 10 MG tablet cetirizine 10 mg tablet  TAKE 1 TABLET BY MOUTH EVERY DAY     Cholecalciferol (VITAMIN D3) 125 MCG (5000 UT) CAPS Take 5,000 Units by mouth daily.     colchicine 0.6 MG tablet Take 0.6 mg by mouth 2 (two) times daily as needed.     COMBIVENT RESPIMAT 20-100 MCG/ACT AERS respimat Inhale 1 puff into the lungs every 6 (six) hours as needed for wheezing or  shortness of breath.      cyanocobalamin 100 MCG tablet Take by mouth.     EPINEPHrine 0.3 mg/0.3 mL IJ SOAJ injection Use as directed for life threatening allergic reactions only 2 each 3   escitalopram (LEXAPRO) 20 MG tablet Take by mouth.     ferrous sulfate 325 (65 FE) MG EC tablet Take 325 mg by mouth 3 (three) times daily with meals.     fluconazole (DIFLUCAN) 150 MG tablet fluconazole 150 mg tablet  TAKE 1 TABLET BY MOUTH NOW FOR YEAST INFECTION. REPEAT IN 1 WEEK     fluticasone (FLONASE) 50 MCG/ACT nasal spray Place 2 sprays into both nostrils 2 (two) times daily as needed for allergies. 16 g 5   Fluticasone-Umeclidin-Vilant (TRELEGY ELLIPTA) 200-62.5-25 MCG/INH AEPB INHALE 1 PUFF BY MOUTH ONCE DAILY TO PREVENT COUGH OR WHEEZING. RINSE, GARGLE, AND SPIT AFTER USE 60 each 5   hydrochlorothiazide (HYDRODIURIL) 25 MG tablet TAKE 1/2 TABLET(12.5 MG) BY MOUTH DAILY     HYDROcodone-acetaminophen (NORCO/VICODIN) 5-325 MG tablet Take 1 tablet by mouth every 8 (eight) hours.     ibuprofen (ADVIL) 800 MG tablet ibuprofen 800 mg tablet  TAKE 1 TABLET BY MOUTH THREE TIMES DAILY AS NEEDED FOR PAIN     ketoconazole (NIZORAL) 2 % shampoo Apply 1 application topically 2 (two) times a week.      lidocaine (LIDODERM) 5 % lidocaine 5 % topical patch     lisinopril (ZESTRIL) 10 MG tablet Take 1 tablet by mouth daily.     lisinopril-hydrochlorothiazide (ZESTORETIC) 10-12.5 MG tablet Take 1 tablet by mouth daily.     loratadine (CLARITIN) 10 MG tablet Take 1 tablet by mouth as needed.     meloxicam (MOBIC) 7.5 MG tablet Take 1 tablet (7.5 mg total) by mouth daily. 30 tablet 0   montelukast (SINGULAIR) 10 MG tablet Take 10 mg by mouth daily.     Multiple Vitamins-Minerals (ZINC PO) Take by mouth.     NARCAN 4 MG/0.1ML LIQD nasal spray kit USE 1 SPRAY IN NOSE AS DIRECTED     nystatin (MYCOSTATIN/NYSTOP) powder Apply 1 application topically daily.     omeprazole (PRILOSEC) 40 MG capsule Take 40 mg by mouth  daily.     oxyCODONE (OXY IR/ROXICODONE) 5 MG immediate release tablet oxycodone 5 mg tablet  TAKE 1 TABLET BY MOUTH EVERY 6 HOURS AS NEEDED FOR PAIN     promethazine (PHENERGAN) 25 MG tablet promethazine 25 mg tablet  TAKE 1 TABLET BY MOUTH EVERY 8 HOURS AS NEEDED FOR NAUSEA OR VOMITING     RYVENT 6 MG TABS TAKE 1 TABLET BY MOUTH TWICE DAILY 60 tablet 5   tiZANidine (ZANAFLEX) 4 MG tablet Take 4 mg by mouth at bedtime.      TRELEGY ELLIPTA 200-62.5-25 MCG/ACT  AEPB Inhale 1 puff into the lungs daily. 28 each 1   triamcinolone cream (KENALOG) 0.5 % Apply topically 2 (two) times daily.     Current Facility-Administered Medications on File Prior to Visit  Medication Dose Route Frequency Provider Last Rate Last Admin   triamcinolone acetonide (KENALOG) 10 MG/ML injection 10 mg  10 mg Other Once Landis Martins, DPM        Allergies  Allergen Reactions   Penicillins Anaphylaxis and Other (See Comments)    Has patient had a PCN reaction causing immediate rash, facial/tongue/throat swelling, SOB or lightheadedness with hypotension: Yes Has patient had a PCN reaction causing severe rash involving mucus membranes or skin necrosis: No Has patient had a PCN reaction that required hospitalization: No Has patient had a PCN reaction occurring within the last 10 years: No If all of the above answers are "NO", then may proceed with Cephalosporin use.   Prozac [Fluoxetine Hcl] Hives, Swelling and Other (See Comments)    Facial swelling    Objective:  General: Alert and oriented x3 in no acute distress  Dermatology: Previous surgical scars well-healed.  No open lesions bilateral lower extremities, no webspace macerations, no ecchymosis bilateral, all nails x 8 are well manicured with polish short and thick and currently asymptomatic, like before.  Vascular: Dorsalis Pedis and Posterior Tibial pedal pulses faintly palpable due to swelling, Capillary Fill Time 3 seconds,(-) pedal hair growth bilateral,  +1 pitting edema bilateral lower extremities, venous hyperpigmentation and varicosities especially on the right with most swelling noted at the right ankle as previously noted.   Neurology: Johney Maine sensation intact via light touch bilateral.  Musculoskeletal: Tenderness palpation at worst on today's visit worse at the right great toe joint plantar aspect of the 1st MTPJ. No pain at right ankle, Unchanged severely limited range of motion on right history of severe arthritis unchanged from prior.  Patient also has limited range of motion at the right first and second MPJs with digital contracture supportive of hammertoe deformity as previously noted.   Problem List Items Addressed This Visit       Other   Ankle pain   Other Visit Diagnoses     Capsulitis of metatarsophalangeal (MTP) joint of right foot    -  Primary   Relevant Medications   acetaminophen (TYLENOL) 325 MG tablet   allopurinol (ZYLOPRIM) 100 MG tablet   aspirin 81 MG EC tablet   colchicine 0.6 MG tablet   ibuprofen (ADVIL) 800 MG tablet   oxyCODONE (OXY IR/ROXICODONE) 5 MG immediate release tablet   Traumatic arthritis       PVD (peripheral vascular disease) (HCC)       Relevant Medications   aspirin 81 MG EC tablet   furosemide (LASIX) 40 MG tablet       -Complete examination performed -Discussed continued care for right first metatarsal phalangeal joint pain likely residual capsulitis after previous injury now the sprain part has resolved but there is still residual pain underneath the big toe joint -After oral consent and aseptic prep, injected a mixture containing 1 ml of 2% plain lidocaine, 1 ml 0.5% plain marcaine, 0.5 ml of kenalog 10 and 0.5 ml of dexamethasone phosphate into right plantar first metatarsophalangeal joint at the sesamoid complex to see if this will help the entire joint and dispensed protective tube foam for patient to use as directed to further offload the joint -Encouraged patient to closely  monitor ankle symptoms and if reoccurs come in as needed for  injection to help manage her traumatic arthritis at her right ankle -Encouraged patient to continue with elevation for swelling  -Continue with rest ice elevation and topical pain creams or rubs as needed like before -Patient to return to office as scheduled or sooner if problems or issues arise.    Landis Martins, DPM

## 2021-06-26 ENCOUNTER — Other Ambulatory Visit: Payer: Self-pay | Admitting: Podiatry

## 2021-06-29 ENCOUNTER — Ambulatory Visit (INDEPENDENT_AMBULATORY_CARE_PROVIDER_SITE_OTHER): Payer: Medicare Other | Admitting: *Deleted

## 2021-06-29 DIAGNOSIS — J309 Allergic rhinitis, unspecified: Secondary | ICD-10-CM | POA: Diagnosis not present

## 2021-07-12 ENCOUNTER — Other Ambulatory Visit: Payer: Self-pay | Admitting: Allergy

## 2021-07-12 ENCOUNTER — Ambulatory Visit (INDEPENDENT_AMBULATORY_CARE_PROVIDER_SITE_OTHER): Payer: Medicare Other | Admitting: *Deleted

## 2021-07-12 DIAGNOSIS — J309 Allergic rhinitis, unspecified: Secondary | ICD-10-CM

## 2021-07-26 ENCOUNTER — Ambulatory Visit (INDEPENDENT_AMBULATORY_CARE_PROVIDER_SITE_OTHER): Payer: Medicare Other | Admitting: *Deleted

## 2021-07-26 DIAGNOSIS — J309 Allergic rhinitis, unspecified: Secondary | ICD-10-CM | POA: Diagnosis not present

## 2021-08-02 ENCOUNTER — Ambulatory Visit (INDEPENDENT_AMBULATORY_CARE_PROVIDER_SITE_OTHER): Payer: Medicare Other | Admitting: *Deleted

## 2021-08-02 DIAGNOSIS — J309 Allergic rhinitis, unspecified: Secondary | ICD-10-CM

## 2021-08-08 ENCOUNTER — Ambulatory Visit (INDEPENDENT_AMBULATORY_CARE_PROVIDER_SITE_OTHER): Payer: Medicare Other | Admitting: *Deleted

## 2021-08-08 DIAGNOSIS — J309 Allergic rhinitis, unspecified: Secondary | ICD-10-CM | POA: Diagnosis not present

## 2021-08-13 ENCOUNTER — Other Ambulatory Visit: Payer: Self-pay | Admitting: Podiatry

## 2021-08-22 ENCOUNTER — Ambulatory Visit (INDEPENDENT_AMBULATORY_CARE_PROVIDER_SITE_OTHER): Payer: Medicare Other | Admitting: *Deleted

## 2021-08-22 DIAGNOSIS — J309 Allergic rhinitis, unspecified: Secondary | ICD-10-CM

## 2021-08-29 ENCOUNTER — Ambulatory Visit (INDEPENDENT_AMBULATORY_CARE_PROVIDER_SITE_OTHER): Payer: Medicare Other | Admitting: *Deleted

## 2021-08-29 DIAGNOSIS — J309 Allergic rhinitis, unspecified: Secondary | ICD-10-CM | POA: Diagnosis not present

## 2021-09-06 ENCOUNTER — Ambulatory Visit (INDEPENDENT_AMBULATORY_CARE_PROVIDER_SITE_OTHER): Payer: Medicare Other | Admitting: Allergy

## 2021-09-06 ENCOUNTER — Encounter: Payer: Self-pay | Admitting: Allergy

## 2021-09-06 VITALS — BP 112/62 | HR 92 | Resp 16

## 2021-09-06 DIAGNOSIS — K9049 Malabsorption due to intolerance, not elsewhere classified: Secondary | ICD-10-CM | POA: Diagnosis not present

## 2021-09-06 DIAGNOSIS — J3089 Other allergic rhinitis: Secondary | ICD-10-CM

## 2021-09-06 DIAGNOSIS — Z720 Tobacco use: Secondary | ICD-10-CM | POA: Diagnosis not present

## 2021-09-06 DIAGNOSIS — J449 Chronic obstructive pulmonary disease, unspecified: Secondary | ICD-10-CM

## 2021-09-06 MED ORDER — FLUTICASONE PROPIONATE HFA 110 MCG/ACT IN AERO: INHALATION_SPRAY | RESPIRATORY_TRACT | 5 refills | Status: DC

## 2021-09-06 MED ORDER — RYCLORA 2 MG/5ML PO SOLN: ORAL | 5 refills | Status: DC

## 2021-09-06 MED ORDER — AZELASTINE HCL 0.05 % OP SOLN: 1.0000 [drp] | Freq: Two times a day (BID) | OPHTHALMIC | 5 refills | Status: DC

## 2021-09-06 MED ORDER — TRELEGY ELLIPTA 200-62.5-25 MCG/ACT IN AEPB: 1.0000 | INHALATION_SPRAY | Freq: Every day | RESPIRATORY_TRACT | 1 refills | Status: DC

## 2021-09-06 NOTE — Progress Notes (Signed)
? ? ?Follow-up Note ? ?RE: Diana Oneill MRN: [id number] DOB: [birth year] ?Date of Office Visit: 09/06/2021 ? ? ?History of present illness: ?Diana Oneill is a 66 y.o. female presenting today for follow-up of allergic rhinitis, COPD w asthma overlap, food intolerance.  She also has history of tobacco use.  She was last seen in the office on 03/08/21 by myself.   ? ?She states she was sick about 3 weeks ago and she is still not feeling well.  She reports having a lot of congestion.  She has headaches and felt like "someone hit me in the face".  She also reports hoarseness, coughing, shortness of breath.  She was needing to use her albuterol multiple times a day.  Denies any fevers.  She was taking mucinex that was helpful with the mucus.  She reports having continued cough, shortness of breath and drainage.  She states she hasn't been really able to walk her dogs like she use to.  She states she has been able to decrease albuterol use.  ?She did go to the UC with these symptoms and states she was told she had a "sinus infection and bronchiole infection".  She was prescribed prednisone x 5 days and prescribed Doxycyline but states had to stop this before the end (around day 5) as she developed thrush and sore throat.  So she went back to UC and got treated for thrush with nystatin.  This cleared the thrush. She states she was told not to worry with completing the doxycyline.  ?She is also noticing more asthma symptoms during more humid days.  ?She has continued her routine medications Trelegy daily, as well as singulair daily.  She does have Flovent inhaler as well and has used it in addition to Trelegy on some days. ?With her allergies she does still feel that her nose sprays are helpful at time.   ?She has not used nasal saline rinse and does states she needs get back to using this.  She wants to get a lavage system.  ?She is also noting itchy eyes with the increase in pollen count.  She feels the  Ryvent is not as effective as it use to be at the beginning and would like another alternative.  ?She continues to avoid dairy in diet.  ?She continues her journey to quit smoking.  ? ?Review of systems: ?Review of Systems  ?Constitutional:  Positive for fatigue.  ?HENT:  Positive for congestion, postnasal drip, sinus pressure, sore throat and voice change.   ?Eyes: Negative.   ?Respiratory:  Positive for cough and shortness of breath.   ?Cardiovascular: Negative.   ?Gastrointestinal: Negative.   ?Musculoskeletal: Negative.   ?Skin: Negative.   ?Allergic/Immunologic: Negative.   ?Neurological:  Positive for headaches.   ? ?All other systems negative unless noted above in HPI ? ?Past medical/social/surgical/family history have been reviewed and are unchanged unless specifically indicated below. ? ?No changes ? ?Medication List: ?Current Outpatient Medications  ?Medication Sig Dispense Refill  ? acetaminophen (TYLENOL) 325 MG tablet Take by mouth.    ? albuterol (VENTOLIN HFA) 108 (90 Base) MCG/ACT inhaler INHALE 2 PUFFS BY MOUTH EVERY 4 TO 6 HOURS AS NEEDED FOR COUGH OR WHEEZING OR SHORTNESS OF BREATH OR CHEST TIGHTNESS 8 g 2  ? allopurinol (ZYLOPRIM) 100 MG tablet Take 100 mg by mouth daily.    ? Ascorbic Acid (VITAMIN C PO) Take 1,000 mg by mouth daily.     ? azelastine (ASTELIN) 0.1 % nasal spray  Place 2 sprays into both nostrils 2 (two) times daily as needed for rhinitis or allergies.     ? B Complex Vitamins (VITAMIN B COMPLEX PO) Take 1 tablet by mouth daily.     ? busPIRone (BUSPAR) 5 MG tablet buspirone 5 mg tablet    ? Cholecalciferol (VITAMIN D3) 125 MCG (5000 UT) CAPS Take 5,000 Units by mouth daily.    ? colchicine 0.6 MG tablet Take 0.6 mg by mouth 2 (two) times daily as needed.    ? COMBIVENT RESPIMAT 20-100 MCG/ACT AERS respimat Inhale 1 puff into the lungs every 6 (six) hours as needed for wheezing or shortness of breath.     ? cyanocobalamin 100 MCG tablet Take by mouth.    ? diclofenac Sodium  (VOLTAREN) 1 % GEL diclofenac 1 % topical gel ? APPLY STRIP TOPICALLY TO THE AFFECTED AREA TWICE DAILY AS NEEDED FOR PAIN    ? EPINEPHrine 0.3 mg/0.3 mL IJ SOAJ injection Use as directed for life threatening allergic reactions only 2 each 3  ? ferrous sulfate 325 (65 FE) MG EC tablet Take 325 mg by mouth 3 (three) times daily with meals.    ? fluconazole (DIFLUCAN) 150 MG tablet fluconazole 150 mg tablet ? TAKE 1 TABLET BY MOUTH NOW FOR YEAST INFECTION. REPEAT IN 1 WEEK    ? fluticasone (FLONASE) 50 MCG/ACT nasal spray Place 2 sprays into both nostrils 2 (two) times daily as needed for allergies. 16 g 5  ? fluticasone (FLOVENT HFA) 110 MCG/ACT inhaler Flovent HFA 110 mcg/actuation aerosol inhaler ? INHALE 1 PUFF INTO THE LUNGS TWICE DAILY    ? Fluticasone-Umeclidin-Vilant (TRELEGY ELLIPTA) 200-62.5-25 MCG/INH AEPB INHALE 1 PUFF BY MOUTH ONCE DAILY TO PREVENT COUGH OR WHEEZING. RINSE, GARGLE, AND SPIT AFTER USE 60 each 5  ? furosemide (LASIX) 40 MG tablet furosemide 40 mg tablet ? TAKE 1 TABLET BY MOUTH EVERY MORNING AND ADDITIONAL 1 TABLET IN THE AFTERNOON AS NEEDED FOR SWELLING    ? guaiFENesin (MUCINEX) 600 MG 12 hr tablet Take by mouth.    ? HYDROcodone-acetaminophen (NORCO/VICODIN) 5-325 MG tablet Take 1 tablet by mouth every 8 (eight) hours.    ? ketoconazole (NIZORAL) 2 % shampoo Apply 1 application topically 2 (two) times a week.     ? lidocaine (LIDODERM) 5 % lidocaine 5 % topical patch    ? lisinopril (ZESTRIL) 10 MG tablet Take 1 tablet by mouth daily.    ? lisinopril-hydrochlorothiazide (ZESTORETIC) 10-12.5 MG tablet Take 1 tablet by mouth daily.    ? montelukast (SINGULAIR) 10 MG tablet Take 10 mg by mouth daily.    ? montelukast (SINGULAIR) 10 MG tablet Take 1 tablet by mouth daily.    ? Multiple Vitamins-Minerals (ZINC PO) Take by mouth.    ? NARCAN 4 MG/0.1ML LIQD nasal spray kit USE 1 SPRAY IN NOSE AS DIRECTED    ? nystatin (MYCOSTATIN/NYSTOP) powder Apply 1 application topically daily.    ?  omeprazole (PRILOSEC) 40 MG capsule Take 40 mg by mouth daily.    ? RYVENT 6 MG TABS TAKE 1 TABLET BY MOUTH TWICE DAILY 60 tablet 5  ? tiZANidine (ZANAFLEX) 4 MG tablet Take 4 mg by mouth at bedtime.     ? TRELEGY ELLIPTA 200-62.5-25 MCG/ACT AEPB Inhale 1 puff into the lungs daily. 28 each 1  ? triamcinolone cream (KENALOG) 0.5 % Apply topically 2 (two) times daily.    ? ?Current Facility-Administered Medications  ?Medication Dose Route Frequency Provider Last Rate Last Admin  ?  triamcinolone acetonide (KENALOG) 10 MG/ML injection 10 mg  10 mg Other Once Landis Martins, DPM      ? triamcinolone acetonide (KENALOG) 10 MG/ML injection 10 mg  10 mg Other Once Landis Martins, DPM      ?  ? ?Known medication allergies: ?Allergies  ?Allergen Reactions  ? Penicillins Anaphylaxis and Other (See Comments)  ?  Has patient had a PCN reaction causing immediate rash, facial/tongue/throat swelling, SOB or lightheadedness with hypotension: Yes ?Has patient had a PCN reaction causing severe rash involving mucus membranes or skin necrosis: No ?Has patient had a PCN reaction that required hospitalization: No ?Has patient had a PCN reaction occurring within the last 10 years: No ?If all of the above answers are "NO", then may proceed with Cephalosporin use.  ? Prozac [Fluoxetine Hcl] Hives, Swelling and Other (See Comments)  ?  Facial swelling  ? ? ? ?Physical examination: ?Blood pressure 112/62, pulse 92, resp. rate 16, SpO2 98 %. ? ?General: Alert, interactive, in no acute distress. ?HEENT: PERRLA, TMs pearly gray, turbinates non-edematous with thick discharge, post-pharynx non erythematous. ?Neck: Supple without lymphadenopathy. ?Lungs: Clear to auscultation without wheezing, rhonchi or rales. {no increased work of breathing. ?CV: Normal S1, S2 without murmurs. ?Abdomen: Nondistended, nontender. ?Skin: Warm and dry, without lesions or rashes. ?Extremities:  No clubbing, cyanosis or edema. ?Neuro:   Grossly  intact. ? ?Diagnositics/Labs: ?None today ? ?Assessment and plan: ?Allergic rhinitis ?  - continue avoidance measures for grass pollens, dust mite and cockroach.  ?  - Ryvent has become less effective over time thus will change to differ

## 2021-09-06 NOTE — Patient Instructions (Addendum)
Allergies ?  - continue avoidance measures for grass pollens, dust mite and cockroach.  ?  - Ryvent has become less effective over time thus will change to different antihistamine.   Discussed changing to Ryclora 42m twice a day (can take every 4-6 hours if needed) and will see if covered with insurance.    If not can go back to Allegra (samples provided) which is over-the-counter.  ?  - continue Singulair daily ?  - recommend performing nasal saline rinses to help clean/flush out the nose and sinus tract.  This also helps your nasal sprays work better if nose is cleaning. Rinse kit provided.  ?  - for nasal drainage use Astelin/Azelastine 2 sprays each nostril twice a day as needed ?  - use Flonase 2 sprays each nostril daily for the next 1 to 2 weeks for nasal congestion control.  Use for 1-2 weeks at a time if having nasal congestion or sinus pressure.   ?  - for itchy/watery eyes use Optivar 1 drop each eye twice a day as needed. Will see if this is covered by your insurance.  If not use Pataday 1 drop each eye daily as needed; sample provided and it is now over-the-counter.  ?  - continue allergen immunotherapy per schedule (at every 2 weeks now).  Have access to epinephrine device on injections days ? ?COPD with asthma ? - continue Trelegy 2053m 1 puff daily.   ? - for the next 1-2 weeks add in Flovent 2 puffs twice a day ? - after inhaler use Rinse/gargle mouth ? - continue Singulair 106maily at bedtime ? - have access to albuterol inhaler 2 puffs every 4-6 hours as needed for cough/wheeze/shortness of breath/chest tightness.  May use 15-20 minutes prior to activity.   Monitor frequency of use.   ? ?Control goals:  ?Full participation in all desired activities (may need albuterol before activity) ?Albuterol use two time or less a week on average (not counting use with activity) ?Cough interfering with sleep two time or less a month ?Oral steroids no more than once a year ?No hospitalizations ? ?Tobacco  use ? - continue your course to quitting!  Continue to decrease your daily cigarette use.   ? ?Food intolerance ? - skin testing to milk has been negative thus confirming dairy intolerance ? - continue avoidance of milk/ice cream to prevent GI symptoms ? ?Follow-up 6 months or sooner if needed ? ?

## 2021-09-14 ENCOUNTER — Ambulatory Visit (INDEPENDENT_AMBULATORY_CARE_PROVIDER_SITE_OTHER): Payer: Medicare Other | Admitting: *Deleted

## 2021-09-14 DIAGNOSIS — J309 Allergic rhinitis, unspecified: Secondary | ICD-10-CM

## 2021-09-23 ENCOUNTER — Other Ambulatory Visit: Payer: Self-pay | Admitting: Allergy

## 2021-09-29 ENCOUNTER — Ambulatory Visit (INDEPENDENT_AMBULATORY_CARE_PROVIDER_SITE_OTHER): Payer: Medicare Other | Admitting: *Deleted

## 2021-09-29 DIAGNOSIS — J309 Allergic rhinitis, unspecified: Secondary | ICD-10-CM

## 2021-10-04 ENCOUNTER — Ambulatory Visit (INDEPENDENT_AMBULATORY_CARE_PROVIDER_SITE_OTHER): Payer: Medicare Other | Admitting: Podiatrist

## 2021-10-04 ENCOUNTER — Encounter: Payer: Self-pay | Admitting: Podiatrist

## 2021-10-04 DIAGNOSIS — M779 Enthesopathy, unspecified: Secondary | ICD-10-CM | POA: Diagnosis not present

## 2021-10-10 NOTE — Progress Notes (Signed)
Chief Complaint  Patient presents with   Foot Pain    Would like to have an injection in the right ankle      HPI: Patient is 66 y.o. female who presents today for pain in the right ankle.  She has swelling in the ankle up to the knee and relates she has bruising and redness on the right leg from having a cast on the foot.  She relates it hurts to put the foot on the floor.     Allergies  Allergen Reactions   Penicillins Anaphylaxis and Other (See Comments)    Has patient had a PCN reaction causing immediate rash, facial/tongue/throat swelling, SOB or lightheadedness with hypotension: Yes Has patient had a PCN reaction causing severe rash involving mucus membranes or skin necrosis: No Has patient had a PCN reaction that required hospitalization: No Has patient had a PCN reaction occurring within the last 10 years: No If all of the above answers are "NO", then may proceed with Cephalosporin use.   Prozac [Fluoxetine Hcl] Hives, Swelling and Other (See Comments)    Facial swelling    Review of systems is negative except as noted in the HPI.  Denies nausea/ vomiting/ fevers/ chills or night sweats.   Denies difficulty breathing, denies calf pain or tenderness  Physical Exam  Patient is awake, alert, and oriented x 3.  In no acute distress.    Vascular status is intact with palpable pedal pulses 1/4 DP and PT bilateral and capillary refill time less than 3 seconds bilateral.  No edema or erythema noted.   Neurological exam reveals epicritic and protective sensation grossly intact bilateral.   Dermatological exam reveals skin is supple and dry to bilateral feet.  No open lesions present.  Digital nails asymptomatic today.   Musculoskeletal exam: pain on palpation sinus tarsi region of the right ankle.  Limited range of motion right ankle with dorsiflexion, plantarflexion, inversion and eversion noted.   Limited rom at first and second mpj with contracture of hammertoes noted.     Assessment:   ICD-10-CM   1. Capsulitis  M77.9        Plan: Discussed exam findings with the patient.  Short air fracture walker recommended and dispensed.   She will wear this for 3-5 days after the injection to help immobilize the foot and ankle.  An injection performed under sterile technique in the area of the sinus tarsi right with '10mg'$  kenalog and marcaine plain.  She will see how this does for her and will call if the injection fails to relieve her pain after 2 weeks.

## 2021-10-12 ENCOUNTER — Ambulatory Visit (INDEPENDENT_AMBULATORY_CARE_PROVIDER_SITE_OTHER): Payer: Medicare Other | Admitting: *Deleted

## 2021-10-12 DIAGNOSIS — J309 Allergic rhinitis, unspecified: Secondary | ICD-10-CM

## 2021-10-19 DIAGNOSIS — J301 Allergic rhinitis due to pollen: Secondary | ICD-10-CM | POA: Diagnosis not present

## 2021-10-19 NOTE — Progress Notes (Signed)
VIALS EXP 10-20-22

## 2021-10-20 DIAGNOSIS — J3089 Other allergic rhinitis: Secondary | ICD-10-CM | POA: Diagnosis not present

## 2021-10-24 ENCOUNTER — Ambulatory Visit (INDEPENDENT_AMBULATORY_CARE_PROVIDER_SITE_OTHER): Payer: Medicare Other | Admitting: *Deleted

## 2021-10-24 DIAGNOSIS — J309 Allergic rhinitis, unspecified: Secondary | ICD-10-CM

## 2021-11-08 ENCOUNTER — Ambulatory Visit (INDEPENDENT_AMBULATORY_CARE_PROVIDER_SITE_OTHER): Payer: Medicare Other | Admitting: *Deleted

## 2021-11-08 DIAGNOSIS — J309 Allergic rhinitis, unspecified: Secondary | ICD-10-CM

## 2021-11-21 ENCOUNTER — Other Ambulatory Visit: Payer: Self-pay | Admitting: Allergy

## 2021-11-21 ENCOUNTER — Ambulatory Visit (INDEPENDENT_AMBULATORY_CARE_PROVIDER_SITE_OTHER): Payer: Medicare Other | Admitting: *Deleted

## 2021-11-21 ENCOUNTER — Other Ambulatory Visit (HOSPITAL_COMMUNITY): Payer: Self-pay

## 2021-11-21 ENCOUNTER — Telehealth: Payer: Self-pay

## 2021-11-21 DIAGNOSIS — J309 Allergic rhinitis, unspecified: Secondary | ICD-10-CM

## 2021-11-21 NOTE — Telephone Encounter (Signed)
[facility] Patient Teacher, English as a foreign language completed.    The patient is insured through [id number].  Medication will need a PA.  We will continue to follow to see if copay assistance is needed.  Ileene Patrick, [facility] Patient [facility] Phone: [phone] Fax:  [phone]

## 2021-11-22 ENCOUNTER — Ambulatory Visit (INDEPENDENT_AMBULATORY_CARE_PROVIDER_SITE_OTHER): Payer: Medicare Other | Admitting: Family

## 2021-11-22 ENCOUNTER — Other Ambulatory Visit: Payer: Self-pay

## 2021-11-22 ENCOUNTER — Encounter: Payer: Self-pay | Admitting: Family

## 2021-11-22 VITALS — BP 147/80 | HR 54 | Temp 97.8°F | Resp 16 | Ht 65.0 in | Wt 173.2 lb

## 2021-11-22 DIAGNOSIS — B182 Chronic viral hepatitis C: Secondary | ICD-10-CM | POA: Diagnosis not present

## 2021-11-22 NOTE — Progress Notes (Signed)
Subjective:    Patient ID: Diana Oneill, female    DOB: [birth year], 66 y.o.   MRN: [id number]  Chief Complaint  Patient presents with   New Patient (Initial Visit)    Hep C -     HPI:  Diana Oneill is a 66 y.o. female with previous medical history as detailed below and relevant for chronic kidney disease (Stage 3) and chronic hepatitis C presenting today for initial evaluation of hepatitis C.  Diana Oneill was first diagnosed with hepatitis C approximately 4 to 5 years ago through the health department with risk factors including history of drug use and being born between [birth year] and 1964.  Has not received treatment to date.  No personal or family history of liver disease.  Currently without symptoms and denies abdominal pain, nausea, vomiting, scleral icterus, fatigue, or jaundice.  No current recreational/illicit drug use, alcohol consumption, and is a former tobacco smoker.  Lab work from internal medicine reviewed with negative HIV antibody, negative hepatitis B surface antigen, and positive hepatitis C antibody.   Allergies  Allergen Reactions   Penicillins Anaphylaxis and Other (See Comments)    Has patient had a PCN reaction causing immediate rash, facial/tongue/throat swelling, SOB or lightheadedness with hypotension: Yes Has patient had a PCN reaction causing severe rash involving mucus membranes or skin necrosis: No Has patient had a PCN reaction that required hospitalization: No Has patient had a PCN reaction occurring within the last 10 years: No If all of the above answers are "NO", then may proceed with Cephalosporin use.   Prozac [Fluoxetine Hcl] Hives, Swelling and Other (See Comments)    Facial swelling      Outpatient Medications Prior to Visit  Medication Sig Dispense Refill   acetaminophen (TYLENOL) 325 MG tablet Take by mouth.     albuterol (VENTOLIN HFA) 108 (90 Base) MCG/ACT inhaler INHALE 2 PUFFS BY MOUTH EVERY 4 TO 6 HOURS AS NEEDED FOR  COUGH OR WHEEZING OR SHORTNESS OF BREATH OR CHEST TIGHTNESS 8 g 2   allopurinol (ZYLOPRIM) 100 MG tablet Take 100 mg by mouth daily.     Ascorbic Acid (VITAMIN C PO) Take 1,000 mg by mouth daily.      azelastine (ASTELIN) 0.1 % nasal spray Place 2 sprays into both nostrils 2 (two) times daily as needed for rhinitis or allergies.      azelastine (OPTIVAR) 0.05 % ophthalmic solution Place 1 drop into both eyes 2 (two) times daily. 6 mL 5   B Complex Vitamins (VITAMIN B COMPLEX PO) Take 1 tablet by mouth daily.      busPIRone (BUSPAR) 5 MG tablet Take by mouth.     Carbinoxamine Maleate (RYVENT) 6 MG TABS Take by mouth.     Cholecalciferol (VITAMIN D3) 125 MCG (5000 UT) CAPS Take 5,000 Units by mouth daily.     colchicine 0.6 MG tablet Take 0.6 mg by mouth 2 (two) times daily as needed.     COMBIVENT RESPIMAT 20-100 MCG/ACT AERS respimat Inhale 1 puff into the lungs every 6 (six) hours as needed for wheezing or shortness of breath.      cyanocobalamin 100 MCG tablet Take by mouth.     diclofenac Sodium (VOLTAREN) 1 % GEL diclofenac 1 % topical gel  APPLY STRIP TOPICALLY TO THE AFFECTED AREA TWICE DAILY AS NEEDED FOR PAIN     EPINEPHrine 0.3 mg/0.3 mL IJ SOAJ injection Use as directed for life threatening allergic reactions only 2 each 3  ferrous sulfate 325 (65 FE) MG EC tablet Take 325 mg by mouth 3 (three) times daily with meals.     fluticasone (FLONASE) 50 MCG/ACT nasal spray Place 2 sprays into both nostrils 2 (two) times daily as needed for allergies. 16 g 5   fluticasone (FLOVENT HFA) 110 MCG/ACT inhaler INHALE 2 PUFF INTO THE LUNGS TWICE DAILY 1 each 5   furosemide (LASIX) 40 MG tablet furosemide 40 mg tablet  TAKE 1 TABLET BY MOUTH EVERY MORNING AND ADDITIONAL 1 TABLET IN THE AFTERNOON AS NEEDED FOR SWELLING     gabapentin (NEURONTIN) 300 MG capsule Take by mouth 2 (two) times daily.     guaiFENesin (MUCINEX) 600 MG 12 hr tablet Take by mouth.     HYDROcodone-acetaminophen  (NORCO/VICODIN) 5-325 MG tablet Take 1 tablet by mouth every 8 (eight) hours.     ketoconazole (NIZORAL) 2 % shampoo Apply 1 application topically 2 (two) times a week.      lidocaine (LIDODERM) 5 % lidocaine 5 % topical patch     lisinopril (ZESTRIL) 10 MG tablet Take 1 tablet by mouth daily.     lisinopril-hydrochlorothiazide (ZESTORETIC) 10-12.5 MG tablet Take 1 tablet by mouth daily.     montelukast (SINGULAIR) 10 MG tablet Take 1 tablet by mouth daily.     Multiple Vitamins-Minerals (ZINC PO) Take by mouth.     NARCAN 4 MG/0.1ML LIQD nasal spray kit USE 1 SPRAY IN NOSE AS DIRECTED     nystatin (MYCOSTATIN) 100000 UNIT/ML suspension Take by mouth.     omeprazole (PRILOSEC) 40 MG capsule Take 40 mg by mouth daily.     RYVENT 6 MG TABS TAKE 1 TABLET BY MOUTH TWICE DAILY 60 tablet 5   tiZANidine (ZANAFLEX) 4 MG tablet Take 4 mg by mouth at bedtime.      TRELEGY ELLIPTA 200-62.5-25 MCG/ACT AEPB INHALE 1 PUFF INTO THE LUNGS DAILY 60 each 3   triamcinolone cream (KENALOG) 0.5 % Apply topically 2 (two) times daily.     Dexchlorpheniramine Maleate (RYCLORA) 2 MG/5ML SOLN 67m twice a day.Can take every 4-6 hours if needed 118 mL 5   escitalopram (LEXAPRO) 20 MG tablet Take by mouth.     loratadine (CLARITIN) 10 MG tablet Take by mouth.     ondansetron (ZOFRAN) 4 MG tablet Take by mouth.     predniSONE (DELTASONE) 10 MG tablet Take by mouth.     Facility-Administered Medications Prior to Visit  Medication Dose Route Frequency Provider Last Rate Last Admin   triamcinolone acetonide (KENALOG) 10 MG/ML injection 10 mg  10 mg Other Once SLandis Martins DPM       triamcinolone acetonide (KENALOG) 10 MG/ML injection 10 mg  10 mg Other Once SLandis Martins DPM         Past Medical History:  Diagnosis Date   Acidosis 10/25/2016   Acute encephalopathy 10/25/2016   [facility] kidney [facility]    Acute respiratory failure ([facility] 10/25/2016   Anemia    Ankle pain 07/10/2017   Anxiety    no meds    Arthritis    Asthma    B[place] disorder, now depressed ([place] 08/10/2014   not on meds at this time   Cancer ([facility]    skin cancer, uterine cancer   CKD (chronic kidney disease), stage III ([place]    Closed displaced trimalleolar fracture of right ankle 01/05/2017   Cocaine use disorder, mild, in early remission ([facility] 08/10/2014   COPD (chronic obstructive pulmonary disease) ([facility] [place]  Depression    Dyspnea    with exertion   Essential hypertension 10/25/2016   takes meds as needed   History of kidney stones    Hypertension    Hyponatremia    Nausea without vomiting 10/25/2016   Obese    Obesity 10/25/2016   Osteoarthritis    Osteoarthritis of left knee 10/23/2016   Sleep apnea    does not use CPAP "Makes too much Noise"   Tachycardia 10/25/2016   Thoracic aortic aneurysm ([place]) 09/2016   4.1 cm ascending thoracic aortic aneurysm.   Thrombocytopenia ([place]) 09/2016     Past Surgical History:  Procedure Laterality Date   ABDOMINAL HYSTERECTOMY  1988   ACHILLES TENDON REPAIR Left    CARPAL TUNNEL RELEASE Bilateral 2007   CATARACT EXTRACTION, BILATERAL  2020   FRACTURE SURGERY Right 2005   wrist   GASTRIC BYPASS     JOINT REPLACEMENT Right    knee replace   KNEE ARTHROPLASTY Left 10/23/2016   Procedure: COMPUTER ASSISTED TOTAL KNEE ARTHROPLASTY;  Surgeon: Rod Can, MD;  Location: [place];  Service: Orthopedics;  Laterality: Left;   ORIF ANKLE FRACTURE Right 01/06/2017   Procedure: OPEN REDUCTION INTERNAL FIXATION (ORIF) ANKLE FRACTURE;  Surgeon: Wylene Simmer, MD;  Location: [place];  Service: Orthopedics;  Laterality: Right;   ROTATOR CUFF REPAIR  2021   TONSILLECTOMY  1968   TOTAL KNEE REVISION Left 11/07/2019   Procedure: TOTAL KNEE REVISION;  Surgeon: Rod Can, MD;  Location[facility];  Service: Orthopedics;  Laterality: Left;       Review of Systems  Constitutional:  Negative for chills, fatigue, fever and unexpected weight change.  Respiratory:  Negative for cough, chest  tightness, shortness of breath and wheezing.   Cardiovascular:  Negative for chest pain and leg swelling.  Gastrointestinal:  Negative for abdominal distention, constipation, diarrhea, nausea and vomiting.  Neurological:  Negative for dizziness, weakness, light-headedness and headaches.  Hematological:  Does not bruise/bleed easily.      Objective:    BP (!) 147/80   Pulse (!) 54   Temp 97.8 F (36.6 C) (Oral)   Resp 16   Ht 5' 5"  (1.651 m)   Wt 173 lb 3.2 oz (78.6 kg)   SpO2 100%   BMI 28.82 kg/m  Nursing note and vital signs reviewed.  Physical Exam Constitutional:      General: She is not in acute distress.    Appearance: She is well-developed.  Cardiovascular:     Rate and Rhythm: Normal rate and regular rhythm.     Heart sounds: Normal heart sounds. No murmur heard.    No friction rub. No gallop.  Pulmonary:     Effort: Pulmonary effort is normal. No respiratory distress.     Breath sounds: Normal breath sounds. No wheezing or rales.  Chest:     Chest wall: No tenderness.  Abdominal:     General: Bowel sounds are normal. There is no distension.     Palpations: Abdomen is soft. There is no mass.     Tenderness: There is no abdominal tenderness. There is no guarding or rebound.  Skin:    General: Skin is warm and dry.  Neurological:     Mental Status: She is alert and oriented to person, place, and time.  Psychiatric:        Behavior: Behavior normal.        Thought Content: Thought content normal.        Judgment: Judgment normal.  11/22/2021    1:50 PM  Depression screen PHQ 2/9  Decreased Interest 0  Down, Depressed, Hopeless 0  PHQ - 2 Score 0       Assessment & Plan:    Patient Active Problem List   Diagnosis Date Noted   Chronic hepatitis C without hepatic coma ([place]) 11/22/2021   Chronic pain syndrome 01/17/2021   Primary osteoarthritis involving multiple joints 01/17/2021   Hypermagnesemia 12/24/2019   Failed total knee, left ([place])  11/07/2019   S/P revision of total knee, left 11/07/2019   Sinus bradycardia on ECG 10/20/2019   Morbid (severe) obesity with alveolar hypoventilation (H[facility]) 09/12/2019   Moderate persistent asthma without complication 29/56/2130   Seasonal and perennial allergic rhinitis 06/23/2019   Stage 3 chronic kidney disease ([place]) 06/23/2019   Tobacco use 06/23/2019   Coronavirus infection 04/21/2019   Preoperative cardiovascular examination 04/18/2019   Overweight 04/18/2019   Cigarette smoker 04/18/2019   Chronic knee pain after total replacement of left knee joint 03/05/2019   Ankle pain 07/10/2017   Closed displaced trimalleolar fracture of right ankle 01/05/2017   Acute encephalopathy 10/25/2016   Essential hypertension 10/25/2016   Obesity 10/25/2016   Acidosis 10/25/2016   Nausea without vomiting 10/25/2016   Tachycardia 10/25/2016   COPD (chronic obstructive pulmonary disease) ([place]) 10/25/2016   Acute respiratory failure ([place]) 10/25/2016   Hyponatremia    Acute kidney injury ([place])    Anemia    Thrombocytopenia ([place])    Osteoarthritis of left knee 10/23/2016   Bipolar disorder, now depressed ([place]) 08/10/2014   Cocaine use disorder, mild, in early remission ([place]) 08/10/2014   Cocaine use 08/10/2014     Problem List Items Addressed This Visit       Digestive   Chronic hepatitis C without hepatic coma ([place]) - Primary    Diana Oneill is a 66 year old female with chronic hepatitis C with risk factors including previous history of drug use and being born between [birth year] and 1964.  Treatment nave and asymptomatic.  We reviewed the basics of hepatitis C including transmission, risks of left untreated, lab work, medications, financial assistance, and treatment plan.  Check hepatitis C lab work.  There is a possibility her hepatitis C RNA is undetectable and if this is the case we will not need treatment.  Tentatively plan for 12 weeks of Epclusa. Note she does take the Zanaflex nightly but  does not take the omeprazole or colchicine regularly which have interactions with medications.       Relevant Orders   CBC   Hepatic function panel   Hepatitis C genotype   Hepatitis C RNA quantitative   Liver Fibrosis, FibroTest-ActiTest   Protime-INR     I am having Cari Caraway maintain her Combivent Respimat, tiZANidine, ketoconazole, azelastine, Ascorbic Acid (VITAMIN C PO), B Complex Vitamins (VITAMIN B COMPLEX PO), lisinopril-hydrochlorothiazide, ferrous sulfate, Vitamin D3, HYDROcodone-acetaminophen, EPINEPHrine, lisinopril, Narcan, Multiple Vitamins-Minerals (ZINC PO), fluticasone, albuterol, acetaminophen, allopurinol, colchicine, cyanocobalamin, diclofenac Sodium, furosemide, guaiFENesin, lidocaine, montelukast, omeprazole, triamcinolone cream, RyClora, azelastine, fluticasone, RyVent, busPIRone, Carbinoxamine Maleate, escitalopram, gabapentin, loratadine, nystatin, ondansetron, predniSONE, and Trelegy Ellipta. We will continue to administer triamcinolone acetonide and triamcinolone acetonide.   Follow-up: 1 month after starting medication or sooner if needed.    Terri Piedra, MSN, FNP-C Nurse Practitioner [facility] for Infectious Disease [place] number: [phone]

## 2021-11-22 NOTE — Patient Instructions (Signed)
Nice to see you.  We will check your lab work today.  Have a great day and stay safe!  Limit acetaminophen (Tylenol) usage to no more than 2 grams (2,000 mg) per day.  Avoid alcohol.  Do not share toothbrushes or razors.  Practice safe sex to protect against transmission as well as sexually transmitted disease.    Hepatitis C Hepatitis C is a viral infection of the liver. It can lead to scarring of the liver (cirrhosis), liver failure, or liver cancer. Hepatitis C may go undetected for months or years because people with the infection may not have symptoms, or they may have only mild symptoms. What are the causes? This condition is caused by the hepatitis C virus (HCV). The virus can spread from person to person (is contagious) through: Blood. Childbirth. A woman who has hepatitis C can pass it to her baby during birth. Bodily fluids, such as breast milk, tears, semen, vaginal fluids, and saliva. Blood transfusions or organ transplants done in the [place] before 1992.  What increases the risk? The following factors may make you more likely to develop this condition: Having contact with unclean (contaminated) needles or syringes. This may result from: Acupuncture. Tattoing. Body piercing. Injecting drugs. Having unprotected sex with someone who is infected. Needing treatment to filter your blood (kidney dialysis). Having HIV (human immunodeficiency virus) or AIDS (acquired immunodeficiency syndrome). Working in a job that involves contact with blood or bodily fluids, such as health care.  What are the signs or symptoms? Symptoms of this condition include: Fatigue. Loss of appetite. Nausea. Vomiting. Abdominal pain. Dark yellow urine. Yellowish skin and eyes (jaundice). Itchy skin. Clay-colored bowel movements. Joint pain. Bleeding and bruising easily. Fluid building up in your stomach (ascites).  In some cases, you may not have any symptoms. How is this  diagnosed? This condition is diagnosed with: Blood tests. Other tests to check how well your liver is functioning. They may include: Magnetic resonance elastography (MRE). This imaging test uses MRIs and sound waves to measure liver stiffness. Transient elastography. This imaging test uses ultrasounds to measure liver stiffness. Liver biopsy. This test requires taking a small tissue sample from your liver to examine it under a microscope.  How is this treated? Your health care provider may perform noninvasive tests or a liver biopsy to help decide the best course of treatment. Treatment may include: Antiviral medicines and other medicines. Follow-up treatments every 6-12 months for infections or other liver conditions. Receiving a donated liver (liver transplant).  Follow these instructions at home: Medicines Take over-the-counter and prescription medicines only as told by your health care provider. Take your antiviral medicine as told by your health care provider. Do not stop taking the antiviral even if you start to feel better. Do not take any medicines unless approved by your health care provider, including over-the-counter medicines and birth control pills. Activity Rest as needed. Do not have sex unless approved by your health care provider. Ask your health care provider when you may return to school or work. Eating and drinking Eat a balanced diet with plenty of fruits and vegetables, whole grains, and lowfat (lean) meats or non-meat proteins (such as beans or tofu). Drink enough fluids to keep your urine clear or pale yellow. Do not drink alcohol. General instructions Do not share toothbrushes, nail clippers, or razors. Wash your hands frequently with soap and water. If soap and water are not available, use hand sanitizer. Cover any cuts or open sores on your   skin to prevent spreading the virus. Keep all follow-up visits as told by your health care provider. This is important.  You may need follow-up visits every 6-12 months. How is this prevented? There is no vaccine for hepatitis C. The only way to prevent the disease is to reduce the risk of exposure to the virus. Make sure you: Wash your hands frequently with soap and water. If soap and water are not available, use hand sanitizer. Do not share needles or syringes. Practice safe sex and use condoms. Avoid handling blood or bodily fluids without gloves or other protection. Avoid getting tattoos or piercings in shops or other locations that are not clean.  Contact a health care provider if: You have a fever. You develop abdominal pain. You pass dark urine. You pass clay-colored stools. You develop joint pain. Get help right away if: You have increasing fatigue or weakness. You lose your appetite. You cannot eat or drink without vomiting. You develop jaundice or your jaundice gets worse. You bruise or bleed easily. Summary Hepatitis C is a viral infection of the liver. It can lead to scarring of the liver (cirrhosis), liver failure, or liver cancer. The hepatitis C virus (HCV) causes this condition. The virus can pass from person to person (is contagious). You should not take any medicines unless approved by your health care provider. This includes over-the-counter medicines and birth control pills. This information is not intended to replace advice given to you by your health care provider. Make sure you discuss any questions you have with your health care provider. Document Released: 04/14/2000 Document Revised: 05/23/2016 Document Reviewed: 05/23/2016 Elsevier Interactive Patient Education  2018 Elsevier Inc.  

## 2021-11-22 NOTE — Assessment & Plan Note (Signed)
Diana Oneill is a 66 year old female with chronic hepatitis C with risk factors including previous history of drug use and being born between 54 and 1964.  Treatment nave and asymptomatic.  We reviewed the basics of hepatitis C including transmission, risks of left untreated, lab work, medications, financial assistance, and treatment plan.  Check hepatitis C lab work.  There is a possibility her hepatitis C RNA is undetectable and if this is the case we will not need treatment.  Tentatively plan for 12 weeks of Epclusa. Note she does take the Zanaflex nightly but does not take the omeprazole or colchicine regularly which have interactions with medications.

## 2021-11-26 LAB — CBC
HCT: 36.6 % (ref 35.0–45.0)
Hemoglobin: 12 g/dL (ref 11.7–15.5)
MCH: 29.6 pg (ref 27.0–33.0)
MCHC: 32.8 g/dL (ref 32.0–36.0)
MCV: 90.4 fL (ref 80.0–100.0)
MPV: 11 fL (ref 7.5–12.5)
Platelets: 173 10*3/uL (ref 140–400)
RBC: 4.05 10*6/uL (ref 3.80–5.10)
RDW: 15.4 % — ABNORMAL HIGH (ref 11.0–15.0)
WBC: 5.6 10*3/uL (ref 3.8–10.8)

## 2021-11-26 LAB — LIVER FIBROSIS, FIBROTEST-ACTITEST
ALT: 13 U/L (ref 6–29)
Alpha-2-Macroglobulin: 203 mg/dL (ref 106–279)
Apolipoprotein A1: 187 mg/dL (ref 101–198)
Bilirubin: 0.3 mg/dL (ref 0.2–1.2)
Fibrosis Score: 0.14
GGT: 21 U/L (ref 3–65)
Haptoglobin: 77 mg/dL (ref 43–212)
Necroinflammat ACT Score: 0.03
Reference ID: 4475946

## 2021-11-26 LAB — HEPATITIS C RNA QUANTITATIVE
HCV Quantitative Log: <1.18 log IU/mL
HCV RNA, PCR, QN: <15 IU/mL

## 2021-11-26 LAB — HEPATIC FUNCTION PANEL
AG Ratio: 2 (calc) (ref 1.0–2.5)
ALT: 15 U/L (ref 6–29)
AST: 22 U/L (ref 10–35)
Albumin: 4.1 g/dL (ref 3.6–5.1)
Alkaline phosphatase (APISO): 60 U/L (ref 37–153)
Bilirubin, Direct: 0.1 mg/dL (ref 0.0–0.2)
Globulin: 2.1 g/dL (calc) (ref 1.9–3.7)
Indirect Bilirubin: 0.2 mg/dL (calc) (ref 0.2–1.2)
Total Bilirubin: 0.3 mg/dL (ref 0.2–1.2)
Total Protein: 6.2 g/dL (ref 6.1–8.1)

## 2021-11-26 LAB — PROTIME-INR
INR: 1
Prothrombin Time: 10.8 s (ref 9.0–11.5)

## 2021-11-26 LAB — HEPATITIS C GENOTYPE: HCV Genotype: NOT DETECTED

## 2021-11-28 ENCOUNTER — Ambulatory Visit (INDEPENDENT_AMBULATORY_CARE_PROVIDER_SITE_OTHER): Payer: Medicare Other | Admitting: *Deleted

## 2021-11-28 ENCOUNTER — Telehealth: Payer: Self-pay

## 2021-11-28 DIAGNOSIS — J309 Allergic rhinitis, unspecified: Secondary | ICD-10-CM

## 2021-11-28 NOTE — Telephone Encounter (Signed)
Patient called office to review results. Relayed message that test came back negative and does not need treatment. Would like to ask provider why test came back positive; states she was testing around 6-7 years ago and had something similar happen. Would like to know if provider has any idea why this happened. Will forward question. Leatrice Jewels, RMA

## 2021-12-05 ENCOUNTER — Ambulatory Visit (INDEPENDENT_AMBULATORY_CARE_PROVIDER_SITE_OTHER): Payer: Medicare Other | Admitting: *Deleted

## 2021-12-05 DIAGNOSIS — J309 Allergic rhinitis, unspecified: Secondary | ICD-10-CM | POA: Diagnosis not present

## 2021-12-14 ENCOUNTER — Ambulatory Visit (INDEPENDENT_AMBULATORY_CARE_PROVIDER_SITE_OTHER): Payer: Medicare Other | Admitting: *Deleted

## 2021-12-14 DIAGNOSIS — J309 Allergic rhinitis, unspecified: Secondary | ICD-10-CM

## 2021-12-19 ENCOUNTER — Ambulatory Visit (INDEPENDENT_AMBULATORY_CARE_PROVIDER_SITE_OTHER): Payer: Medicare Other | Admitting: *Deleted

## 2021-12-19 DIAGNOSIS — J309 Allergic rhinitis, unspecified: Secondary | ICD-10-CM | POA: Diagnosis not present

## 2022-01-03 ENCOUNTER — Ambulatory Visit (INDEPENDENT_AMBULATORY_CARE_PROVIDER_SITE_OTHER): Payer: Medicare Other | Admitting: *Deleted

## 2022-01-03 DIAGNOSIS — J309 Allergic rhinitis, unspecified: Secondary | ICD-10-CM

## 2022-01-18 ENCOUNTER — Ambulatory Visit (INDEPENDENT_AMBULATORY_CARE_PROVIDER_SITE_OTHER): Payer: Medicare Other

## 2022-01-18 DIAGNOSIS — J309 Allergic rhinitis, unspecified: Secondary | ICD-10-CM

## 2022-01-27 ENCOUNTER — Ambulatory Visit: Payer: Medicare Other

## 2022-01-27 ENCOUNTER — Ambulatory Visit (INDEPENDENT_AMBULATORY_CARE_PROVIDER_SITE_OTHER): Payer: Medicare Other | Admitting: Podiatry

## 2022-01-27 DIAGNOSIS — M779 Enthesopathy, unspecified: Secondary | ICD-10-CM

## 2022-01-27 DIAGNOSIS — M25571 Pain in right ankle and joints of right foot: Secondary | ICD-10-CM | POA: Diagnosis not present

## 2022-01-27 DIAGNOSIS — M1250 Traumatic arthropathy, unspecified site: Secondary | ICD-10-CM | POA: Diagnosis not present

## 2022-01-27 DIAGNOSIS — G8929 Other chronic pain: Secondary | ICD-10-CM

## 2022-01-27 NOTE — Progress Notes (Unsigned)
Subjective:  Patient ID: Diana Oneill, female    DOB: December 23, 1955,  MRN: [id number]  Chief Complaint  Patient presents with   Ankle Pain    Top of right foot and right ankle pain/ req injections.     66 y.o. female presents with  ***  Past Medical History:  Diagnosis Date   Acidosis 10/25/2016   Acute encephalopathy 10/25/2016   Acute kidney injury ([place])    Acute respiratory failure ([place]) 10/25/2016   Anemia    Ankle pain 07/10/2017   Anxiety    no meds   Arthritis    Asthma    Bipolar disorder, now depressed ([place]) 08/10/2014   not on meds at this time   Cancer [facility])    skin cancer, uterine cancer   CKD (chronic kidney disease), stage III ([place])    Closed displaced trimalleolar fracture of right ankle 01/05/2017   Cocaine use disorder, mild, in early remission ([place]) 08/10/2014   COPD (chronic obstructive pulmonary disease) ([place])    Depression    Dyspnea    with exertion   Essential hypertension 10/25/2016   takes meds as needed   History of kidney stones    Hypertension    Hyponatremia    Nausea without vomiting 10/25/2016   Obese    Obesity 10/25/2016   Osteoarthritis    Osteoarthritis of left knee 10/23/2016   Sleep apnea    does not use CPAP "Makes too much Noise"   Tachycardia 10/25/2016   Thoracic aortic aneurysm ([place]) 09/2016   4.1 cm ascending thoracic aortic aneurysm.   Thrombocytopenia ([place]) 09/2016    Allergies  Allergen Reactions   Penicillins Anaphylaxis and Other (See Comments)    Has patient had a PCN reaction causing immediate rash, facial/tongue/throat swelling, SOB or lightheadedness with hypotension: Yes Has patient had a PCN reaction causing severe rash involving mucus membranes or skin necrosis: No Has patient had a PCN reaction that required hospitalization: No Has patient had a PCN reaction occurring within the last 10 years: No If all of the above answers are "NO", then may proceed with Cephalosporin use.   Prozac [Fluoxetine Hcl] Hives,  Swelling and Other (See Comments)    Facial swelling    ROS: Negative except as per HPI above  Objective:  General: AAO x3, NAD  Dermatological: With inspection and palpation of the right and left lower extremities there are no open sores, no preulcerative lesions, no rash or signs of infection present. Nails are of normal length thickness and coloration.   Vascular:  Dorsalis Pedis artery and Posterior Tibial artery pedal pulses are 2/4 bilateral.  Capillary fill time brisk < 3 sec. Pedal hair growth present. No varicosities and no lower extremity edema present bilateral. There is no pain with calf compression, swelling, warmth, erythema.   Neruologic: Grossly intact via light touch bilateral. Protective threshold intact to all sites bilateral. Patellar and Achilles deep tendon reflexes 2+ bilateral. Negative Babinski reflex.   Musculoskeletal: No gross boney pedal deformities bilateral. No pain, crepitus, or limitation noted with foot and ankle range of motion bilateral. Muscular strength 5/5 in all groups tested bilateral.  Gait: Unassisted, Nonantalgic.   No images are attached to the encounter.  Radiographs:  Date: *** XR {right/left foot:16097} Weightbearing AP/Lateral/Oblique   Findings: {MP Foot XR:23762::"no fracture, dislocation, swelling or degenerative changes noted"} Assessment:   1. Capsulitis      Plan:  Patient was evaluated and treated and all questions answered.  #*** -***  No  follow-ups on file.          Everitt Amber, DPM [facility]

## 2022-01-31 ENCOUNTER — Ambulatory Visit (INDEPENDENT_AMBULATORY_CARE_PROVIDER_SITE_OTHER): Payer: Medicare Other | Admitting: *Deleted

## 2022-01-31 DIAGNOSIS — J309 Allergic rhinitis, unspecified: Secondary | ICD-10-CM | POA: Diagnosis not present

## 2022-02-08 DIAGNOSIS — J301 Allergic rhinitis due to pollen: Secondary | ICD-10-CM

## 2022-02-08 NOTE — Progress Notes (Signed)
VIALS EXP 02-09-23

## 2022-02-09 DIAGNOSIS — J3089 Other allergic rhinitis: Secondary | ICD-10-CM

## 2022-02-14 ENCOUNTER — Ambulatory Visit (INDEPENDENT_AMBULATORY_CARE_PROVIDER_SITE_OTHER): Payer: Medicare Other | Admitting: *Deleted

## 2022-02-14 DIAGNOSIS — J309 Allergic rhinitis, unspecified: Secondary | ICD-10-CM

## 2022-03-09 ENCOUNTER — Ambulatory Visit (INDEPENDENT_AMBULATORY_CARE_PROVIDER_SITE_OTHER): Payer: Medicare Other | Admitting: *Deleted

## 2022-03-09 DIAGNOSIS — J309 Allergic rhinitis, unspecified: Secondary | ICD-10-CM | POA: Diagnosis not present

## 2022-03-10 ENCOUNTER — Ambulatory Visit (INDEPENDENT_AMBULATORY_CARE_PROVIDER_SITE_OTHER): Payer: Medicare Other | Admitting: Podiatry

## 2022-03-10 DIAGNOSIS — M1250 Traumatic arthropathy, unspecified site: Secondary | ICD-10-CM | POA: Diagnosis not present

## 2022-03-10 DIAGNOSIS — G8929 Other chronic pain: Secondary | ICD-10-CM

## 2022-03-10 DIAGNOSIS — I739 Peripheral vascular disease, unspecified: Secondary | ICD-10-CM | POA: Diagnosis not present

## 2022-03-10 DIAGNOSIS — M25571 Pain in right ankle and joints of right foot: Secondary | ICD-10-CM

## 2022-03-10 NOTE — Progress Notes (Signed)
Chief Complaint  Patient presents with   Foot Pain    right foot pain/ pt req injection     HPI: Patient is 66 y.o. female who presents today for pain in the right ankle.  She reports chronic pain in the right ankle.  Has had prior steroid injections and is coming in requesting another steroid injection in the right ankle.  She reports she had a history of severe fracture to the right ankle in the past and this is causing her pain as she has arthritis and chronic edema there.  She is coming in today because she has upcoming activity planned and will be on her feet a lot for the holidays wants to get a steroid injection now to preemptively treat ankle pain.   Allergies  Allergen Reactions   Penicillins Anaphylaxis and Other (See Comments)    Has patient had a PCN reaction causing immediate rash, facial/tongue/throat swelling, SOB or lightheadedness with hypotension: Yes Has patient had a PCN reaction causing severe rash involving mucus membranes or skin necrosis: No Has patient had a PCN reaction that required hospitalization: No Has patient had a PCN reaction occurring within the last 10 years: No If all of the above answers are "NO", then may proceed with Cephalosporin use.   Prozac [Fluoxetine Hcl] Hives, Swelling and Other (See Comments)    Facial swelling    Review of systems is negative except as noted in the HPI.  Denies nausea/ vomiting/ fevers/ chills or night sweats.   Denies difficulty breathing, denies calf pain or tenderness  Physical Exam  Patient is awake, alert, and oriented x 3.  In no acute distress.    Vascular status is intact with palpable pedal pulses 1/4 DP and PT bilateral and capillary refill time less than 3 seconds bilateral.  No edema or erythema noted.   Neurological exam reveals epicritic and protective sensation grossly intact bilateral.   Dermatological exam reveals skin is supple and dry to bilateral feet.  No open lesions present.  Digital nails  asymptomatic today.   Musculoskeletal exam: pain on palpation sinus tarsi region of the right ankle.  Limited range of motion right ankle with dorsiflexion, plantarflexion, inversion and eversion noted.   Limited rom at first and second mpj with contracture of hammertoes noted.    Assessment:   ICD-10-CM   1. 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 Chronic pain of right ankle  M25.571    G89.29     3. PVD (peripheral vascular disease) (HCC)  I73.9        Plan: Discussed exam findings with the patient.  Discussed that we can provide another steroid injection however she can only have so many of these in a year.  This will be her third recently.  Recommend continued conservative therapy including bracing ice compression anti-inflammatory medications as able to tolerate.  Procedure after sterile skin was prepped with alcohol swab an injection performed under sterile technique in the area of the tibiotalar joint central aspect right with 1 mL '10mg'$  kenalog and 1 mL 0.5% marcaine plain.    Patient will follow-up as needed if she has ongoing pain in the right ankle in the future.        Everitt Amber, DPM [facility]                   03/10/2022

## 2022-03-20 ENCOUNTER — Ambulatory Visit (INDEPENDENT_AMBULATORY_CARE_PROVIDER_SITE_OTHER): Payer: Medicare Other | Admitting: *Deleted

## 2022-03-20 DIAGNOSIS — J309 Allergic rhinitis, unspecified: Secondary | ICD-10-CM

## 2022-03-28 ENCOUNTER — Ambulatory Visit: Payer: Medicare Other | Admitting: Allergy

## 2022-04-04 ENCOUNTER — Ambulatory Visit (INDEPENDENT_AMBULATORY_CARE_PROVIDER_SITE_OTHER): Payer: Medicare Other

## 2022-04-04 DIAGNOSIS — J309 Allergic rhinitis, unspecified: Secondary | ICD-10-CM

## 2022-04-12 ENCOUNTER — Ambulatory Visit (INDEPENDENT_AMBULATORY_CARE_PROVIDER_SITE_OTHER): Payer: Medicare Other

## 2022-04-12 DIAGNOSIS — J309 Allergic rhinitis, unspecified: Secondary | ICD-10-CM

## 2022-04-18 ENCOUNTER — Ambulatory Visit (INDEPENDENT_AMBULATORY_CARE_PROVIDER_SITE_OTHER): Payer: Medicare Other

## 2022-04-18 DIAGNOSIS — J309 Allergic rhinitis, unspecified: Secondary | ICD-10-CM | POA: Diagnosis not present

## 2022-04-26 ENCOUNTER — Ambulatory Visit (INDEPENDENT_AMBULATORY_CARE_PROVIDER_SITE_OTHER): Payer: Medicare Other | Admitting: *Deleted

## 2022-04-26 DIAGNOSIS — J309 Allergic rhinitis, unspecified: Secondary | ICD-10-CM | POA: Diagnosis not present

## 2022-05-03 ENCOUNTER — Ambulatory Visit (INDEPENDENT_AMBULATORY_CARE_PROVIDER_SITE_OTHER): Payer: Medicare Other | Admitting: *Deleted

## 2022-05-03 DIAGNOSIS — J309 Allergic rhinitis, unspecified: Secondary | ICD-10-CM | POA: Diagnosis not present

## 2022-05-10 ENCOUNTER — Ambulatory Visit (INDEPENDENT_AMBULATORY_CARE_PROVIDER_SITE_OTHER): Payer: 59 | Admitting: *Deleted

## 2022-05-10 DIAGNOSIS — J309 Allergic rhinitis, unspecified: Secondary | ICD-10-CM

## 2022-05-12 ENCOUNTER — Ambulatory Visit: Payer: 59

## 2022-05-12 ENCOUNTER — Ambulatory Visit (INDEPENDENT_AMBULATORY_CARE_PROVIDER_SITE_OTHER): Payer: 59 | Admitting: Podiatry

## 2022-05-12 VITALS — BP 112/64

## 2022-05-12 DIAGNOSIS — M25571 Pain in right ankle and joints of right foot: Secondary | ICD-10-CM

## 2022-05-12 DIAGNOSIS — S99921A Unspecified injury of right foot, initial encounter: Secondary | ICD-10-CM

## 2022-05-12 DIAGNOSIS — Z969 Presence of functional implant, unspecified: Secondary | ICD-10-CM | POA: Diagnosis not present

## 2022-05-12 DIAGNOSIS — S9031XA Contusion of right foot, initial encounter: Secondary | ICD-10-CM | POA: Diagnosis not present

## 2022-05-12 DIAGNOSIS — M1250 Traumatic arthropathy, unspecified site: Secondary | ICD-10-CM | POA: Diagnosis not present

## 2022-05-12 DIAGNOSIS — G8929 Other chronic pain: Secondary | ICD-10-CM

## 2022-05-12 DIAGNOSIS — M19071 Primary osteoarthritis, right ankle and foot: Secondary | ICD-10-CM

## 2022-05-12 NOTE — Progress Notes (Unsigned)
Subjective:  Patient ID: Diana Oneill, female    DOB: 12/25/1955,  MRN: [id number]  Chief Complaint  Patient presents with   Foot Pain    In November patient fell and hurt right ankle , it is still having some swelling    Toe Pain    Pt states on Christmas day she hit her 3rd toe on right foot on the corner of her bed and the toe is still bruised and painful     67 y.o. female presents with right ankle pain. This pain is chronic in nature. Has been hurting for many years. History of right ankle fracture. Has surgical hardware intact. Had multiple prior steroid injections about the right ankle with no improvement. Pain with motion of the ankle and when ambulating. Nothing seems to make it better.  Also has some 3rd toe pain and edema after an injury.  Past Medical History:  Diagnosis Date   Acidosis 10/25/2016   Acute encephalopathy 10/25/2016   Acute kidney injury [facility] [address])    Acute respiratory failure ([place]) 10/25/2016   Anemia    Ankle pain 07/10/2017   Anxiety    no meds   Arthritis    Asthma    Bipolar disorder, now depressed ([place]) 08/10/2014   not on meds at this time   Cancer [facility])    skin cancer, uterine cancer   CKD (chronic kidney disease), stage III ([place])    Closed displaced trimalleolar fracture of right ankle 01/05/2017   Cocaine use disorder, mild, in early remission ([place]) 08/10/2014   COPD (chronic obstructive pulmonary disease) ([place])    Depression    Dyspnea    with exertion   Essential hypertension 10/25/2016   takes meds as needed   History of kidney stones    Hypertension    Hyponatremia    Nausea without vomiting 10/25/2016   Obese    Obesity 10/25/2016   Osteoarthritis    Osteoarthritis of left knee 10/23/2016   Sleep apnea    does not use CPAP "Makes too much Noise"   Tachycardia 10/25/2016   Thoracic aortic aneurysm ([place]) 09/2016   4.1 cm ascending thoracic aortic aneurysm.   Thrombocytopenia ([place]) 09/2016    Allergies  Allergen Reactions    Penicillins Anaphylaxis and Other (See Comments)    Has patient had a PCN reaction causing immediate rash, facial/tongue/throat swelling, SOB or lightheadedness with hypotension: Yes Has patient had a PCN reaction causing severe rash involving mucus membranes or skin necrosis: No Has patient had a PCN reaction that required hospitalization: No Has patient had a PCN reaction occurring within the last 10 years: No If all of the above answers are "NO", then may proceed with Cephalosporin use.   Prozac [Fluoxetine Hcl] Hives, Swelling and Other (See Comments)    Facial swelling    ROS: Negative except as per HPI above  Objective:  General: AAO x3, NAD  Dermatological: No open wounds, pes planus foot deformity. Varicosities of the right lower extremity.   Vascular:  Dorsalis Pedis artery and Posterior Tibial artery pedal pulses are 2/4 bilateral.  Capillary fill time < 3 sec to all digits.   Neruologic: Grossly intact via light touch bilateral. Protective threshold intact to all sites bilateral.   Musculoskeletal: Significant pain with palpation medial and lateral ankle. Edema 2+ non pitting right ankle. Limited ankle range of motion with pain, PF only to 10- 20 degrees and DF to 10 degrees.   Gait: Antalgic.    No images are  attached to the encounter.  Radiographs:  Date: 1/12/4 XR the right ankle and foot Weightbearing AP/Lateral/Oblique   Findings: Right ankle with severe degenerative changes noted at the tibiotalar joint including narrowing of the tibiotalar space, irrgularity osseous spurring and sclerosis. Hardware present including screws and plates on fibula as well as 2 screws present medial malleolus.  Assessment:   1. Arthritis of ankle, right   2. Traumatic arthritis   3. Chronic pain of right ankle   4. Presence of retained hardware   5. Injury of toe on right foot, initial encounter      Plan:  Patient was evaluated and treated and all questions answered.  #Severe  post traumatic arthritis right ankle with chronic pain and edema. Retained ankle hardware causing pain.  -Discussed conservative vs surgical treament options for right ankle hardware and arthritic pain - Conservatively could try bracing injections and antiinflammatory meds but do not feel this will be a long term solution as has failed prior conservative therapies including injections of steroid - Surgically would recommend ankle fusion with hardware removal from the right ankle. Dicussed risks benefits alternatives and complications including risk of non healing of bone or wound, need for further surgery, and possibility of limb loss.  Discussed need for prolonged non weightbearing after surgery 6-8 weeks. - patient wishes to proceed with surgery given prolonged chronic pain limiting her daily activities. She understands risks and wishes to proceed. Informed surgical consent obtained.  - E rx for Vitamin D and HbA1c provided for pre op testing. As of note writing Vitamin D 42 and Hba1c 5.5, both safe to proceed with planned procedure.  - Will begin surgical planning, pt to follow up after [place], [place] / [facility]

## 2022-05-13 LAB — HEMOGLOBIN A1C
Est. average glucose Bld gHb Est-mCnc: 111 mg/dL
Hgb A1c MFr Bld: 5.5 % (ref 4.8–5.6)

## 2022-05-13 LAB — VITAMIN D 25 HYDROXY (VIT D DEFICIENCY, FRACTURES): Vit D, 25-Hydroxy: 41.9 ng/mL (ref 30.0–100.0)

## 2022-05-16 ENCOUNTER — Ambulatory Visit: Payer: Medicare Other | Admitting: Allergy

## 2022-05-24 ENCOUNTER — Ambulatory Visit (INDEPENDENT_AMBULATORY_CARE_PROVIDER_SITE_OTHER): Payer: 59

## 2022-05-24 DIAGNOSIS — J309 Allergic rhinitis, unspecified: Secondary | ICD-10-CM | POA: Diagnosis not present

## 2022-06-05 ENCOUNTER — Telehealth: Payer: Self-pay | Admitting: Urology

## 2022-06-05 NOTE — Telephone Encounter (Signed)
DOS - 06/28/22   REMOVAL FIXATION DEEP RIGHT --- 20680 ANKLE ARTHRODESIS RIGHT --- 27870  [facility] EFFECTIVE DATE - 05/01/22  PLAN DEDUCTIBLE -  $240.00 W/ $0.00 REMAINING OUT OF POCKET - $8,850.00 W/ $8,545.93 REMAINING COINSURANCE - 20% COPAY - $0.00   SPOKE WITH LISA B. WITH [employer] AND SHE STATED THAT FOR CPT CODES 68934 AND 06840 NO PRIOR AUTH IS REQUIRED.  REF # [id number]

## 2022-06-06 ENCOUNTER — Ambulatory Visit (INDEPENDENT_AMBULATORY_CARE_PROVIDER_SITE_OTHER): Payer: 59 | Admitting: Allergy

## 2022-06-06 ENCOUNTER — Encounter: Payer: Self-pay | Admitting: Allergy

## 2022-06-06 VITALS — BP 126/82 | HR 68 | Resp 16 | Ht 64.8 in | Wt 159.4 lb

## 2022-06-06 DIAGNOSIS — Z720 Tobacco use: Secondary | ICD-10-CM | POA: Diagnosis not present

## 2022-06-06 DIAGNOSIS — J4489 Other specified chronic obstructive pulmonary disease: Secondary | ICD-10-CM

## 2022-06-06 DIAGNOSIS — J3089 Other allergic rhinitis: Secondary | ICD-10-CM

## 2022-06-06 DIAGNOSIS — K9049 Malabsorption due to intolerance, not elsewhere classified: Secondary | ICD-10-CM

## 2022-06-06 DIAGNOSIS — H1013 Acute atopic conjunctivitis, bilateral: Secondary | ICD-10-CM

## 2022-06-06 DIAGNOSIS — J309 Allergic rhinitis, unspecified: Secondary | ICD-10-CM | POA: Diagnosis not present

## 2022-06-06 NOTE — Patient Instructions (Addendum)
Allergies   - continue avoidance measures for grass pollens, dust mite and cockroach.    - Continue Allegra daily or Ryvent twice a day   - continue Singulair daily   - perform nasal saline rinses to help clean/flush out the nose and sinus tract.  This also helps your nasal sprays work better if nose is cleaning. Rinse kit provided.    - for nasal drainage use Astelin/Azelastine 2 sprays each nostril twice a day as needed   - use Flonase 2 sprays each nostril daily for the next 1 to 2 weeks for nasal congestion control.  Use for 1-2 weeks at a time if having nasal congestion or sinus pressure.     - for itchy/watery eyes use Optivar 1 drop each eye twice a day as needed.   - continue allergen immunotherapy per schedule (at every 2 weeks now).  Have access to epinephrine device on injections days  COPD with asthma  - continue Trelegy 260mg 1 puff daily.    - after inhaler use Rinse/gargle mouth  - continue Singulair '10mg'$  daily at bedtime  - use Airsupra 2 puffs every 4 hours as needed.  You can use this inhaler similar to plain albuterol.     - albuterol inhaler 2 puffs every 4-6 hours as needed for cough/wheeze/shortness of breath/chest tightness.  May use 15-20 minutes prior to activity.   Monitor frequency of use.    Control goals:  Full participation in all desired activities (may need albuterol before activity) Albuterol use two time or less a week on average (not counting use with activity) Cough interfering with sleep two time or less a month Oral steroids no more than once a year No hospitalizations  Tobacco use  - continue your course to quitting!  Continue to decrease your daily cigarette use.    Food intolerance  - skin testing to milk has been negative thus confirming dairy intolerance  - continue avoidance of milk/ice cream to prevent GI symptoms  We will give you a call when letter is ready for pickup Follow-up 6 months or sooner if needed

## 2022-06-06 NOTE — Progress Notes (Signed)
Follow-up Note  RE: Diana Oneill MRN: [id number] DOB: 1955-08-12 Date of Office Visit: 06/06/2022   History of present illness: Diana Oneill is a 67 y.o. female presenting today for follow-up of allergic rhinitis, COPD with asthma overlap, which intolerance and tobacco use.  She was last seen in the office on 09/06/2021 by myself. She states she has had all of the covid vaccines as well as flu vaccine and RSV vaccine too.  She state sshe has not been very sick at all this winter.    She states she coughs all day but more in the morning.  She states she needs to quit smoking.  Her PCP put her on Chantix and states she only opens 1 pack a day now.  Before she would open up 2 packs.    She states she may not smoke the whole pack and may light one and not even smoke the cigarette.  She states she likes to have Something in her hands become such a habit.  She really wants to continue to decrease her smoking as on her most recent chest CT did show emphysematous changes.  She follows with pulmonary for COPD management.   She state she has a lot of stressors including family stressors and this adds to the need to smoke and also worsens her respiratory symptoms.   She is using trelegy 1 puff daily and taking singulair daily.  She is using albuterol at least once a day and on a bad day up to 3 times per day.   When she is outside especially on windy days she will notice more phlegm that then drains down the throat.  She states the astelin helps more than the flonase with his sinus control.  She states she can have drippy nose throughout the day.  She is on immunotherapy at red vial every 2 weeks.  She does feel like the allergy shots have been helpful.   Review of systems: Review of Systems  Constitutional: Negative.   HENT:         See HPI  Eyes: Negative.   Respiratory:         See HPI  Cardiovascular: Negative.   Gastrointestinal: Negative.   Musculoskeletal: Negative.    Skin: Negative.   Allergic/Immunologic: Negative.   Neurological: Negative.   Psychiatric/Behavioral:         Stressed     All other systems negative unless noted above in HPI  Past medical/social/surgical/family history have been reviewed and are unchanged unless specifically indicated below.  No changes  Medication List: Current Outpatient Medications  Medication Sig Dispense Refill   acetaminophen (TYLENOL) 325 MG tablet Take by mouth.     albuterol (VENTOLIN HFA) 108 (90 Base) MCG/ACT inhaler INHALE 2 PUFFS BY MOUTH EVERY 4 TO 6 HOURS AS NEEDED FOR COUGH OR WHEEZING OR SHORTNESS OF BREATH OR CHEST TIGHTNESS 8 g 2   allopurinol (ZYLOPRIM) 100 MG tablet Take 100 mg by mouth daily.     Ascorbic Acid (VITAMIN C PO) Take 1,000 mg by mouth daily.      azelastine (ASTELIN) 0.1 % nasal spray Place 2 sprays into both nostrils 2 (two) times daily as needed for rhinitis or allergies.      azelastine (OPTIVAR) 0.05 % ophthalmic solution Place 1 drop into both eyes 2 (two) times daily. 6 mL 5   B Complex Vitamins (VITAMIN B COMPLEX PO) Take 1 tablet by mouth daily.      busPIRone (BUSPAR)  5 MG tablet Take by mouth.     Carbinoxamine Maleate (RYVENT) 6 MG TABS Take by mouth.     Cholecalciferol (VITAMIN D3) 125 MCG (5000 UT) CAPS Take 5,000 Units by mouth daily.     colchicine 0.6 MG tablet Take 0.6 mg by mouth 2 (two) times daily as needed.     COMBIVENT RESPIMAT 20-100 MCG/ACT AERS respimat Inhale 1 puff into the lungs every 6 (six) hours as needed for wheezing or shortness of breath.      cyanocobalamin 100 MCG tablet Take by mouth.     diclofenac Sodium (VOLTAREN) 1 % GEL diclofenac 1 % topical gel  APPLY STRIP TOPICALLY TO THE AFFECTED AREA TWICE DAILY AS NEEDED FOR PAIN     EPINEPHrine 0.3 mg/0.3 mL IJ SOAJ injection Use as directed for life threatening allergic reactions only 2 each 3   ferrous sulfate 325 (65 FE) MG EC tablet Take 325 mg by mouth 3 (three) times daily with meals.      fluticasone (FLONASE) 50 MCG/ACT nasal spray Place 2 sprays into both nostrils 2 (two) times daily as needed for allergies. 16 g 5   fluticasone (FLOVENT HFA) 110 MCG/ACT inhaler INHALE 2 PUFF INTO THE LUNGS TWICE DAILY 1 each 5   furosemide (LASIX) 40 MG tablet furosemide 40 mg tablet  TAKE 1 TABLET BY MOUTH EVERY MORNING AND ADDITIONAL 1 TABLET IN THE AFTERNOON AS NEEDED FOR SWELLING     gabapentin (NEURONTIN) 300 MG capsule Take by mouth 2 (two) times daily.     ketoconazole (NIZORAL) 2 % shampoo Apply 1 application topically 2 (two) times a week.      lidocaine (LIDODERM) 5 % lidocaine 5 % topical patch     lisinopril (ZESTRIL) 10 MG tablet Take 1 tablet by mouth daily.     montelukast (SINGULAIR) 10 MG tablet Take 1 tablet by mouth daily.     Multiple Vitamins-Minerals (ZINC PO) Take by mouth.     NARCAN 4 MG/0.1ML LIQD nasal spray kit USE 1 SPRAY IN NOSE AS DIRECTED     nystatin (MYCOSTATIN) 100000 UNIT/ML suspension Take by mouth.     omeprazole (PRILOSEC) 40 MG capsule Take 40 mg by mouth daily.     RYVENT 6 MG TABS TAKE 1 TABLET BY MOUTH TWICE DAILY 60 tablet 5   tiZANidine (ZANAFLEX) 4 MG tablet Take 4 mg by mouth at bedtime.      TRELEGY ELLIPTA 200-62.5-25 MCG/ACT AEPB INHALE 1 PUFF INTO THE LUNGS DAILY 60 each 3   triamcinolone cream (KENALOG) 0.5 % Apply topically 2 (two) times daily.     Current Facility-Administered Medications  Medication Dose Route Frequency Provider Last Rate Last Admin   triamcinolone acetonide (KENALOG) 10 MG/ML injection 10 mg  10 mg Other Once Landis Martins, DPM       triamcinolone acetonide (KENALOG) 10 MG/ML injection 10 mg  10 mg Other Once Landis Martins, DPM         Known medication allergies: Allergies  Allergen Reactions   Penicillins Anaphylaxis and Other (See Comments)    Has patient had a PCN reaction causing immediate rash, facial/tongue/throat swelling, SOB or lightheadedness with hypotension: Yes Has patient had a PCN reaction  causing severe rash involving mucus membranes or skin necrosis: No Has patient had a PCN reaction that required hospitalization: No Has patient had a PCN reaction occurring within the last 10 years: No If all of the above answers are "NO", then may proceed with Cephalosporin use.  Prozac [Fluoxetine Hcl] Hives, Swelling and Other (See Comments)    Facial swelling     Physical examination: Blood pressure 126/82, pulse 68, resp. rate 16, height 5' 4.8" (1.646 m), weight 159 lb 6.4 oz (72.3 kg), SpO2 95 %.  General: Alert, interactive, in no acute distress. HEENT: PERRLA, TMs pearly gray, turbinates mildly edematous with clear discharge, post-pharynx non erythematous. Neck: Supple without lymphadenopathy. Lungs: Clear to auscultation without wheezing, rhonchi or rales. {no increased work of breathing. CV: Normal S1, S2 without murmurs. Abdomen: Nondistended, nontender. Skin: Warm and dry, without lesions or rashes. Extremities:  No clubbing, cyanosis or edema. Neuro:   Grossly intact.  Diagnositics/Labs: None today   Assessment and plan: Allergic rhinitis with conjunctivitis   - continue avoidance measures for grass pollens, dust mite and cockroach.    - Continue Allegra daily or Ryvent twice a day   - continue Singulair daily   - perform nasal saline rinses to help clean/flush out the nose and sinus tract.  This also helps your nasal sprays work better if nose is cleaning. Rinse kit provided.    - for nasal drainage use Astelin/Azelastine 2 sprays each nostril twice a day as needed   - use Flonase 2 sprays each nostril daily for the next 1 to 2 weeks for nasal congestion control.  Use for 1-2 weeks at a time if having nasal congestion or sinus pressure.     - for itchy/watery eyes use Optivar 1 drop each eye twice a day as needed.   - continue allergen immunotherapy per schedule (at every 2 weeks now).  Have access to epinephrine device on injections days  COPD with asthma  -  continue Trelegy 235mg 1 puff daily.    - after inhaler use Rinse/gargle mouth  - continue Singulair '10mg'$  daily at bedtime  - use Airsupra 2 puffs every 4 hours as needed.  You can use this inhaler similar to plain albuterol.     - albuterol inhaler 2 puffs every 4-6 hours as needed for cough/wheeze/shortness of breath/chest tightness.  May use 15-20 minutes prior to activity.   Monitor frequency of use.    Control goals:  Full participation in all desired activities (may need albuterol before activity) Albuterol use two time or less a week on average (not counting use with activity) Cough interfering with sleep two time or less a month Oral steroids no more than once a year No hospitalizations  Tobacco use  - continue your course to quitting!  Continue to decrease your daily cigarette use.    Food intolerance  - skin testing to milk has been negative thus confirming dairy intolerance  - continue avoidance of milk/ice cream to prevent GI symptoms   Follow-up 6 months or sooner if needed  I appreciate the opportunity to take part in [facility]. Please do not hesitate to contact me with questions.  Sincerely,   SPrudy Feeler MD Allergy/Immunology [facility]

## 2022-06-07 ENCOUNTER — Telehealth: Payer: Self-pay

## 2022-06-07 NOTE — Telephone Encounter (Signed)
-----   Message from [place], MD sent at 06/07/2022  1:22 PM EST ----- Regarding: letter Please create into a letter --------------------------------  To whom it may concern:  Diana Oneill is a patient of mine at the [facility] for management of asthma with COPD, allergic rhinitis with conjunctivitis and food intolerance.  For her asthma with COPD management and overall health it is prudent that she quit smoking.  She expresses that a large part of the difficulty around smoking cessation are stressors in her life, of which she reports significant family stressors.  I have advised if she can decrease the amount of stressors in her life (which may include familial) this will improve her overall health and aide in continuing her smoking cessation which will help improve her asthma/COPD control and lung health.   Sincerely,      Prudy Feeler, MD [facility]

## 2022-06-07 NOTE — Telephone Encounter (Signed)
Letter created and placed on your desk for you to sign.

## 2022-06-12 ENCOUNTER — Telehealth: Payer: Self-pay

## 2022-06-12 NOTE — Telephone Encounter (Signed)
Diana Oneill returned my call from last week about her H&P. PCP stated they wanted her to get clearance from her pulmonologist. She stated she has not made an appointment with her pulmonologist yet but feels like she is getting pneumonia. She has not felt good for a few weeks. She is to schedule that appointment but also wanted to cancel her surgery for right now. She stated she doesn't have anyone to help her with being NWB for 6 to 8 weeks. She will call back to reschedule. Notified Dr. Loel Lofty,

## 2022-06-19 ENCOUNTER — Telehealth: Payer: Self-pay

## 2022-06-19 ENCOUNTER — Inpatient Hospital Stay: Admission: RE | Admit: 2022-06-19 | Payer: Medicare Other | Source: Ambulatory Visit

## 2022-06-19 MED ORDER — AIRSUPRA 90-80 MCG/ACT IN AERO: 2.0000 | INHALATION_SPRAY | RESPIRATORY_TRACT | 2 refills | Status: DC | PRN

## 2022-06-19 NOTE — Telephone Encounter (Signed)
Patient called in -DOB/Phamacy verified - stated Diana Oneill was never sent into pharmacy - requesting inhaler be sent in.  Patient advised Airsupra inhaler prescription would be sent [facility] [address] now.  Patient verbalized understanding, no further questions.

## 2022-06-21 ENCOUNTER — Ambulatory Visit (INDEPENDENT_AMBULATORY_CARE_PROVIDER_SITE_OTHER): Payer: 59 | Admitting: *Deleted

## 2022-06-21 ENCOUNTER — Telehealth: Payer: Self-pay

## 2022-06-21 DIAGNOSIS — J309 Allergic rhinitis, unspecified: Secondary | ICD-10-CM

## 2022-06-21 NOTE — Telephone Encounter (Signed)
PA has been DENIED due to:   Colin Ina is denied because it is not on your plan's Drug List (formulary). Medication authorization requires the following: (1) You need to try two (2) of these covered drugs: (a) Levalbuterol HFA. (b) Symbicort. (2) OR your doctor needs to give [place] specific medical reasons why two (2) of the covered drug(s) are not appropriate for you

## 2022-06-21 NOTE — Telephone Encounter (Signed)
PA request received via CMM for Airsupra 90-80MCG/ACT aerosol  PA has been submitted and is pending determination.  Key: [id number]

## 2022-06-27 DIAGNOSIS — J301 Allergic rhinitis due to pollen: Secondary | ICD-10-CM | POA: Diagnosis not present

## 2022-06-27 NOTE — Progress Notes (Signed)
VIALS EXP 06-28-23

## 2022-06-28 ENCOUNTER — Ambulatory Visit: Admit: 2022-06-28 | Payer: Medicare Other | Admitting: Podiatry

## 2022-06-28 DIAGNOSIS — J3089 Other allergic rhinitis: Secondary | ICD-10-CM | POA: Diagnosis not present

## 2022-06-28 SURGERY — REMOVAL, HARDWARE
Anesthesia: Choice | Site: Ankle | Laterality: Right

## 2022-07-04 ENCOUNTER — Encounter: Payer: 59 | Admitting: Podiatry

## 2022-07-04 ENCOUNTER — Ambulatory Visit (INDEPENDENT_AMBULATORY_CARE_PROVIDER_SITE_OTHER): Payer: 59

## 2022-07-04 DIAGNOSIS — J309 Allergic rhinitis, unspecified: Secondary | ICD-10-CM

## 2022-07-20 ENCOUNTER — Ambulatory Visit (INDEPENDENT_AMBULATORY_CARE_PROVIDER_SITE_OTHER): Payer: 59

## 2022-07-20 DIAGNOSIS — J309 Allergic rhinitis, unspecified: Secondary | ICD-10-CM

## 2022-08-07 ENCOUNTER — Ambulatory Visit (INDEPENDENT_AMBULATORY_CARE_PROVIDER_SITE_OTHER): Payer: 59 | Admitting: *Deleted

## 2022-08-07 DIAGNOSIS — J309 Allergic rhinitis, unspecified: Secondary | ICD-10-CM | POA: Diagnosis not present

## 2022-08-14 ENCOUNTER — Ambulatory Visit (INDEPENDENT_AMBULATORY_CARE_PROVIDER_SITE_OTHER): Payer: 59 | Admitting: *Deleted

## 2022-08-14 DIAGNOSIS — J309 Allergic rhinitis, unspecified: Secondary | ICD-10-CM

## 2022-08-21 ENCOUNTER — Encounter: Payer: Self-pay | Admitting: *Deleted

## 2022-08-21 NOTE — Progress Notes (Signed)
This encounter was created in error - please disregard.

## 2022-08-24 ENCOUNTER — Ambulatory Visit (INDEPENDENT_AMBULATORY_CARE_PROVIDER_SITE_OTHER): Payer: 59 | Admitting: *Deleted

## 2022-08-24 DIAGNOSIS — J309 Allergic rhinitis, unspecified: Secondary | ICD-10-CM

## 2022-08-28 ENCOUNTER — Ambulatory Visit (INDEPENDENT_AMBULATORY_CARE_PROVIDER_SITE_OTHER): Payer: 59 | Admitting: *Deleted

## 2022-08-28 DIAGNOSIS — J309 Allergic rhinitis, unspecified: Secondary | ICD-10-CM

## 2022-09-04 ENCOUNTER — Ambulatory Visit (INDEPENDENT_AMBULATORY_CARE_PROVIDER_SITE_OTHER): Payer: 59 | Admitting: *Deleted

## 2022-09-04 DIAGNOSIS — J309 Allergic rhinitis, unspecified: Secondary | ICD-10-CM

## 2022-09-19 ENCOUNTER — Ambulatory Visit (INDEPENDENT_AMBULATORY_CARE_PROVIDER_SITE_OTHER): Payer: 59

## 2022-09-19 DIAGNOSIS — J309 Allergic rhinitis, unspecified: Secondary | ICD-10-CM

## 2022-10-12 ENCOUNTER — Ambulatory Visit (INDEPENDENT_AMBULATORY_CARE_PROVIDER_SITE_OTHER): Payer: 59 | Admitting: *Deleted

## 2022-10-12 DIAGNOSIS — J309 Allergic rhinitis, unspecified: Secondary | ICD-10-CM

## 2022-10-25 ENCOUNTER — Ambulatory Visit (INDEPENDENT_AMBULATORY_CARE_PROVIDER_SITE_OTHER): Payer: 59

## 2022-10-25 DIAGNOSIS — J309 Allergic rhinitis, unspecified: Secondary | ICD-10-CM | POA: Diagnosis not present

## 2022-11-07 ENCOUNTER — Ambulatory Visit (INDEPENDENT_AMBULATORY_CARE_PROVIDER_SITE_OTHER): Payer: 59 | Admitting: *Deleted

## 2022-11-07 DIAGNOSIS — J309 Allergic rhinitis, unspecified: Secondary | ICD-10-CM | POA: Diagnosis not present

## 2022-11-20 ENCOUNTER — Ambulatory Visit (INDEPENDENT_AMBULATORY_CARE_PROVIDER_SITE_OTHER): Payer: Self-pay | Admitting: *Deleted

## 2022-11-20 DIAGNOSIS — J309 Allergic rhinitis, unspecified: Secondary | ICD-10-CM | POA: Diagnosis not present

## 2022-11-22 DIAGNOSIS — J301 Allergic rhinitis due to pollen: Secondary | ICD-10-CM

## 2022-11-22 NOTE — Progress Notes (Signed)
VIALS EXP 11-22-23

## 2022-11-23 DIAGNOSIS — J3089 Other allergic rhinitis: Secondary | ICD-10-CM

## 2022-12-05 ENCOUNTER — Ambulatory Visit (INDEPENDENT_AMBULATORY_CARE_PROVIDER_SITE_OTHER): Payer: 59 | Admitting: Allergy

## 2022-12-05 ENCOUNTER — Encounter: Payer: Self-pay | Admitting: Allergy

## 2022-12-05 VITALS — BP 124/74 | HR 55 | Temp 98.5°F | Resp 16

## 2022-12-05 DIAGNOSIS — J0110 Acute frontal sinusitis, unspecified: Secondary | ICD-10-CM | POA: Diagnosis not present

## 2022-12-05 DIAGNOSIS — J3089 Other allergic rhinitis: Secondary | ICD-10-CM | POA: Diagnosis not present

## 2022-12-05 DIAGNOSIS — J4489 Other specified chronic obstructive pulmonary disease: Secondary | ICD-10-CM | POA: Diagnosis not present

## 2022-12-05 DIAGNOSIS — H1013 Acute atopic conjunctivitis, bilateral: Secondary | ICD-10-CM | POA: Diagnosis not present

## 2022-12-05 DIAGNOSIS — K9049 Malabsorption due to intolerance, not elsewhere classified: Secondary | ICD-10-CM

## 2022-12-05 DIAGNOSIS — Z720 Tobacco use: Secondary | ICD-10-CM

## 2022-12-05 MED ORDER — DOXYCYCLINE HYCLATE 100 MG PO TABS: 100.0000 mg | ORAL_TABLET | Freq: Two times a day (BID) | ORAL | 0 refills | Status: AC

## 2022-12-05 MED ORDER — TRELEGY ELLIPTA 200-62.5-25 MCG/ACT IN AEPB: 1.0000 | INHALATION_SPRAY | Freq: Every day | RESPIRATORY_TRACT | 5 refills | Status: DC

## 2022-12-05 NOTE — Patient Instructions (Addendum)
Allergies   - continue avoidance measures for grass pollens, dust mite and cockroach.    - Continue Allegra daily or Ryvent twice a day   - continue Singulair daily   - perform nasal saline rinses to help clean/flush out the nose and sinus tract.  This also helps your nasal sprays work better if nose is cleaning. Rinse kit provided.    - for nasal drainage use Astelin/Azelastine 2 sprays each nostril twice a day as needed   - use Flonase 2 sprays each nostril daily for the next 1 to 2 weeks for nasal congestion control.  Use for 1-2 weeks at a time if having nasal congestion or sinus pressure.     - for itchy/watery eyes use Optivar 1 drop each eye twice a day as needed.   - continue allergen immunotherapy per schedule.  Have access to epinephrine device on injections days   - will update allergy panel via blood work to determine if you have developed a mold allergy since last tested in 2021  COPD with asthma  - continue Trelegy 1 puff daily.    - after inhaler use Rinse/gargle mouth  - continue Singulair 10mg  daily at bedtime  - use Albuterol OR Airsupra 2 puffs every 4 hours as needed for cough/wheeze/shortness of breath/chest tightness.    Control goals:  Full participation in all desired activities (may need albuterol before activity) Albuterol use two time or less a week on average (not counting use with activity) Cough interfering with sleep two time or less a month Oral steroids no more than once a year No hospitalizations  Tobacco use  - continue your course to quitting!  Continue to decrease your daily cigarette use.   You are doing great with this.  Keep up the good work.   Food intolerance  - skin testing to milk has been negative thus confirming dairy intolerance  - continue avoidance of milk/ice cream to prevent GI symptoms  Sinus infection - take Doxycyline 1 tab twice a day for next 10 days  Follow-up 6 months or sooner if needed

## 2022-12-05 NOTE — Progress Notes (Signed)
Follow-up Note  RE: Diana Oneill MRN: [id number] DOB: 06/25/1955 Date of Office Visit: 12/05/2022   History of present illness: Diana Oneill is a 67 y.o. female presenting today for follow-up of COPD with asthma, allergic conjunctivitis, food intolerance and tobacco use.  She was last seen in office on 06/06/2022 by myself.  She has not had any minges, surgeries or hospitalizations at this visit. She states over the past 2 weeks she has had increasing sinus headache in the forehead area and around her eye as well as clogged ears.  She feels like she have a sinus infection.  She has noted more nasal drainage as well.  Denies fever at this time.  She has not tried any medications to help with the symptoms other than her typical allergy regimen which includes Singulair daily, antihistamine like Allegra, nasal spray Astelin and Flonase however she states she is out of the Astelin.  She states she is having a constant runny nose.   With her COPD and asthma overlap she has not had any urgent care or ED visits.  She states she has used albuterol a few times a week on average.  She is on Trelegy 200 mcg taking 1 puff daily. She states she is now on Chantix and has cut back from 2 packs of cigarettes a day down to half a pack per day. She does continue to avoid dairy in the diet.  Review of systems: 10pt ROS negative listed above in HPI  Past medical/social/surgical/family history have been reviewed and are unchanged unless specifically indicated below.  No changes  Medication List: Current Outpatient Medications  Medication Sig Dispense Refill   acetaminophen (TYLENOL) 325 MG tablet Take by mouth.     albuterol (VENTOLIN HFA) 108 (90 Base) MCG/ACT inhaler INHALE 2 PUFFS BY MOUTH EVERY 4 TO 6 HOURS AS NEEDED FOR COUGH OR WHEEZING OR SHORTNESS OF BREATH OR CHEST TIGHTNESS 8 g 2   allopurinol (ZYLOPRIM) 100 MG tablet Take 100 mg by mouth daily.     Ascorbic Acid (VITAMIN C PO)  Take 1,000 mg by mouth daily.      azelastine (ASTELIN) 0.1 % nasal spray Place 2 sprays into both nostrils 2 (two) times daily as needed for rhinitis or allergies.      B Complex Vitamins (VITAMIN B COMPLEX PO) Take 1 tablet by mouth daily.      Carbinoxamine Maleate (RYVENT) 6 MG TABS Take by mouth.     Cholecalciferol (VITAMIN D3) 125 MCG (5000 UT) CAPS Take 5,000 Units by mouth daily.     clonazePAM (KLONOPIN) 0.5 MG tablet Take by mouth.     colchicine 0.6 MG tablet Take 0.6 mg by mouth 2 (two) times daily as needed.     cyanocobalamin 100 MCG tablet Take by mouth.     diclofenac Sodium (VOLTAREN) 1 % GEL diclofenac 1 % topical gel  APPLY STRIP TOPICALLY TO THE AFFECTED AREA TWICE DAILY AS NEEDED FOR PAIN     doxycycline (VIBRA-TABS) 100 MG tablet Take 1 tablet (100 mg total) by mouth 2 (two) times daily for 10 days. 20 tablet 0   EPINEPHrine 0.3 mg/0.3 mL IJ SOAJ injection Use as directed for life threatening allergic reactions only 2 each 3   ferrous sulfate 325 (65 FE) MG EC tablet Take 325 mg by mouth 3 (three) times daily with meals.     fluticasone (FLONASE) 50 MCG/ACT nasal spray Place 2 sprays into both nostrils 2 (two) times daily  as needed for allergies. 16 g 5   furosemide (LASIX) 40 MG tablet furosemide 40 mg tablet  TAKE 1 TABLET BY MOUTH EVERY MORNING AND ADDITIONAL 1 TABLET IN THE AFTERNOON AS NEEDED FOR SWELLING     gabapentin (NEURONTIN) 300 MG capsule Take by mouth 2 (two) times daily.     ketoconazole (NIZORAL) 2 % shampoo Apply 1 application topically 2 (two) times a week.      lidocaine (LIDODERM) 5 % lidocaine 5 % topical patch     lisinopril (ZESTRIL) 10 MG tablet Take 1 tablet by mouth daily as needed.     montelukast (SINGULAIR) 10 MG tablet Take 1 tablet by mouth daily.     Multiple Vitamins-Minerals (ZINC PO) Take by mouth.     NARCAN 4 MG/0.1ML LIQD nasal spray kit USE 1 SPRAY IN NOSE AS DIRECTED     omeprazole (PRILOSEC) 40 MG capsule Take 40 mg by mouth  daily.     oxyCODONE-acetaminophen (PERCOCET) 10-325 MG tablet TAKE 1 TABLET BY MOUTH 6 TIMES A DAY     QUEtiapine (SEROQUEL) 50 MG tablet Take by mouth.     RYVENT 6 MG TABS TAKE 1 TABLET BY MOUTH TWICE DAILY 60 tablet 5   tiZANidine (ZANAFLEX) 4 MG tablet Take 4 mg by mouth at bedtime.      triamcinolone cream (KENALOG) 0.5 % Apply topically 2 (two) times daily.     varenicline (CHANTIX) 1 MG tablet Take 1 mg by mouth daily.     Albuterol-Budesonide (AIRSUPRA) 90-80 MCG/ACT AERO Inhale 2 puffs into the lungs every 4 (four) hours as needed. (Patient not taking: Reported on 12/05/2022) 10.7 g 2   TRELEGY ELLIPTA 200-62.5-25 MCG/ACT AEPB Inhale 1 puff into the lungs daily. 60 each 5   Current Facility-Administered Medications  Medication Dose Route Frequency Provider Last Rate Last Admin   triamcinolone acetonide (KENALOG) 10 MG/ML injection 10 mg  10 mg Other Once Asencion Islam, DPM       triamcinolone acetonide (KENALOG) 10 MG/ML injection 10 mg  10 mg Other Once Asencion Islam, DPM         Known medication allergies: Allergies  Allergen Reactions   Penicillins Anaphylaxis and Other (See Comments)    Has patient had a PCN reaction causing immediate rash, facial/tongue/throat swelling, SOB or lightheadedness with hypotension: Yes Has patient had a PCN reaction causing severe rash involving mucus membranes or skin necrosis: No Has patient had a PCN reaction that required hospitalization: No Has patient had a PCN reaction occurring within the last 10 years: No If all of the above answers are "NO", then may proceed with Cephalosporin use.   Prozac [Fluoxetine Hcl] Hives, Swelling and Other (See Comments)    Facial swelling   Diphenhydramine-Pe-Apap Other (See Comments)     Physical examination: Blood pressure 124/74, pulse (!) 55, temperature 98.5 F (36.9 C), temperature source Temporal, resp. rate 16, SpO2 98%.  General: Alert, interactive, in no acute distress. HEENT: PERRLA, TMs  pearly gray, turbinates mildly edematous with clear discharge, post-pharynx non erythematous. Neck: Supple without lymphadenopathy. Lungs: Clear to auscultation without wheezing, rhonchi or rales. {no increased work of breathing. CV: Normal S1, S2 without murmurs. Abdomen: Nondistended, nontender. Skin: Warm and dry, without lesions or rashes. Extremities:  No clubbing, cyanosis or edema. Neuro:   Grossly intact.  Diagnositics/Labs: None today  Assessment and plan: Allergic rhinitis with conjunctivitis   - continue avoidance measures for grass pollens, dust mite and cockroach.    - Continue  Allegra daily or Ryvent twice a day   - continue Singulair daily   - perform nasal saline rinses to help clean/flush out the nose and sinus tract.  This also helps your nasal sprays work better if nose is cleaning. Rinse kit provided.    - for nasal drainage use Astelin/Azelastine 2 sprays each nostril twice a day as needed   - use Flonase 2 sprays each nostril daily for the next 1 to 2 weeks for nasal congestion control.  Use for 1-2 weeks at a time if having nasal congestion or sinus pressure.     - for itchy/watery eyes use Optivar 1 drop each eye twice a day as needed.   - continue allergen immunotherapy per schedule.  Have access to epinephrine device on injections days   - will update allergy panel via blood work to determine if you have developed a mold allergy since last tested in 2021  COPD with asthma  - continue Trelegy 1 puff daily.    - after inhaler use Rinse/gargle mouth  - continue Singulair 10mg  daily at bedtime  - use Albuterol OR Airsupra 2 puffs every 4 hours as needed for cough/wheeze/shortness of breath/chest tightness.    Control goals:  Full participation in all desired activities (may need albuterol before activity) Albuterol use two time or less a week on average (not counting use with activity) Cough interfering with sleep two time or less a month Oral steroids  no more than once a year No hospitalizations  Tobacco use  - continue your course to quitting!  Continue to decrease your daily cigarette use.   You are doing great with this.  Keep up the good work.   Food intolerance  - skin testing to milk has been negative thus confirming dairy intolerance  - continue avoidance of milk/ice cream to prevent GI symptoms  Sinus infection - take Doxycyline 1 tab twice a day for next 10 days  Follow-up 6 months or sooner if needed  I appreciate the opportunity to take part in Adalia's care. Please do not hesitate to contact me with questions.  Sincerely,   Margo Aye, MD Allergy/Immunology [facility]

## 2022-12-19 ENCOUNTER — Ambulatory Visit (INDEPENDENT_AMBULATORY_CARE_PROVIDER_SITE_OTHER): Payer: Self-pay | Admitting: *Deleted

## 2022-12-19 DIAGNOSIS — J309 Allergic rhinitis, unspecified: Secondary | ICD-10-CM

## 2023-01-09 ENCOUNTER — Telehealth: Payer: Self-pay

## 2023-01-09 ENCOUNTER — Ambulatory Visit (INDEPENDENT_AMBULATORY_CARE_PROVIDER_SITE_OTHER): Payer: 59

## 2023-01-09 DIAGNOSIS — J309 Allergic rhinitis, unspecified: Secondary | ICD-10-CM

## 2023-01-09 NOTE — Telephone Encounter (Signed)
*  Asthma/Allergy  Pharmacy Patient Advocate Encounter   Received notification from CoverMyMeds that prior authorization for Airsupra 90-80MCG/ACT aerosol  is required/requested.   Insurance verification completed.   The patient is insured through [facility] .   Per test claim:  ALBUTEROL HFA (PROAIR) OR ALBUTEROL HFA (PROVENTIL) OR VENTOLIN HFA, LEVALBUTEROL TARTRATE HFA, SYMBICORT is preferred by the insurance.  If suggested medication is appropriate, Please send in a new RX and discontinue this one. If not, please advise as to why it's not appropriate so that we may request a Prior Authorization.   Key: [id number]

## 2023-01-23 ENCOUNTER — Ambulatory Visit (INDEPENDENT_AMBULATORY_CARE_PROVIDER_SITE_OTHER): Payer: 59

## 2023-01-23 DIAGNOSIS — J309 Allergic rhinitis, unspecified: Secondary | ICD-10-CM | POA: Diagnosis not present

## 2023-02-05 ENCOUNTER — Ambulatory Visit (INDEPENDENT_AMBULATORY_CARE_PROVIDER_SITE_OTHER): Payer: 59

## 2023-02-05 DIAGNOSIS — J309 Allergic rhinitis, unspecified: Secondary | ICD-10-CM

## 2023-02-21 ENCOUNTER — Ambulatory Visit (INDEPENDENT_AMBULATORY_CARE_PROVIDER_SITE_OTHER): Payer: 59

## 2023-02-21 DIAGNOSIS — J309 Allergic rhinitis, unspecified: Secondary | ICD-10-CM | POA: Diagnosis not present

## 2023-03-08 ENCOUNTER — Ambulatory Visit (INDEPENDENT_AMBULATORY_CARE_PROVIDER_SITE_OTHER): Payer: 59 | Admitting: *Deleted

## 2023-03-08 DIAGNOSIS — J309 Allergic rhinitis, unspecified: Secondary | ICD-10-CM

## 2023-04-05 ENCOUNTER — Ambulatory Visit (INDEPENDENT_AMBULATORY_CARE_PROVIDER_SITE_OTHER): Payer: 59 | Admitting: *Deleted

## 2023-04-05 DIAGNOSIS — J309 Allergic rhinitis, unspecified: Secondary | ICD-10-CM

## 2023-04-30 ENCOUNTER — Ambulatory Visit (INDEPENDENT_AMBULATORY_CARE_PROVIDER_SITE_OTHER): Payer: 59 | Admitting: *Deleted

## 2023-04-30 DIAGNOSIS — J309 Allergic rhinitis, unspecified: Secondary | ICD-10-CM | POA: Diagnosis not present

## 2023-06-05 ENCOUNTER — Encounter: Payer: Self-pay | Admitting: Allergy

## 2023-06-05 ENCOUNTER — Ambulatory Visit (INDEPENDENT_AMBULATORY_CARE_PROVIDER_SITE_OTHER): Payer: 59 | Admitting: Allergy

## 2023-06-05 VITALS — BP 126/82 | HR 71 | Resp 16 | Wt 158.6 lb

## 2023-06-05 DIAGNOSIS — K9049 Malabsorption due to intolerance, not elsewhere classified: Secondary | ICD-10-CM

## 2023-06-05 DIAGNOSIS — J4489 Other specified chronic obstructive pulmonary disease: Secondary | ICD-10-CM

## 2023-06-05 DIAGNOSIS — H9190 Unspecified hearing loss, unspecified ear: Secondary | ICD-10-CM

## 2023-06-05 DIAGNOSIS — J3089 Other allergic rhinitis: Secondary | ICD-10-CM | POA: Diagnosis not present

## 2023-06-05 DIAGNOSIS — H1013 Acute atopic conjunctivitis, bilateral: Secondary | ICD-10-CM

## 2023-06-05 DIAGNOSIS — Z720 Tobacco use: Secondary | ICD-10-CM

## 2023-06-05 MED ORDER — IPRATROPIUM BROMIDE 0.06 % NA SOLN
NASAL | 5 refills | Status: AC
Start: 2023-06-05 — End: ?

## 2023-06-05 MED ORDER — AIRSUPRA 90-80 MCG/ACT IN AERO: 2.0000 | INHALATION_SPRAY | RESPIRATORY_TRACT | 2 refills | Status: DC | PRN

## 2023-06-05 NOTE — Patient Instructions (Addendum)
 Allergies   - continue avoidance measures for grass pollens, dust mite and cockroach.    - Continue Allegra  daily or Ryvent  twice a day   - Continue Singulair  daily   - perform nasal saline rinses to help clean/flush out the nose and sinus tract.  This also helps your nasal sprays work better if nose is cleaning.   - start use of nasal spray Atrovent  2 sprays each nostril up to 3-4 times a day as needed for runny nose.  This is a spray that helps with vasomotor rhinitis (nasal drainage triggered not by environmental allergens)   - for nasal drainage use Astelin /Azelastine  2 sprays each nostril twice a day as needed   - use Flonase  2 sprays each nostril daily for the next 1 to 2 weeks for nasal congestion control.  Use for 1-2 weeks at a time if having nasal congestion or sinus pressure.     - for itchy/watery eyes use Optivar  1 drop each eye twice a day as needed.   - continue allergen immunotherapy for pollens per schedule.  Have access to epinephrine  device on injections days   - environmental allergy testing only shows continued grass pollen allergy  COPD with asthma  - continue Trelegy 200mcg 1 puff daily.    - after inhaler use Rinse/gargle mouth  - continue Singulair  10mg  daily at bedtime  - use Albuterol  OR Airsupra  2 puffs every 4 hours as needed for cough/wheeze/shortness of breath/chest tightness.    Control goals:  Full participation in all desired activities (may need albuterol  before activity) Albuterol  use two time or less a week on average (not counting use with activity) Cough interfering with sleep two time or less a month Oral steroids no more than once a year No hospitalizations  Tobacco use  - continue your course to quitting!  Continue to decrease your daily cigarette use.   You are doing great with this.  Keep up the good work.   Food intolerance  - skin testing to milk has been negative thus confirming dairy intolerance  - continue avoidance of milk/ice cream to  prevent GI symptoms   Follow-up 6 months or sooner if needed

## 2023-06-05 NOTE — Progress Notes (Signed)
 Follow-up Note  RE: Diana Oneill MRN: [id number] DOB: 1955/05/19 Date of Office Visit: 06/05/2023   History of present illness: Diana Oneill is a 68 y.o. female presenting today for follow-up of allergic rhinitis with conjunctivitis, COPD with asthma overlap, food intolerance and tobacco use history.  She was last seen in the office on 12/05/2022 by myself.  At that visit she was treated for a sinus infection with doxycycline . Discussed the use of AI scribe software for clinical note transcription with the patient, who gave verbal consent to proceed.  She was started on Dupixent injections since her last visit by her dermatologist for her skin condition, administered twice a month.  She does believe the dupixent has helped with her breathing control too. She states before Dupixent she used to experience shortness of breath when walking her dogs, which she doesn't really notice anymore. Nasal symptoms occur, particularly with exposure to [place] pollen [place]. Her nose runs when outside, especially with temperature change.  She has a history of asthma with COPD overlap and uses Trelegy daily for maintenance. She also has an albuterol  inhaler for emergencies but is unsure if she has any refills left. She is working on quitting smoking and has reduced her intake to half a pack per day.  She has been receiving allergy shots, and her environmental allergy testing still shows a positive result for grass pollen. Allergy shots have been effective in reducing other allergens such as cockroach and dust mites. Nasal symptoms occur when exposed to grass pollen, especially when her husband cuts the grass.  She has a history of an aortic aneurysm and was previously told she had beginning stages of emphysema. A recent lung screening showed no spots on her lungs and a very small aortic aneurysm.  She reports having all her vaccines up to date, including  the flu, Covid and RSV vaccines.   She mentions a family history of hearing loss, as her father is nearly deaf and has used hearing aids for thirty years. She has noticed some hearing loss herself and is concerned she may have what her dad has. She has not seen ENT/audiology yet.     Review of systems: 10pt ROS negative unless noted above in HPI   Past medical/social/surgical/family history have been reviewed and are unchanged unless specifically indicated below.  Medication List: Current Outpatient Medications  Medication Sig Dispense Refill   acetaminophen  (TYLENOL ) 325 MG tablet Take by mouth.     albuterol  (VENTOLIN  HFA) 108 (90 Base) MCG/ACT inhaler INHALE 2 PUFFS BY MOUTH EVERY 4 TO 6 HOURS AS NEEDED FOR COUGH OR WHEEZING OR SHORTNESS OF BREATH OR CHEST TIGHTNESS 8 g 2   allopurinol (ZYLOPRIM) 100 MG tablet Take 100 mg by mouth daily.     Ascorbic Acid  (VITAMIN C PO) Take 1,000 mg by mouth daily.      azelastine  (ASTELIN ) 0.1 % nasal spray Place 2 sprays into both nostrils 2 (two) times daily as needed for rhinitis or allergies.      B Complex Vitamins (VITAMIN B COMPLEX PO) Take 1 tablet by mouth daily.      Carbinoxamine  Maleate (RYVENT ) 6 MG TABS Take by mouth.     Cholecalciferol (VITAMIN D3) 125 MCG (5000 UT) CAPS Take 5,000 Units by mouth daily.     clonazePAM  (KLONOPIN ) 0.5 MG tablet Take by mouth.     colchicine 0.6 MG tablet Take 0.6 mg by mouth 2 (two) times daily  as needed.     cyanocobalamin 100 MCG tablet Take by mouth.     diclofenac Sodium (VOLTAREN) 1 % GEL diclofenac 1 % topical gel  APPLY STRIP TOPICALLY TO THE AFFECTED AREA TWICE DAILY AS NEEDED FOR PAIN     DUPIXENT 300 MG/2ML prefilled syringe Inject 300 mg into the skin.     EPINEPHrine  0.3 mg/0.3 mL IJ SOAJ injection Use as directed for life threatening allergic reactions only 2 each 3   ferrous sulfate  325 (65 FE) MG EC tablet Take 325 mg by mouth 3 (three) times daily with meals.     fluticasone  (FLONASE ) 50  MCG/ACT nasal spray Place 2 sprays into both nostrils 2 (two) times daily as needed for allergies. 16 g 5   furosemide  (LASIX ) 40 MG tablet furosemide  40 mg tablet  TAKE 1 TABLET BY MOUTH EVERY MORNING AND ADDITIONAL 1 TABLET IN THE AFTERNOON AS NEEDED FOR SWELLING     gabapentin  (NEURONTIN ) 300 MG capsule Take by mouth 2 (two) times daily.     ipratropium (ATROVENT ) 0.06 % nasal spray 2 sprays in each nostril 3-4 times daily as needed 15 mL 5   ketoconazole (NIZORAL) 2 % shampoo Apply 1 application topically 2 (two) times a week.      lidocaine  (LIDODERM ) 5 % lidocaine  5 % topical patch     lisinopril  (ZESTRIL ) 10 MG tablet Take 1 tablet by mouth daily as needed.     montelukast  (SINGULAIR ) 10 MG tablet Take 1 tablet by mouth daily.     Multiple Vitamins-Minerals (ZINC PO) Take by mouth.     NARCAN  4 MG/0.1ML LIQD nasal spray kit USE 1 SPRAY IN NOSE AS DIRECTED     omeprazole (PRILOSEC) 40 MG capsule Take 40 mg by mouth daily.     oxyCODONE -acetaminophen  (PERCOCET) 10-325 MG tablet TAKE 1 TABLET BY MOUTH 6 TIMES A DAY     QUEtiapine (SEROQUEL) 50 MG tablet Take by mouth.     RYVENT  6 MG TABS TAKE 1 TABLET BY MOUTH TWICE DAILY 60 tablet 5   tiZANidine (ZANAFLEX) 4 MG tablet Take 4 mg by mouth at bedtime.      TRELEGY ELLIPTA  200-62.5-25 MCG/ACT AEPB Inhale 1 puff into the lungs daily. 60 each 5   triamcinolone  cream (KENALOG ) 0.5 % Apply topically 2 (two) times daily.     varenicline (CHANTIX) 1 MG tablet Take 1 mg by mouth daily.     Albuterol -Budesonide (AIRSUPRA ) 90-80 MCG/ACT AERO Inhale 2 puffs into the lungs every 4 (four) hours as needed. 10.7 g 2   Current Facility-Administered Medications  Medication Dose Route Frequency Provider Last Rate Last Admin   triamcinolone  acetonide (KENALOG ) 10 MG/ML injection 10 mg  10 mg Other Once Stover, Titorya, DPM       triamcinolone  acetonide (KENALOG ) 10 MG/ML injection 10 mg  10 mg Other Once Stover, Titorya, DPM         Known medication  allergies: Allergies  Allergen Reactions   Penicillins Anaphylaxis and Other (See Comments)    Has patient had a PCN reaction causing immediate rash, facial/tongue/throat swelling, SOB or lightheadedness with hypotension: Yes Has patient had a PCN reaction causing severe rash involving mucus membranes or skin necrosis: No Has patient had a PCN reaction that required hospitalization: No Has patient had a PCN reaction occurring within the last 10 years: No If all of the above answers are NO, then may proceed with Cephalosporin use.   Prozac [Fluoxetine Hcl] Hives, Swelling and Other (See  Comments)    Facial swelling   Diphenhydramine -Pe-Apap Other (See Comments)     Physical examination: Blood pressure 126/82, pulse 71, resp. rate 16, weight 158 lb 9.6 oz (71.9 kg), SpO2 98%.  General: Alert, interactive, in no acute distress. HEENT: PERRLA, TMs pearly gray, turbinates minimally edematous without discharge, post-pharynx non erythematous. Neck: Supple without lymphadenopathy. Lungs: Clear to auscultation without wheezing, rhonchi or rales. {no increased work of breathing. CV: Normal S1, S2 without murmurs. Abdomen: Nondistended, nontender. Skin: Warm and dry, without lesions or rashes. Extremities:  No clubbing, cyanosis or edema. Neuro:   Grossly intact.  Diagnositics/Labs:  Component     Latest Ref Rng 12/13/2022  IgE (Immunoglobulin E), Serum     6 - 495 IU/mL 240   D Pteronyssinus IgE     Class 0 kU/L <0.10   D Farinae IgE     Class 0 kU/L <0.10   Cat Dander IgE     Class 0 kU/L <0.10   Dog Dander IgE     Class 0 kU/L <0.10   Bermuda Grass IgE     Class 0 kU/L <0.10   Timothy Grass IgE     Class I kU/L 0.53 !   Johnson Grass IgE     Class 0 kU/L <0.10   Cockroach, German IgE     Class 0 kU/L <0.10   Penicillium Chrysogen IgE     Class 0 kU/L <0.10   Cladosporium Herbarum IgE     Class 0 kU/L <0.10   Aspergillus Fumigatus IgE     Class 0 kU/L <0.10    Alternaria Alternata IgE     Class 0 kU/L <0.10   Maple/Box Elder IgE     Class 0 kU/L <0.10   Common Silver Valrie IgE     Class 0 kU/L <0.10   Cedar, [place] IgE     Class 0 kU/L <0.10   Oak, White IgE     Class 0 kU/L <0.10   Elm, American IgE     Class 0 kU/L <0.10   Cottonwood IgE     Class 0 kU/L <0.10   Pecan, Hickory IgE     Class 0 kU/L <0.10   White Mulberry IgE     Class 0 kU/L <0.10   Ragweed, Short IgE     Class 0 kU/L <0.10   Pigweed, Rough IgE     Class 0 kU/L <0.10   Sheep Sorrel IgE Qn     Class 0 kU/L <0.10   Mouse Urine IgE     Class 0 kU/L <0.10   Mucor Racemosus IgE     Class 0 kU/L <0.10   Candida Albicans IgE     Class 0/I kU/L 0.19 !   Setomelanomma Rostrat     Class 0 kU/L <0.10   [id number] Fusarium proliferatum     Class 0 kU/L <0.10   Stemphylium Herbarum IgE     Class 0 kU/L <0.10   Aureobasidi Pullulans IgE     Class 0 kU/L <0.10   Phoma Betae IgE     Class 0 kU/L <0.10   M014-[id number] Epicoccum purpur     Class 0 kU/L <0.10     CT LDCT lung cancer screening- 05/22/23-  1. Lung-RADS 2, benign appearance or behavior. Continue annual  screening with low-dose chest CT without contrast in 12 months.  2. Coronary artery calcifications.  3. Stable 4 cm ascending thoracic aortic aneurysm. Recommend annual  imaging followup by CTA  or MRA. This recommendation follows 2010  ACCF/AHA/AATS/ACR/ASA/SCA/SCAI/SIR/STS/SVM Guidelines for the  Diagnosis and Management of Patients with Thoracic Aortic Disease.  Circulation. 2010; 121: Z733-z630. Aortic aneurysm NOS (ICD10-I71.9)  4. Unchanged left lobe of thyroid  gland nodule measuring 2.1 mm.  Recommend thyroid  US  (ref: J Am Coll Radiol. 2015 Feb;12(2):  143-50).  5. Aortic Atherosclerosis (ICD10-I70.0) and Emphysema (ICD10-J43.9).   Assessment and plan:   Allergic rhinitis with conjunctivitis   - continue avoidance measures for grass pollens, dust mite and cockroach.    - Continue Allegra  daily or  Ryvent  twice a day   - Continue Singulair  daily   - perform nasal saline rinses to help clean/flush out the nose and sinus tract.  This also helps your nasal sprays work better if nose is cleaning.   - start use of nasal spray Atrovent  2 sprays each nostril up to 3-4 times a day as needed for runny nose.  This is a spray that helps with vasomotor rhinitis (nasal drainage triggered not by environmental allergens)   - for nasal drainage use Astelin /Azelastine  2 sprays each nostril twice a day as needed   - use Flonase  2 sprays each nostril daily for the next 1 to 2 weeks for nasal congestion control.  Use for 1-2 weeks at a time if having nasal congestion or sinus pressure.     - for itchy/watery eyes use Optivar  1 drop each eye twice a day as needed.   - continue allergen immunotherapy for pollens per schedule.  Have access to epinephrine  device on injections days   - environmental allergy testing only shows continued grass pollen allergy  COPD with asthma  - continue Trelegy 200mcg 1 puff daily.    - after inhaler use Rinse/gargle mouth  - continue Singulair  10mg  daily at bedtime  - use Albuterol  OR Airsupra  2 puffs every 4 hours as needed for cough/wheeze/shortness of breath/chest tightness.    Control goals:  Full participation in all desired activities (may need albuterol  before activity) Albuterol  use two time or less a week on average (not counting use with activity) Cough interfering with sleep two time or less a month Oral steroids no more than once a year No hospitalizations  Tobacco use  - continue your course to quitting!  Continue to decrease your daily cigarette use.   You are doing great with this.  Keep up the good work.   Food intolerance  - skin testing to milk has been negative thus confirming dairy intolerance  - continue avoidance of milk/ice cream to prevent GI symptoms  Hearing loss  - will place referral to ENT for evaluation  Follow-up 6 months or sooner if  needed   I appreciate the opportunity to take part in Alexsa's care. Please do not hesitate to contact me with questions.  Sincerely,   Danita Brain, MD Allergy/Immunology [facility]

## 2023-06-06 MED ORDER — FEXOFENADINE HCL 180 MG PO TABS: 180.0000 mg | ORAL_TABLET | Freq: Every day | ORAL | 1 refills | Status: DC

## 2023-06-06 NOTE — Addendum Note (Signed)
 Addended by: Davy Faught on: 06/06/2023 05:12 PM   Modules accepted: Orders

## 2023-06-11 ENCOUNTER — Telehealth: Payer: Self-pay

## 2023-06-11 NOTE — Telephone Encounter (Signed)
 Referral to ENT has been faxed over to:   Julieann Ode, DO, MS [facility]  [address] [place], [place] [address] Phone: [phone] Fax: [phone]  Patient was informed

## 2023-06-21 ENCOUNTER — Other Ambulatory Visit: Payer: Self-pay | Admitting: Allergy

## 2023-06-25 DIAGNOSIS — J301 Allergic rhinitis due to pollen: Secondary | ICD-10-CM | POA: Diagnosis not present

## 2023-06-25 NOTE — Progress Notes (Signed)
 VIAL ONE EXP 06-25-23. EXP 06-24-24

## 2023-06-26 DIAGNOSIS — J3089 Other allergic rhinitis: Secondary | ICD-10-CM | POA: Diagnosis not present

## 2023-06-26 NOTE — Progress Notes (Signed)
 VIAL 2 MADE 06-26-23. EXP 06-25-24

## 2023-07-17 ENCOUNTER — Ambulatory Visit (INDEPENDENT_AMBULATORY_CARE_PROVIDER_SITE_OTHER)

## 2023-07-17 DIAGNOSIS — J309 Allergic rhinitis, unspecified: Secondary | ICD-10-CM

## 2023-08-13 ENCOUNTER — Ambulatory Visit (INDEPENDENT_AMBULATORY_CARE_PROVIDER_SITE_OTHER): Admitting: *Deleted

## 2023-08-13 DIAGNOSIS — J309 Allergic rhinitis, unspecified: Secondary | ICD-10-CM

## 2023-09-12 ENCOUNTER — Ambulatory Visit (INDEPENDENT_AMBULATORY_CARE_PROVIDER_SITE_OTHER)

## 2023-09-12 DIAGNOSIS — J309 Allergic rhinitis, unspecified: Secondary | ICD-10-CM | POA: Diagnosis not present

## 2023-10-30 ENCOUNTER — Ambulatory Visit (INDEPENDENT_AMBULATORY_CARE_PROVIDER_SITE_OTHER): Admitting: *Deleted

## 2023-10-30 DIAGNOSIS — J309 Allergic rhinitis, unspecified: Secondary | ICD-10-CM | POA: Diagnosis not present

## 2023-11-07 ENCOUNTER — Ambulatory Visit (INDEPENDENT_AMBULATORY_CARE_PROVIDER_SITE_OTHER)

## 2023-11-07 DIAGNOSIS — J309 Allergic rhinitis, unspecified: Secondary | ICD-10-CM | POA: Diagnosis not present

## 2023-11-07 MED ORDER — EPINEPHRINE 0.3 MG/0.3ML IJ SOAJ: INTRAMUSCULAR | 3 refills | Status: AC

## 2023-11-13 ENCOUNTER — Ambulatory Visit (INDEPENDENT_AMBULATORY_CARE_PROVIDER_SITE_OTHER): Admitting: *Deleted

## 2023-11-13 DIAGNOSIS — J309 Allergic rhinitis, unspecified: Secondary | ICD-10-CM

## 2023-12-04 ENCOUNTER — Ambulatory Visit: Payer: 59 | Admitting: Allergy

## 2023-12-11 ENCOUNTER — Ambulatory Visit: Admitting: Allergy

## 2023-12-18 ENCOUNTER — Ambulatory Visit (INDEPENDENT_AMBULATORY_CARE_PROVIDER_SITE_OTHER): Admitting: Allergy

## 2023-12-18 ENCOUNTER — Encounter: Payer: Self-pay | Admitting: Allergy

## 2023-12-18 VITALS — BP 112/66 | HR 62 | Resp 16

## 2023-12-18 DIAGNOSIS — H1013 Acute atopic conjunctivitis, bilateral: Secondary | ICD-10-CM | POA: Diagnosis not present

## 2023-12-18 DIAGNOSIS — J3089 Other allergic rhinitis: Secondary | ICD-10-CM

## 2023-12-18 DIAGNOSIS — K9049 Malabsorption due to intolerance, not elsewhere classified: Secondary | ICD-10-CM

## 2023-12-18 DIAGNOSIS — H9190 Unspecified hearing loss, unspecified ear: Secondary | ICD-10-CM

## 2023-12-18 DIAGNOSIS — J4489 Other specified chronic obstructive pulmonary disease: Secondary | ICD-10-CM

## 2023-12-18 DIAGNOSIS — Z720 Tobacco use: Secondary | ICD-10-CM

## 2023-12-18 MED ORDER — AZELASTINE HCL 0.05 % OP SOLN: 1.0000 [drp] | Freq: Two times a day (BID) | OPHTHALMIC | 5 refills | Status: AC

## 2023-12-18 MED ORDER — AIRSUPRA 90-80 MCG/ACT IN AERO: 2.0000 | INHALATION_SPRAY | RESPIRATORY_TRACT | 2 refills | Status: AC | PRN

## 2023-12-18 MED ORDER — FLUTICASONE PROPIONATE 50 MCG/ACT NA SUSP: 2.0000 | Freq: Two times a day (BID) | NASAL | 5 refills | Status: DC | PRN

## 2023-12-18 MED ORDER — CARBINOXAMINE MALEATE 6 MG PO TABS: 1.0000 | ORAL_TABLET | Freq: Two times a day (BID) | ORAL | 5 refills | Status: AC

## 2023-12-18 MED ORDER — AZELASTINE HCL 0.1 % NA SOLN: 2.0000 | Freq: Two times a day (BID) | NASAL | 5 refills | Status: AC | PRN

## 2023-12-18 MED ORDER — MONTELUKAST SODIUM 10 MG PO TABS: 10.0000 mg | ORAL_TABLET | Freq: Every day | ORAL | 5 refills | Status: AC

## 2023-12-18 NOTE — Patient Instructions (Addendum)
 Allergies   - continue avoidance measures for grass pollens, dust mite and cockroach.    - Continue Ryvent  twice a day   - Continue Singulair  daily   - perform nasal saline rinses to help clean/flush out the nose and sinus tract.  This also helps your nasal sprays work better if nose is cleaning.   - for nasal drainage use Astelin /Azelastine  2 sprays each nostril twice a day as needed   - use Flonase  2 sprays each nostril daily for the next 1 to 2 weeks for nasal congestion control.  Use for 1-2 weeks at a time if having nasal congestion or sinus pressure.     - for itchy/watery eyes use Optivar  1 drop each eye twice a day as needed.   - continue allergen immunotherapy for pollens per schedule.  Have access to epinephrine  device on injections days   - environmental allergy testing only shows continued grass pollen allergy  COPD with asthma  - under management with pulmonology at [facility]  - after inhaler use Rinse/gargle mouth  - continue Singulair  10mg  daily at bedtime  - use Airsupra  2 puffs every 4 hours as needed for cough/wheeze/shortness of breath/chest tightness.    Control goals:  Full participation in all desired activities (may need albuterol  before activity) Albuterol  use two time or less a week on average (not counting use with activity) Cough interfering with sleep two time or less a month Oral steroids no more than once a year No hospitalizations  Tobacco use  - continue your course to quitting!  Now using Chantix.  Continue to decrease your daily cigarette use.   You are doing great with this.  Keep up the good work.   Food intolerance  - skin testing to milk has been negative thus confirming dairy intolerance  - continue avoidance of milk/ice cream to prevent GI symptoms   Follow-up 6 months or sooner if needed

## 2023-12-18 NOTE — Progress Notes (Signed)
 Follow-up Note  RE: Diana Oneill MRN: [id number] DOB: March 08, 1956 Date of Office Visit: 12/18/2023   History of present illness: Diana Oneill is a 68 y.o. female presenting today for follow-up of allergic rhinitis with conjunctivitis, COPD with asthma overlap, food intolerance, tobacco use, hearing loss.  She was last seen in the office on 06/05/2023 by myself. Discussed the use of AI scribe software for clinical note transcription with the patient, who gave verbal consent to proceed.  She does follow with pulmonology at [facility] for her COPD.  She was on Trelegy but her most recent visit with pulmonology they recommend that she can hold Trelegy in continue use of Airsupra  which she states she has been doing.  She does continues to take Singulair  (montelukast ), which she finds helpful.she states her breathing has been pretty good and she has not had any significant respiratory symptoms since her last visit.  She did have a lung cancer screening CT done with pulmonology that did show coronary artery calcifications and ascending thoracic aortic aneurysm that was stable.  She states she is now going to be seeing a cardiologist regarding this.  She experiences headaches lasting two to three days. Ryvent  provides some relief. For severe headaches, she takes Holland, prescribed by her pain management doctor, and finds relief by resting in a quiet, dark room.  She uses two types of nasal sprays (Flonase  and Astelin ) for sinus issues but did not receive a third one (Atrovent ) due to insurance coverage issues. Hot showers and steam help alleviate her symptoms. She uses over-the-counter Visine for her eyes as she did not receive the prescribed Optivar  eye drops.  She is currently receiving allergy shots and if at monthly maintenance dosing can tell when it is time for her next dose. She has been on allergy shots since 2022.  She tolerates the shots well without large local or  systemic reactions.  She has access to an epinephrine  device.  She is actively trying to quit smoking and has resumed using Chantix, which makes her feel sick when she smokes so she feels like it is doing something positive. She has a history of successfully quitting alcohol  with divine help and is hopeful for similar success with smoking.  She recalls a severe episode of pneumonia that progressed to RSV, which was a frightening experience due to severe breathing difficulties. She has since received the RSV vaccine.  There is a family history of hearing issues, with her father and grandfather experiencing significant hearing loss. She has been advised to get hearing aids but has not received them due to insurance issues.      Review of systems:  10pt ROS negative unless noted above in HPI  Past medical/social/surgical/family history have been reviewed and are unchanged unless specifically indicated below.  No changes  Medication List: Current Outpatient Medications  Medication Sig Dispense Refill   acetaminophen  (TYLENOL ) 325 MG tablet Take by mouth.     albuterol  (VENTOLIN  HFA) 108 (90 Base) MCG/ACT inhaler INHALE 2 PUFFS BY MOUTH EVERY 4 TO 6 HOURS AS NEEDED FOR COUGH OR WHEEZING OR SHORTNESS OF BREATH OR CHEST TIGHTNESS 8 g 2   allopurinol (ZYLOPRIM) 100 MG tablet Take 100 mg by mouth daily.     Ascorbic Acid  (VITAMIN C PO) Take 1,000 mg by mouth daily.      azelastine  (OPTIVAR ) 0.05 % ophthalmic solution Place 1 drop into both eyes 2 (two) times daily. 6 mL 5   B  Complex Vitamins (VITAMIN B COMPLEX PO) Take 1 tablet by mouth daily.      Cholecalciferol (VITAMIN D3) 125 MCG (5000 UT) CAPS Take 5,000 Units by mouth daily.     clonazePAM  (KLONOPIN ) 0.5 MG tablet Take by mouth.     colchicine 0.6 MG tablet Take 0.6 mg by mouth 2 (two) times daily as needed.     diclofenac Sodium (VOLTAREN) 1 % GEL diclofenac 1 % topical gel  APPLY STRIP TOPICALLY TO THE AFFECTED AREA TWICE DAILY AS  NEEDED FOR PAIN     EPINEPHrine  0.3 mg/0.3 mL IJ SOAJ injection Use as directed for life threatening allergic reactions only 2 each 3   ferrous sulfate  325 (65 FE) MG EC tablet Take 325 mg by mouth 3 (three) times daily with meals.     furosemide  (LASIX ) 40 MG tablet furosemide  40 mg tablet  TAKE 1 TABLET BY MOUTH EVERY MORNING AND ADDITIONAL 1 TABLET IN THE AFTERNOON AS NEEDED FOR SWELLING     gabapentin  (NEURONTIN ) 300 MG capsule Take by mouth 2 (two) times daily.     ipratropium (ATROVENT ) 0.06 % nasal spray 2 sprays in each nostril 3-4 times daily as needed 15 mL 5   ketoconazole (NIZORAL) 2 % shampoo Apply 1 application topically 2 (two) times a week.      lidocaine  (LIDODERM ) 5 % lidocaine  5 % topical patch     lisinopril  (ZESTRIL ) 10 MG tablet Take 1 tablet by mouth daily as needed.     Multiple Vitamins-Minerals (ZINC PO) Take by mouth.     NARCAN  4 MG/0.1ML LIQD nasal spray kit USE 1 SPRAY IN NOSE AS DIRECTED     oxyCODONE -acetaminophen  (PERCOCET) 10-325 MG tablet TAKE 1 TABLET BY MOUTH 6 TIMES A DAY     QUEtiapine (SEROQUEL) 50 MG tablet Take by mouth.     tiZANidine (ZANAFLEX) 4 MG tablet Take 4 mg by mouth at bedtime.      triamcinolone  cream (KENALOG ) 0.5 % Apply topically 2 (two) times daily.     varenicline (CHANTIX) 1 MG tablet Take 1 mg by mouth daily.     Albuterol -Budesonide (AIRSUPRA ) 90-80 MCG/ACT AERO Inhale 2 puffs into the lungs every 4 (four) hours as needed. 10.7 g 2   azelastine  (ASTELIN ) 0.1 % nasal spray Place 2 sprays into both nostrils 2 (two) times daily as needed for rhinitis or allergies. 30 mL 5   Carbinoxamine  Maleate (RYVENT ) 6 MG TABS Take by mouth.     Carbinoxamine  Maleate (RYVENT ) 6 MG TABS Take 1 tablet (6 mg total) by mouth 2 (two) times daily. 60 tablet 5   fluticasone  (FLONASE ) 50 MCG/ACT nasal spray Place 2 sprays into both nostrils 2 (two) times daily as needed for allergies. 16 g 5   montelukast  (SINGULAIR ) 10 MG tablet Take 1 tablet (10 mg total)  by mouth daily. 30 tablet 5   Current Facility-Administered Medications  Medication Dose Route Frequency Provider Last Rate Last Admin   triamcinolone  acetonide (KENALOG ) 10 MG/ML injection 10 mg  10 mg Other Once Stover, Titorya, DPM       triamcinolone  acetonide (KENALOG ) 10 MG/ML injection 10 mg  10 mg Other Once Stover, Titorya, DPM         Known medication allergies: Allergies  Allergen Reactions   Penicillins Anaphylaxis and Other (See Comments)    Has patient had a PCN reaction causing immediate rash, facial/tongue/throat swelling, SOB or lightheadedness with hypotension: Yes Has patient had a PCN reaction causing severe rash involving  mucus membranes or skin necrosis: No Has patient had a PCN reaction that required hospitalization: No Has patient had a PCN reaction occurring within the last 10 years: No If all of the above answers are NO, then may proceed with Cephalosporin use.   Prozac [Fluoxetine Hcl] Hives, Swelling and Other (See Comments)    Facial swelling   Diphenhydramine -Pe-Apap Other (See Comments)     Physical examination: Blood pressure 112/66, pulse 62, resp. rate 16, SpO2 95%.  General: Alert, interactive, in no acute distress. HEENT: Edentulous, PERRLA, TMs pearly gray, turbinates non-edematous without discharge, post-pharynx non erythematous. Neck: Supple without lymphadenopathy. Lungs: Clear to auscultation without wheezing, rhonchi or rales. {no increased work of breathing. CV: Normal S1, S2 without murmurs. Abdomen: Nondistended, nontender. Skin: Warm and dry, without lesions or rashes. Extremities:  No clubbing, cyanosis or edema. Neuro:   Grossly intact.  Diagnostics/Labs: None today  Assessment and plan: Allergies   - continue avoidance measures for grass pollens, dust mite and cockroach.    - Continue Ryvent  twice a day   - Continue Singulair  daily   - perform nasal saline rinses to help clean/flush out the nose and sinus tract.  This also  helps your nasal sprays work better if nose is cleaning.   - for nasal drainage use Astelin /Azelastine  2 sprays each nostril twice a day as needed   - use Flonase  2 sprays each nostril daily for the next 1 to 2 weeks for nasal congestion control.  Use for 1-2 weeks at a time if having nasal congestion or sinus pressure.     - for itchy/watery eyes use Optivar  1 drop each eye twice a day as needed.   - continue allergen immunotherapy for pollens per schedule.  Have access to epinephrine  device on injections days   - environmental allergy testing only shows continued grass pollen allergy  COPD with asthma  - under management with pulmonology at [facility]  - after inhaler use Rinse/gargle mouth  - continue Singulair  10mg  daily at bedtime  - use Airsupra  2 puffs every 4 hours as needed for cough/wheeze/shortness of breath/chest tightness.    Control goals:  Full participation in all desired activities (may need albuterol  before activity) Albuterol  use two time or less a week on average (not counting use with activity) Cough interfering with sleep two time or less a month Oral steroids no more than once a year No hospitalizations  Tobacco use  - continue your course to quitting!  Now using Chantix.  Continue to decrease your daily cigarette use.   You are doing great with this.  Keep up the good work.   Food intolerance  - skin testing to milk has been negative thus confirming dairy intolerance  - continue avoidance of milk/ice cream to prevent GI symptoms   Follow-up 6 months or sooner if needed  I appreciate the opportunity to take part in Diana Oneill's care. Please do not hesitate to contact me with questions.  Sincerely,   Danita Brain, MD Allergy/Immunology [facility]

## 2024-01-01 ENCOUNTER — Other Ambulatory Visit: Payer: Self-pay | Admitting: Allergy

## 2024-01-15 ENCOUNTER — Ambulatory Visit (INDEPENDENT_AMBULATORY_CARE_PROVIDER_SITE_OTHER): Admitting: *Deleted

## 2024-01-15 DIAGNOSIS — J309 Allergic rhinitis, unspecified: Secondary | ICD-10-CM | POA: Diagnosis not present

## 2024-02-11 ENCOUNTER — Ambulatory Visit (INDEPENDENT_AMBULATORY_CARE_PROVIDER_SITE_OTHER): Admitting: *Deleted

## 2024-02-11 DIAGNOSIS — J309 Allergic rhinitis, unspecified: Secondary | ICD-10-CM

## 2024-03-06 ENCOUNTER — Ambulatory Visit (INDEPENDENT_AMBULATORY_CARE_PROVIDER_SITE_OTHER): Admitting: *Deleted

## 2024-03-06 DIAGNOSIS — J309 Allergic rhinitis, unspecified: Secondary | ICD-10-CM

## 2024-03-06 DIAGNOSIS — J3089 Other allergic rhinitis: Secondary | ICD-10-CM | POA: Diagnosis not present

## 2024-04-15 ENCOUNTER — Ambulatory Visit

## 2024-04-15 DIAGNOSIS — J309 Allergic rhinitis, unspecified: Secondary | ICD-10-CM

## 2024-04-15 DIAGNOSIS — J302 Other seasonal allergic rhinitis: Secondary | ICD-10-CM | POA: Diagnosis not present

## 2024-05-09 DIAGNOSIS — J301 Allergic rhinitis due to pollen: Secondary | ICD-10-CM | POA: Diagnosis not present

## 2024-05-09 NOTE — Progress Notes (Signed)
 VIALS MADE ON 05/09/24

## 2024-05-12 DIAGNOSIS — J3089 Other allergic rhinitis: Secondary | ICD-10-CM | POA: Diagnosis not present

## 2024-05-12 DIAGNOSIS — J302 Other seasonal allergic rhinitis: Secondary | ICD-10-CM | POA: Diagnosis not present

## 2024-05-15 ENCOUNTER — Ambulatory Visit: Admitting: *Deleted

## 2024-05-15 DIAGNOSIS — J302 Other seasonal allergic rhinitis: Secondary | ICD-10-CM | POA: Diagnosis not present

## 2024-06-04 ENCOUNTER — Ambulatory Visit

## 2024-06-04 DIAGNOSIS — J302 Other seasonal allergic rhinitis: Secondary | ICD-10-CM | POA: Diagnosis not present

## 2024-06-17 ENCOUNTER — Ambulatory Visit: Admitting: Allergy
